# Patient Record
Sex: Female | Born: 1935 | Race: Black or African American | Hispanic: No | Smoking: Never smoker
Health system: Southern US, Community
[De-identification: ages and names within clinical notes are randomized; demographics above are authoritative.]

## PROBLEM LIST (undated history)

## (undated) DIAGNOSIS — K589 Irritable bowel syndrome without diarrhea: Secondary | ICD-10-CM

## (undated) DIAGNOSIS — N289 Disorder of kidney and ureter, unspecified: Secondary | ICD-10-CM

## (undated) DIAGNOSIS — M1990 Unspecified osteoarthritis, unspecified site: Secondary | ICD-10-CM

## (undated) DIAGNOSIS — C801 Malignant (primary) neoplasm, unspecified: Secondary | ICD-10-CM

## (undated) DIAGNOSIS — F419 Anxiety disorder, unspecified: Secondary | ICD-10-CM

## (undated) DIAGNOSIS — I10 Essential (primary) hypertension: Secondary | ICD-10-CM

## (undated) DIAGNOSIS — F411 Generalized anxiety disorder: Secondary | ICD-10-CM

## (undated) HISTORY — DX: Anxiety disorder, unspecified: F41.9

## (undated) HISTORY — DX: Irritable bowel syndrome, unspecified: K58.9

## (undated) HISTORY — PX: CARDIAC CATHETERIZATION: SHX172

## (undated) HISTORY — DX: Unspecified osteoarthritis, unspecified site: M19.90

## (undated) HISTORY — PX: ABDOMINAL HYSTERECTOMY: SHX81

---

## 2001-04-04 ENCOUNTER — Ambulatory Visit (HOSPITAL_COMMUNITY): Admission: RE | Admit: 2001-04-04 | Discharge: 2001-04-04 | Payer: Self-pay | Admitting: Ophthalmology

## 2002-10-15 ENCOUNTER — Ambulatory Visit (HOSPITAL_COMMUNITY): Admission: RE | Admit: 2002-10-15 | Discharge: 2002-10-15 | Payer: Self-pay | Admitting: *Deleted

## 2002-10-15 ENCOUNTER — Encounter: Payer: Self-pay | Admitting: Internal Medicine

## 2002-10-31 ENCOUNTER — Encounter: Payer: Self-pay | Admitting: Internal Medicine

## 2002-10-31 ENCOUNTER — Ambulatory Visit (HOSPITAL_COMMUNITY): Admission: RE | Admit: 2002-10-31 | Discharge: 2002-10-31 | Payer: Self-pay | Admitting: Internal Medicine

## 2002-12-11 ENCOUNTER — Other Ambulatory Visit: Admission: RE | Admit: 2002-12-11 | Discharge: 2002-12-11 | Payer: Self-pay | Admitting: Ophthalmology

## 2003-03-12 ENCOUNTER — Ambulatory Visit (HOSPITAL_COMMUNITY): Admission: RE | Admit: 2003-03-12 | Discharge: 2003-03-12 | Payer: Self-pay | Admitting: Internal Medicine

## 2003-03-12 ENCOUNTER — Encounter: Payer: Self-pay | Admitting: Internal Medicine

## 2003-11-27 ENCOUNTER — Encounter (HOSPITAL_COMMUNITY): Admission: RE | Admit: 2003-11-27 | Discharge: 2003-11-28 | Payer: Self-pay | Admitting: Internal Medicine

## 2003-12-11 ENCOUNTER — Inpatient Hospital Stay (HOSPITAL_COMMUNITY): Admission: EM | Admit: 2003-12-11 | Discharge: 2003-12-13 | Payer: Self-pay | Admitting: Emergency Medicine

## 2004-03-23 ENCOUNTER — Ambulatory Visit (HOSPITAL_COMMUNITY): Admission: RE | Admit: 2004-03-23 | Discharge: 2004-03-23 | Payer: Self-pay | Admitting: Ophthalmology

## 2004-10-13 ENCOUNTER — Ambulatory Visit (HOSPITAL_COMMUNITY): Admission: RE | Admit: 2004-10-13 | Discharge: 2004-10-13 | Payer: Self-pay | Admitting: Internal Medicine

## 2005-06-18 ENCOUNTER — Ambulatory Visit (HOSPITAL_COMMUNITY): Admission: RE | Admit: 2005-06-18 | Discharge: 2005-06-18 | Payer: Self-pay | Admitting: Internal Medicine

## 2006-12-21 ENCOUNTER — Ambulatory Visit (HOSPITAL_COMMUNITY): Admission: RE | Admit: 2006-12-21 | Discharge: 2006-12-21 | Payer: Self-pay | Admitting: Internal Medicine

## 2007-04-01 ENCOUNTER — Emergency Department (HOSPITAL_COMMUNITY): Admission: EM | Admit: 2007-04-01 | Discharge: 2007-04-01 | Payer: Self-pay | Admitting: Emergency Medicine

## 2008-06-25 ENCOUNTER — Ambulatory Visit (HOSPITAL_COMMUNITY): Admission: RE | Admit: 2008-06-25 | Discharge: 2008-06-25 | Payer: Self-pay | Admitting: Internal Medicine

## 2010-11-07 ENCOUNTER — Encounter: Payer: Self-pay | Admitting: Internal Medicine

## 2011-01-11 ENCOUNTER — Emergency Department (HOSPITAL_COMMUNITY)
Admission: EM | Admit: 2011-01-11 | Discharge: 2011-01-11 | Disposition: A | Discharge status: Home / Self Care | Payer: Medicare Other | Attending: Emergency Medicine | Admitting: Emergency Medicine

## 2011-01-11 DIAGNOSIS — I10 Essential (primary) hypertension: Secondary | ICD-10-CM | POA: Insufficient documentation

## 2011-01-11 DIAGNOSIS — R109 Unspecified abdominal pain: Secondary | ICD-10-CM | POA: Insufficient documentation

## 2011-01-11 DIAGNOSIS — K5289 Other specified noninfective gastroenteritis and colitis: Secondary | ICD-10-CM | POA: Insufficient documentation

## 2011-01-11 LAB — DIFFERENTIAL
Eosinophils Absolute: 0 10*3/uL (ref 0.0–0.7)
Eosinophils Relative: 0 % (ref 0–5)
Lymphs Abs: 0.9 10*3/uL (ref 0.7–4.0)
Monocytes Absolute: 0.2 10*3/uL (ref 0.1–1.0)
Monocytes Relative: 3 % (ref 3–12)

## 2011-01-11 LAB — CBC
MCH: 30.3 pg (ref 26.0–34.0)
MCV: 91 fL (ref 78.0–100.0)
Platelets: 296 10*3/uL (ref 150–400)
RDW: 13.6 % (ref 11.5–15.5)

## 2011-01-11 LAB — BASIC METABOLIC PANEL
BUN: 15 mg/dL (ref 6–23)
Creatinine, Ser: 0.81 mg/dL (ref 0.4–1.2)
GFR calc non Af Amer: >60 mL/min (ref >60)

## 2011-03-05 NOTE — H&P (Signed)
NAME:  Faith Lee, Faith Lee                     ACCOUNT NO.:  [id number]   MEDICAL RECORD NO.:  [id number]                   PATIENT TYPE:  INP   LOCATION:  IC10                                 FACILITY:  [facility]   PHYSICIAN:  Edward L. Luan Pulling, M.D.             DATE OF BIRTH:  Apr 21, 1931   DATE OF ADMISSION:  12/11/2003  DATE OF DISCHARGE:                                HISTORY & PHYSICAL   REASON FOR ADMISSION:  Chest discomfort.   HISTORY OF PRESENT ILLNESS:  Faith Lee is a 77 year old patient of Dr.  Ria Comment who has not had any previous history of any sort of heart disease,  but who had a stress test done about two weeks ago which showed what  appeared to be some inferior ischemia.  She says that she was in her usual  state of normal health when in the night last night prior to admission, she  developed increased heart rate and a feeling of fullness which she does not  describe as a pain.  She did say that she had more stress and anxiety than  normal yesterday.  She took aspirin and antiacid.  This did not help.  She  said that her blood pressure was about 170.  Pulse was about 160.  She  stayed up worried about this through most of the night and eventually came  to the emergency room.  When the EMS service came to get her, she had  nitroglycerin x1 and aspirin, and the sensation was relieved.  She has a  previous history of hypertension and anxiety.  She says that occasionally  her potassium level gets low.  She does have a history of irritable bowel  syndrome as well.  There is mentioned a cardiac catheterization 13 years ago  at [facility], but apparently there is no report on that as yet.   SOCIAL HISTORY:  She does not smoke.  She does not drink any alcohol.  She  lives at home with her husband.   FAMILY HISTORY:  A very strong history of coronary disease.  Her mother died  in her 39's of congestive heart failure.  Her father had myocardial  infarction.   REVIEW OF SYSTEMS:   Except as mentioned, is negative.   PHYSICAL EXAMINATION:  VITAL SIGNS:  Blood pressure 121/57, pulse 78,  temperature 97.5, respirations 20.  HEENT:  Pupils are equal, round and reactive to light and accommodation.  Nose and throat are clear.  NECK:  Supple without masses.  CHEST:  Clear without wheezes.  HEART:  Regular.  ABDOMEN:  Soft.  EXTREMITIES:  Showed no edema.  NEUROLOGIC:  CNS examination is grossly intact.   EKG shows nonspecific ST-T wave abnormalities.   LABORATORY DATA:  White count 7600, hemoglobin 13, platelets 323,  electrolytes are normal.  BUN 7, creatinine 0.9.   Cardiac enzymes, CK 157, MB of 2.6, troponin of 0.05.  Clotting is normal.  There is  a junctional tachycardia on her EKG.   ASSESSMENT:  She has chest pain which is not entirely typical, but certainly  she had markedly-elevated heart rate and now has what looks like a  junctional tachycardia, an abnormal graded exercise test, a strong  postoperative family history of coronary disease and hypertension.   PLAN:  The plan is for her to be set up for a probable cardiac  catheterization and possibly EP evaluation as well.     ___________________________________________                                         Jasper Loser. Luan Pulling, M.D.   Marjean Donna  D:  12/11/2003  T:  12/11/2003  Job:  [id number]

## 2011-03-05 NOTE — Cardiovascular Report (Signed)
NAME:  Faith Lee, Faith Lee                     ACCOUNT NO.:  [id number]   MEDICAL RECORD NO.:  [id number]                   PATIENT TYPE:  INP   LOCATION:  [id number]                                 FACILITY:  [facility]   PHYSICIAN:  Eustace Quail, M.D.                  DATE OF BIRTH:  1931-10-10   DATE OF PROCEDURE:  12/12/2003  DATE OF DISCHARGE:  12/13/2003                              CARDIAC CATHETERIZATION   CLINICAL HISTORY:  [name] is 77 years old and was admitted through  [facility] Emergency Room with chest pain and palpitations.  On admission,  she was found to have an SVT with a rate of about 130 with no evidence P  waves.  This converted after a number of hours and she was scheduled for  evaluation with angiography here.  She had a previous Cardiolite scan  performed on November 28, 2003 which showed an ejection fraction of 72%, no  ischemia and inferior lateral ST changes.   PROCEDURE:  The procedure was performed via the right femoral artery using  arterial sheath and 6 French preformed coronary catheters. A front wall  arterial puncture was performed and Omnipaque contrast was used.  A distal  aortogram was performed to rule out renal vascular causes for her  hypertension.  The patient tolerated the procedure well and left the  laboratory in satisfactory condition.   RESULTS:  Left main coronary artery:  The left main coronary was free of  significant disease.   Left anterior descending artery:  The left anterior descending artery gave  rise to two diagonal branches and a septal perforator.  These and the LAD  proper are free of significant disease.   Circumflex artery:  The circumflex artery gave rise to a large ramus branch,  marginal branch and a posterior lateral branch.  These vessels were free of  significant disease.   Right coronary artery:  The right coronary artery was a small vessel that  gave rise to a right ventricular branch, posterior descending branch  and a  very small posterior lateral branch.  These vessels were free of significant  disease.   LEFT VENTRICULOGRAM:  The left ventriculogram performed in the RAO  projection showed good wall motion with no areas of hypokinesis.  The  estimated ejection fraction was 60%.   DISTAL AORTOGRAM:  Distal aortogram was performed which showed patent renal  arteries and no significant aortoiliac obstruction.  There were two renal  arteries on the right.   The aortic pressure was 148/75, mean 104.  Left ventricular pressure was  148/14.   CONCLUSIONS:  Normal coronary angiography and left ventricular wall motion.   RECOMMENDATIONS:  Reassurance.  In view of these findings, I suspect that  most of the patient's symptoms are related to her SVT.  Will plan treatment  with beta blockers and if she has recurrence will consider ablation if the  rhythm appears suitable  for that.                                               Eustace Quail, M.D.    BB/MEDQ  D:  12/12/2003  T:  12/14/2003  Job:  [id number]   cc:   Paula Compton. Willey Blade, M.D.  [address]  [place] [address]  Fax: [place] Luan Pulling, M.D.  [address]  [place]  [place] [address]  Fax: [phone]   Scarlett Presto, M.D.  Fax: [phone]

## 2011-03-05 NOTE — Consult Note (Signed)
NAME:  Faith Lee, Faith Lee                     ACCOUNT NO.:  [id number]   MEDICAL RECORD NO.:  [id number]                   PATIENT TYPE:  INP   LOCATION:  IC10                                 FACILITY:  [facility]   PHYSICIAN:  Scarlett Presto, M.D.                DATE OF BIRTH:  [birth year]   DATE OF CONSULTATION:  DATE OF DISCHARGE:                                   CONSULTATION   HISTORY OF PRESENT ILLNESS:  [name] is a 77 year old woman with a  history of hypertension and a question of hyperlipidemia who presents today  with long-standing chest discomfort which suddenly worsened over the last  couple of hours associated with palpitations.  She presented to the ER.  The  onset of his symptoms was about 10:30 at rest last night.  She noted the  increased heart rate, fullness in her chest.  She thought it probably would  go away over time as most of her other episodes do.  However, she was  worried about this also being related to the second anniversary of the death  of her son.  So, at that point, she presented to the ER, was evaluated,  given some nitroglycerin with resolution of the discomfort.  Found on EKG in  the ER to have what looks to be junctional tachycardia. This also resolved  with the nitroglycerin.  She was given aspirin, and we were asked to see the  patient.  She is currently pain free, feels well.  Her heart rate is in the  80s and sinus currently.   MEDICATIONS:  1. Lorazepam which she took on a p.r.n. basis.  2. Hydrochlorothiazide.  3. Triamterene 37.5/25 once daily.  4. Potassium chloride 10 mEq daily.  5. Benicar at a dose she is not certain of.   PAST MEDICAL HISTORY:  1. Irritable bowel syndrome.  2. Anxiety.  3. Hypertension.  4. Heart catheterization about 13 years ago at [facility].  We have no records, but she tells me that she had no evidence     of coronary disease.   Interestingly, she was evaluated by Dr. Willey Blade last  week with an exercise  Cardiolite which she exercised only about 3 minutes, had ST segment  depression in the inferior lateral leads which was diagnostic, and had  perfusion study which showed no ischemia and a normal ejection fraction.  Review of those films by me shows that there may be some evidence of  anterior reversible defect which was interpreted as breast attenuation.  However, it appears more likely to be ischemia.   SOCIAL HISTORY:  She lives in [place] with her husband.  She  is married.  She has one daughter and one son who is deceased secondary to  colon cancer.  Her daughter is healthy, and I met her at the patient's  bedside.  She does not smoke cigarettes, does not  drink alcohol, does not  use drugs.  Is not on a diet.   FAMILY HISTORY:  Her mother died at age 22 of congestive heart failure.  Father died at age 56 of a myocardial infarction, and died suddenly cardiac  __________.  She has three brothers and three sisters, none of them have  coronary disease.   REVIEW OF SYSTEMS:  She does have the situational anxiety related to the  anniversary of the death of her son.  She does have some mild PND and  orthopnea which occur relatively rarely.  She has nausea, frequently  associated with her irritable bowel syndrome, otherwise, her review of  systems is negative.   PHYSICAL EXAMINATION:  GENERAL APPEARANCE:  She is an absolutely delightful,  articular African American female in no apparent distress.  She was alert  and oriented x4.  VITAL SIGNS:  Heart rate is 76 and regular in sinus, respiratory rate 20,  blood pressure 121/57.  HEENT:  Unremarkable.  NECK:  Supple with no jugular venous distention or carotid bruits.  CHEST:  Clear to auscultation.  CARDIAC:  Nondisplaced point of maximum impulse with no lifts or thrills.  First and second heart sound are normal.  There is no third or fourth heart  sound.  No murmurs are noted.  ABDOMEN:  Soft,  nontender, normoactive bowel sounds.  GU/RECTAL:  Deferred.  EXTREMITIES:  Without significant cyanosis, clubbing or edema.  She has 2+  pulses throughout.  No femoral bruits.  MUSCULOSKELETAL:  Unrevealing.  NEUROLOGICAL:  Nonfocal.  BREAST:  Deferred.   CHEST X-RAY:  Question of cardiomegaly, but otherwise, no acute disease.   ELECTROCARDIOGRAM:  First electrocardiogram shows junctional tachycardia  rate of 133 with normal axis, nonspecific ST-T wave changes with evidence of  ST segment depression in the anterolateral leads.  No evidence of left  ventricular hypertrophy.  Repeat EKG after the nitroglycerin shows sinus  rhythm with left atrial enlargement, evidence of poor R wave progression  across the anterior precordium, nonspecific ST-T wave changes, question of  an old inferior wall MI was poor quality study.   LABORATORY DATA:  White blood cell count 7.6, H&H 13 and 39, platelets  323,000.  Sodium 139, potassium 3.7, chloride 102, bicarbonate 31, BUN 7,  creatinine 0.9, blood sugar 112.  Single set of cardiac enzymes:  CK 157, MB  2.6, troponin 0.05, PTT 34, INR 1.2, PT 14.4.   ASSESSMENT:  1. Chest pain which is atypical with a recent submaximal stress test which     is nondiagnostic but concerning.  2. Junctional tachycardia with chest pain and ST segment depression.  3. Hypertension.  4. Coronary artery disease family history.   PLAN:  My plan is to put this patient on aspirin and Lovenox.  I think it is  probably reasonable to transfer her to [facility] for heart  catheterization.  If this is unrevealing, I think an electrophysiology  evaluation would be reasonable.  She probably needs a statin agent along  with her aspirin and Lovenox.  I think at this point, her blood pressure  appears to be well controlled.  I would not add any beta blocker or ACE  inhibitor at this time.  I will just leave her on her  hydrochlorothiazide.    ___________________________________________  Scarlett Presto, M.D.   JH/MEDQ  D:  12/11/2003  T:  12/12/2003  Job:  [id number]

## 2011-03-05 NOTE — Group Therapy Note (Signed)
NAME:  Faith Lee, Faith Lee                     ACCOUNT NO.:  [id number]   MEDICAL RECORD NO.:  [id number]                   PATIENT TYPE:  OUT   LOCATION:  RAD                                  FACILITY:  [facility]   PHYSICIAN:  Paula Compton. Willey Blade, M.D.                  DATE OF BIRTH:  07-22-1931   DATE OF PROCEDURE:  DATE OF DISCHARGE:                                   PROGRESS NOTE   HISTORY OF PRESENT ILLNESS:  [name] exercised 3 minutes, 32 seconds (32  seconds in stage II of the Bruce protocol) sustaining a maximal heart rate  of 165 (110% of the age predicted maximal heart rate) at a work load of 4.6  mets and discontinued exercise due to fatigue.  There were no symptoms of  chest pain.  There were no arrhythmias.  There was 1 mm downsloping ST  segment depression noted in the inferolateral leads.  The baseline EKG  revealed normal sinus rhythm/sinus tachycardia at 102 beats per minute.   IMPRESSION:  Evidence of inferolateral ischemia.  Cardiolite images pending.      ___________________________________________                                            Paula Compton. Willey Blade, M.D.   ROF/MEDQ  D:  11/27/2003  T:  11/27/2003  Job:  [id number]

## 2011-03-05 NOTE — Procedures (Signed)
NAME:  Faith Lee, Faith Lee                     ACCOUNT NO.:  [id number]   MEDICAL RECORD NO.:  [id number]                   PATIENT TYPE:  INP   LOCATION:  IC10                                 FACILITY:  [facility]   PHYSICIAN:  Edward L. Luan Pulling, M.D.             DATE OF BIRTH:  1931/05/21   DATE OF PROCEDURE:  DATE OF DISCHARGE:                                EKG INTERPRETATION   PROCEDURE #1:  Electrocardiogram December 11, 2003, at 1304.   FINDINGS:  1. The rhythm appears to be sinus rhythm with a rate in the 70's.  2. There is probable left atrial enlargement.  3. Slow R wave progression across the precordium may indicate a previous     anterior myocardial infarction.  4. There are T wave abnormalities which are diffuse but nonspecific.   IMPRESSION:  Abnormal electrocardiogram.   PROCEDURE #2:  Electrocardiogram, December 11, 2003, at 0926.   FINDINGS:  1. The rhythm is what look like a junctional tachycardia with a rate of     about 130.  2. There are ST-T wave abnormalities which are diffuse but nonspecific.      ___________________________________________                                            Jasper Loser. Luan Pulling, M.D.   Marjean Donna  D:  12/11/2003  T:  12/12/2003  Job:  [id number]

## 2011-03-05 NOTE — Discharge Summary (Signed)
NAMEKIMM, HYNEK                       ACCOUNT NO.:  [id number]   MEDICAL RECORD NO.:  [id number]                  PATIENT TYPE:   LOCATION:                                       FACILITY:   PHYSICIAN:  Paula Compton. Willey Blade, M.D.                  DATE OF BIRTH:   DATE OF ADMISSION:  DATE OF DISCHARGE:                                 DISCHARGE SUMMARY   DISCHARGE DIAGNOSES:  1. Chest pain.  2. Junctional tachycardia.  3. Hypertension.  4. Anxiety.   HOSPITAL COURSE:  This patient is a 77 year old African American female who  presented with palpitations and atypical chest pain.  She was found to be in  a junctional tachycardia initially.  She converted to a normal sinus rhythm.  Her cardiac enzymes were normal with a CK of 157 and a troponin I of 0.05.  Her electrolytes were normal.  Her chest x-ray revealed cardiomegaly.  She  was hospitalized in the CCU.  She was seen in cardiology consultation by Dr.  Wilhemina Cash.  She had undergone a negative Cardiolite stress test last week, but  had limited exercise tolerance.  The decision was made to transfer her to  [facility] for cardiac catheterization and further evaluation.   MEDICATIONS:  Her home medications had been:  1. Benicar 20 mg daily.  2. Hydrochlorothiazide 25 mg daily.  3. KCl 10 mEq daily.  4. Lorazepam 1 mg 1/2 to 1 t.i.d. p.r.n.   Since hospitalization, Lovenox was added at 1 mg/kg subcu q.12h.     ___________________________________________                                         Paula Compton. Willey Blade, M.D.   ROF/MEDQ  D:  12/12/2003  T:  12/12/2003  Job:  [id number]

## 2011-03-05 NOTE — Discharge Summary (Signed)
NAME:  Faith Lee, Faith Lee                     ACCOUNT NO.:  [id number]   MEDICAL RECORD NO.:  [id number]                   PATIENT TYPE:  INP   LOCATION:  6533                                 FACILITY:  [facility]   PHYSICIAN:  Junious Silk, M.D. [facility]         DATE OF BIRTH:  [birth year]   DATE OF ADMISSION:  12/12/2003  DATE OF DISCHARGE:  12/13/2003                           DISCHARGE SUMMARY - REFERRING   DISCHARGE DIAGNOSES:  1. Chest pain felt to be noncardiac.  2. Shoulder pain thought to be musculoskeletal in nature.  3. Hypertension under medical therapy.   HOSPITAL COURSE:  Ms. Burgen is a 77 year old female with known history of  coronary artery disease who presented to [facility] ER with complaints of  chest pain and palpitations.  She self medicated herself with aspirin and  antacid without relief.  Blood pressure at home was 170 with pulse of 158.  This occurred during the entire night, and around 5 a.m., she called EMS.  She had a stress Cardiolite on November 28, 2003, which revealed an EF of  72% with normal wall motion, no ischemia, mild breast attenuation.  Her EKG  does show inferolateral ischemia with exercise.  For that reason, she was  transferred to [facility] for catheterization.   Her coronaries were normal angiographically.  Her EF was 60% with no wall  motion abnormalities. At this point because of her palpitations, we did  place her on a beta blocker, and we did stop her Benicar.  Hopefully between  the Toprol and Maxzide, her blood pressure will remain stable as it has  during this hospitalization.  She is to resume her potassium.   PHYSICAL EXAMINATION:  VITAL SIGNS:  Upon discharge, her blood pressure is  106/66, pulse 70, O2 saturation 98% on room air.  LUNGS:  Lungs are clear to auscultation bilaterally  HEART:  Regular rate and rhythm with a normal S1 and S2.  S4 is present.  No  peripheral edema.  EXTREMITIES:  Right groin has no bruit, no  hematoma.   LABORATORY DATA:  Essentially, labs have been reviewed by Dr. Vicenta Aly and  are normal.   DISCHARGE MEDICATIONS:  1. Triamterene/HCTZ 37.5/25 one tablet daily.  2. Toprol XL 50 mg a day.  3. Potassium 10 mEq a day.   DISCHARGE INSTRUCTIONS:  1. The patient is to stop Benicar.  2. Tylenol: May utilize 1 to 2 tablets every 6 hours as needed for pain.  3. No strenuous activity or heavy lifting for two days, then gradually     increase activity.  4. Remain on low-fat diet.  5. Clear catheterization site with soap and water, no scrubbing.  6. Call for questions or concerns.  7. She is to follow up with Dr. Wilhemina Cash on December 20, 2003, at 3:30 p.m.  8. She is to follow up with her primary care physician for further workup of     the shoulder pain which we  feel is musculoskeletal in nature.  9. She may utilize Motrin for this.      Joesphine Bare, P.A. [facility]                      Junious Silk, M.D. [facility]    LB/MEDQ  D:  12/13/2003  T:  12/13/2003  Job:  [id number]   cc:   Scarlett Presto, M.D.  Fax: [place] Willey Blade, M.D.  [address]  [place]  [place] [address]  Fax: [phone]

## 2012-01-05 ENCOUNTER — Encounter (HOSPITAL_COMMUNITY): Payer: Self-pay | Admitting: *Deleted

## 2012-01-05 ENCOUNTER — Emergency Department (HOSPITAL_COMMUNITY): Payer: Medicare Other

## 2012-01-05 ENCOUNTER — Other Ambulatory Visit: Payer: Self-pay

## 2012-01-05 ENCOUNTER — Emergency Department (HOSPITAL_COMMUNITY)
Admission: EM | Admit: 2012-01-05 | Discharge: 2012-01-05 | Disposition: A | Discharge status: Home / Self Care | Payer: Medicare Other | Attending: Emergency Medicine | Admitting: Emergency Medicine

## 2012-01-05 DIAGNOSIS — Z9889 Other specified postprocedural states: Secondary | ICD-10-CM | POA: Insufficient documentation

## 2012-01-05 DIAGNOSIS — R209 Unspecified disturbances of skin sensation: Secondary | ICD-10-CM | POA: Insufficient documentation

## 2012-01-05 DIAGNOSIS — Z79899 Other long term (current) drug therapy: Secondary | ICD-10-CM | POA: Insufficient documentation

## 2012-01-05 DIAGNOSIS — R531 Weakness: Secondary | ICD-10-CM

## 2012-01-05 DIAGNOSIS — R110 Nausea: Secondary | ICD-10-CM | POA: Insufficient documentation

## 2012-01-05 DIAGNOSIS — R5381 Other malaise: Secondary | ICD-10-CM | POA: Insufficient documentation

## 2012-01-05 DIAGNOSIS — R109 Unspecified abdominal pain: Secondary | ICD-10-CM | POA: Insufficient documentation

## 2012-01-05 DIAGNOSIS — I10 Essential (primary) hypertension: Secondary | ICD-10-CM | POA: Insufficient documentation

## 2012-01-05 HISTORY — DX: Essential (primary) hypertension: I10

## 2012-01-05 LAB — BASIC METABOLIC PANEL
CO2: 31 mEq/L (ref 19–32)
Calcium: 9.5 mg/dL (ref 8.4–10.5)
GFR calc Af Amer: 75 mL/min — ABNORMAL LOW (ref >90)
GFR calc non Af Amer: 65 mL/min — ABNORMAL LOW (ref >90)
Sodium: 140 mEq/L (ref 135–145)

## 2012-01-05 LAB — CBC
MCV: 91.6 fL (ref 78.0–100.0)
Platelets: 298 10*3/uL (ref 150–400)
RDW: 13.6 % (ref 11.5–15.5)
WBC: 5.5 10*3/uL (ref 4.0–10.5)

## 2012-01-05 LAB — DIFFERENTIAL
Basophils Absolute: 0 10*3/uL (ref 0.0–0.1)
Basophils Relative: 1 % (ref 0–1)
Eosinophils Relative: 2 % (ref 0–5)
Lymphocytes Relative: 44 % (ref 12–46)
Neutro Abs: 2.5 10*3/uL (ref 1.7–7.7)

## 2012-01-05 LAB — POCT I-STAT TROPONIN I: Troponin i, poc: 0 ng/mL (ref 0.00–0.08)

## 2012-01-05 MED ORDER — ONDANSETRON 8 MG PO TBDP
8.0000 mg | ORAL_TABLET | Freq: Once | ORAL | Status: AC
  Administered 2012-01-05: 8 mg via ORAL
  Filled 2012-01-05: qty 1

## 2012-01-05 NOTE — ED Provider Notes (Signed)
History     CSN: [id number]  Arrival date & time 01/05/12  1155   First MD Initiated Contact with Patient 01/05/12 1210      Chief Complaint  Patient presents with  . Abdominal Pain    (Consider location/radiation/quality/duration/timing/severity/associated sxs/prior treatment) HPI Comments: Patient arrives to the ED by EMS with complaint of upper abdominal pain and numbness to her right face. She states that she was at her dental appointment just before arrival and received an injection of lidocaine prior to a dental cleaning. She states she suddenly became nauseous, and had pain to the right side of her chest that radiated to her upper abdomen. She also reports that her right face and cheek became numb. She denies sweating or vomiting. She also denies any shortness of breath.  Patient is a 78 y.o. female presenting with abdominal pain. The history is provided by the patient. No language interpreter was used.  Abdominal Pain The primary symptoms of the illness include abdominal pain and nausea. The primary symptoms of the illness do not include fever, fatigue, shortness of breath, vomiting, diarrhea, hematemesis, hematochezia or dysuria. Episode onset: Less than one hour prior to ED arrival. The onset of the illness was sudden. The problem has been gradually improving.  Associated with: Associated with an Injection of lidocaine. The patient states that she believes she is currently not pregnant. The patient has not had a change in bowel habit. Risk factors for an acute abdominal problem include being elderly. Symptoms associated with the illness do not include chills, diaphoresis, heartburn, constipation, urgency, hematuria, frequency or back pain.    Past Medical History  Diagnosis Date  . Hypertension     Past Surgical History  Procedure Date  . Abdominal hysterectomy     History reviewed. No pertinent family history.  History  Substance Use Topics  . Smoking status: Never  Smoker   . Smokeless tobacco: Not on file  . Alcohol Use: No    OB History    Grav Para Term Preterm Abortions TAB SAB Ect Mult Living                  Review of Systems  Constitutional: Negative for fever, chills, diaphoresis and fatigue.  HENT: Negative for facial swelling, trouble swallowing and neck pain.   Respiratory: Negative for chest tightness and shortness of breath.   Cardiovascular: Positive for chest pain. Negative for palpitations and leg swelling.  Gastrointestinal: Positive for nausea and abdominal pain. Negative for heartburn, vomiting, diarrhea, constipation, hematochezia, abdominal distention and hematemesis.  Genitourinary: Negative for dysuria, urgency, frequency, hematuria and difficulty urinating.  Musculoskeletal: Negative for back pain.  Skin: Negative.   Neurological: Positive for speech difficulty, weakness, light-headedness and numbness.  All other systems reviewed and are negative.    Allergies  Review of patient's allergies indicates no known allergies.  Home Medications  No current outpatient prescriptions on file.  BP 141/81  Pulse 90  Temp(Src) 97.8 F (36.6 C) (Oral)  Resp 20  SpO2 100%  Physical Exam  Nursing note and vitals reviewed. Constitutional: She is oriented to person, place, and time. She appears well-developed and well-nourished. No distress.  HENT:  Head: Normocephalic and atraumatic.  Mouth/Throat: Oropharynx is clear and moist.       No focal neuro deficits to her face. No facial drooping.  Eyes: EOM are normal. Pupils are equal, round, and reactive to light.  Neck: Normal range of motion. Neck supple.  Cardiovascular: Normal rate, regular rhythm,  normal heart sounds and intact distal pulses.   No murmur heard. Pulmonary/Chest: Effort normal and breath sounds normal. No respiratory distress.  Abdominal: Soft. Bowel sounds are normal. She exhibits no distension. There is no hepatosplenomegaly. There is no tenderness.  There is no rebound, no guarding and no CVA tenderness.  Musculoskeletal: Normal range of motion. She exhibits no edema and no tenderness.  Lymphadenopathy:    She has no cervical adenopathy.  Neurological: She is alert and oriented to person, place, and time. She is not disoriented. No cranial nerve deficit or sensory deficit. She exhibits normal muscle tone. Coordination normal. GCS eye subscore is 4. GCS verbal subscore is 5. GCS motor subscore is 6.  Reflex Scores:      Tricep reflexes are 2+ on the right side and 2+ on the left side.      Bicep reflexes are 2+ on the right side and 2+ on the left side.      Brachioradialis reflexes are 2+ on the right side and 2+ on the left side.      Patellar reflexes are 2+ on the right side and 2+ on the left side.      Achilles reflexes are 2+ on the right side and 2+ on the left side.      Speech is slow without slurring.  Answers questions appropriately  Skin: Skin is warm and dry.    ED Course  Procedures (including critical care time)  Results for orders placed during the hospital encounter of 01/05/12  CBC      Component Value Range   WBC 5.5  4.0 - 10.5 (K/uL)   RBC 4.42  3.87 - 5.11 (MIL/uL)   Hemoglobin 13.2  12.0 - 15.0 (g/dL)   HCT 40.5  36.0 - 46.0 (%)   MCV 91.6  78.0 - 100.0 (fL)   MCH 29.9  26.0 - 34.0 (pg)   MCHC 32.6  30.0 - 36.0 (g/dL)   RDW 13.6  11.5 - 15.5 (%)   Platelets 298  150 - 400 (K/uL)  DIFFERENTIAL      Component Value Range   Neutrophils Relative 45  43 - 77 (%)   Neutro Abs 2.5  1.7 - 7.7 (K/uL)   Lymphocytes Relative 44  12 - 46 (%)   Lymphs Abs 2.4  0.7 - 4.0 (K/uL)   Monocytes Relative 8  3 - 12 (%)   Monocytes Absolute 0.5  0.1 - 1.0 (K/uL)   Eosinophils Relative 2  0 - 5 (%)   Eosinophils Absolute 0.1  0.0 - 0.7 (K/uL)   Basophils Relative 1  0 - 1 (%)   Basophils Absolute 0.0  0.0 - 0.1 (K/uL)  BASIC METABOLIC PANEL      Component Value Range   Sodium 140  135 - 145 (mEq/L)   Potassium 3.9   3.5 - 5.1 (mEq/L)   Chloride 101  96 - 112 (mEq/L)   CO2 31  19 - 32 (mEq/L)   Glucose, Bld 99  70 - 99 (mg/dL)   BUN 15  6 - 23 (mg/dL)   Creatinine, Ser 0.84  0.50 - 1.10 (mg/dL)   Calcium 9.5  8.4 - 10.5 (mg/dL)   GFR calc non Af Amer 65 (*) >90 (mL/min)   GFR calc Af Amer 75 (*) >90 (mL/min)  POCT I-STAT TROPONIN I      Component Value Range   Troponin i, poc 0.00  0.00 - 0.08 (ng/mL)   Comment 3  Ct Head Wo Contrast  01/05/2012  *RADIOLOGY REPORT*  Clinical Data: Facial numbness  CT HEAD WITHOUT CONTRAST  Technique:  Contiguous axial images were obtained from the base of the skull through the vertex without contrast.  Comparison: None.  Findings: The brain shows mild age related atrophy.  There is no evidence of focal or acute infarction, mass lesion, hemorrhage, hydrocephalus or extra-axial collection.  There is atherosclerotic calcification of the major vessels at the base of the brain.  The calvarium is unremarkable.  Sinuses are clear.  IMPRESSION: No acute or focal finding.  Mild age related atrophy.  Original Report Authenticated By: Jules Schick, M.D.   Dg Chest Portable 1 View  01/05/2012  *RADIOLOGY REPORT*  Clinical Data: Abdominal pain  PORTABLE CHEST - 1 VIEW  Comparison: Chest radiograph 12/11/2003  Findings: Normal mediastinum and cardiac silhouette.  Normal pulmonary  vasculature.  No evidence of effusion, infiltrate, or pneumothorax.  No acute bony abnormality.  IMPRESSION: No acute cardiopulmonary process.  Original Report Authenticated By: Suzy Bouchard, M.D.     MDM     Date: 01/05/2012  Rate: 83  Rhythm: normal sinus rhythm  QRS Axis: normal  Intervals: normal  ST/T Wave abnormalities: normal  Conduction Disutrbances:none  Narrative Interpretation: NSR with mild left atrial enlargement  Old EKG Reviewed: none available   EKG reviewed by Dr. Olin Hauser    Patient was observed in the emergency department. She is feeling better. Abdomen  remains soft and nontender. No focal neuro deficits on her exam she moves all extremities without difficulty. Facial numbness is thought to be related to the dental block she received prior to ED arrival.  Other symptoms are likely related to a medication reaction.  I evaluated imaging and laboratory studies performed today and appear to be within normal limits.   Patient was also seen and evaluated by the EDP. Care plan was discussed. Patient agrees to close followup with her primary care physician in 1-2 days or to return here if her symptoms worsen.  Patient / Family / Caregiver understand and agree with initial ED impression and plan with expectations set for ED visit. Pt stable in ED with no significant deterioration in condition. Pt feels improved after observation and/or treatment in ED.    Siraj Dermody L. [place], [place] 01/08/12 1736

## 2012-01-05 NOTE — ED Notes (Signed)
MD at bedside. 

## 2012-01-05 NOTE — ED Notes (Signed)
Pt returned from xray, pt c/o " still Not feeling good", pt updated on plan of care.

## 2012-01-05 NOTE — Discharge Instructions (Signed)
Clear Liquid Diet The clear liquid dietconsists of foods that are liquid or will become liquid at room temperature.You should be able to see through the liquid and beverages. Examples of foods allowed on a clear liquid diet include fruit juice, broth or bouillon, gelatin, or frozen ice pops. The purpose of this diet is to provide necessary fluid, electrolytes such as sodium and potassium, and energy to keep the body functioning during times when you are not able to consume a regular diet.A clear liquid diet should not be continued for long periods of time as it is not nutritionally adequate.  REASONS FOR USING A CLEAR LIQUID DIET  In sudden onset (acute) conditions for a patient before or after surgery.   As the first step in oral feeding.   For fluid and electrolyte replacement in diarrheal diseases.   As a diet before certain medical tests are performed.  ADEQUACY The clear liquid diet is adequate only in ascorbic acid, according to the Recommended Dietary Allowances of the National Research Council. CHOOSING FOODS Breads and Starches  Allowed:  None are allowed.   Avoid: All are avoided.  Vegetables  Allowed:  Strained tomato or vegetable juice.   Avoid: Any others.  Fruit  Allowed:  Strained fruit juices and fruit drinks. Include 1 serving of citrus or vitamin C-enriched fruit juice daily.   Avoid: Any others.  Meat and Meat Substitutes  Allowed:  None are allowed.   Avoid: All are avoided.  Milk  Allowed:  None are allowed.   Avoid: All are avoided.  Soups and Combination Foods  Allowed:  Clear bouillon, broth, or strained broth-based soups.   Avoid: Any others.  Desserts and Sweets  Allowed:  Sugar, honey. High protein gelatin. Flavored gelatin, ices, or frozen ice pops that do not contain milk.   Avoid: Any others.  Fats and Oils  Allowed:  None are allowed.   Avoid: All are avoided.  Beverages  Allowed: Cereal beverages, coffee (regular or  decaffeinated), tea, or soda at the discretion of your caregiver.   Avoid: Any others.  Condiments  Allowed:  Iodized salt.   Avoid: Any others, including pepper.  Supplements  Allowed:  Liquid nutrition beverages.   Avoid: Any others that contain lactose or fiber.  SAMPLE MEAL PLAN Breakfast  4 oz (120 mL) strained orange juice.    to 1 cup (125 to 250 mL) gelatin (plain or fortified).   1 cup (250 mL) beverage (coffee or tea).   Sugar, if desired.  Midmorning Snack   cup (125 mL) gelatin (plain or fortified).  Lunch  1 cup (250 mL) broth or consomm.   4 oz (120 mL) strained grapefruit juice.    cup (125 mL) gelatin (plain or fortified).   1 cup (250 mL) beverage (coffee or tea).   Sugar, if desired.  Midafternoon Snack   cup (125 mL) fruit ice.    cup (125 mL) strained fruit juice.  Dinner  1 cup (250 mL) broth or consomm.    cup (125 mL) cranberry juice.    cup (125 mL) flavored gelatin (plain or fortified).   1 cup (250 mL) beverage (coffee or tea).   Sugar, if desired.  Evening Snack  4 oz (120 mL) strained apple juice (vitamin C-fortified).    cup (125 mL) flavored gelatin (plain or fortified).  Document Released: 10/04/2005 Document Revised: 09/23/2011 Document Reviewed: 01/01/2011 [employer] Patient Information 2012 [employer]. 

## 2012-01-05 NOTE — ED Notes (Signed)
Pt called brother for ride home. Brother to come pick pt up.

## 2012-01-05 NOTE — ED Notes (Signed)
Pt arrived via ems from dental appt. Per ems dentist injected lidocaine into pt and pt began to have discomfort in epigastric area. Pt describes as nauseated.

## 2012-01-06 NOTE — Progress Notes (Signed)
Patient who presents with numbness and tingling to her right side while being evaluated at the dentist for deep cleaning of her teeth. Became dizzy, nauseated. Felt right hand and arm were numb in addition to her right face which had been numbed by the dentist for cleaning. Was unable to speak for several seconds when asked questions. Does not know if she passed out.   PE: Right facial droop due to dental numbing. Speech only slightly affected by numbing. Right side strength equal to left. No focal findings.  CT negative. Patient stable for discharge home.

## 2012-01-08 NOTE — Progress Notes (Deleted)
Patient with near syncopal episode while in the dentist office associated with nausea, numbness and difficulty talking. CT negative. PE: Cor: RRR, Chest: clear all fields, Neuro: no focal findings except right facial numbness from the anesthesia given at the dentist.

## 2012-01-08 NOTE — ED Provider Notes (Signed)
Medical screening examination/treatment/procedure(s) were performed by non-physician practitioner and as supervising physician I was immediately available for consultation/collaboration.  Gypsy Balsam. Olin Hauser, MD 01/08/12 [id number]

## 2012-01-18 ENCOUNTER — Other Ambulatory Visit (HOSPITAL_COMMUNITY): Payer: Self-pay | Admitting: Internal Medicine

## 2012-01-18 DIAGNOSIS — Z139 Encounter for screening, unspecified: Secondary | ICD-10-CM

## 2012-01-24 ENCOUNTER — Ambulatory Visit (HOSPITAL_COMMUNITY): Payer: Medicare Other

## 2012-05-05 ENCOUNTER — Ambulatory Visit (HOSPITAL_COMMUNITY)
Admission: RE | Admit: 2012-05-05 | Discharge: 2012-05-05 | Disposition: A | Discharge status: Home / Self Care | Payer: Medicare Other | Source: Ambulatory Visit | Attending: Internal Medicine | Admitting: Internal Medicine

## 2012-05-05 DIAGNOSIS — Z139 Encounter for screening, unspecified: Secondary | ICD-10-CM

## 2012-05-05 DIAGNOSIS — Z1231 Encounter for screening mammogram for malignant neoplasm of breast: Secondary | ICD-10-CM | POA: Insufficient documentation

## 2014-04-18 ENCOUNTER — Other Ambulatory Visit (HOSPITAL_COMMUNITY): Payer: Self-pay | Admitting: Internal Medicine

## 2014-04-18 DIAGNOSIS — Z139 Encounter for screening, unspecified: Secondary | ICD-10-CM

## 2014-05-17 ENCOUNTER — Ambulatory Visit (HOSPITAL_COMMUNITY): Payer: Medicare Other

## 2014-05-23 ENCOUNTER — Emergency Department (HOSPITAL_COMMUNITY)
Admission: EM | Admit: 2014-05-23 | Discharge: 2014-05-23 | Disposition: A | Discharge status: Home / Self Care | Payer: Medicare Other | Attending: Emergency Medicine | Admitting: Emergency Medicine

## 2014-05-23 ENCOUNTER — Encounter (HOSPITAL_COMMUNITY): Payer: Self-pay | Admitting: Emergency Medicine

## 2014-05-23 ENCOUNTER — Emergency Department (HOSPITAL_COMMUNITY): Payer: Medicare Other

## 2014-05-23 DIAGNOSIS — R112 Nausea with vomiting, unspecified: Secondary | ICD-10-CM | POA: Insufficient documentation

## 2014-05-23 DIAGNOSIS — I10 Essential (primary) hypertension: Secondary | ICD-10-CM | POA: Diagnosis not present

## 2014-05-23 DIAGNOSIS — R42 Dizziness and giddiness: Secondary | ICD-10-CM | POA: Insufficient documentation

## 2014-05-23 DIAGNOSIS — Z79899 Other long term (current) drug therapy: Secondary | ICD-10-CM | POA: Insufficient documentation

## 2014-05-23 LAB — COMPREHENSIVE METABOLIC PANEL
ALT: 15 U/L (ref 0–35)
ANION GAP: 12 (ref 5–15)
AST: 21 U/L (ref 0–37)
Albumin: 3.6 g/dL (ref 3.5–5.2)
Alkaline Phosphatase: 56 U/L (ref 39–117)
BUN: 13 mg/dL (ref 6–23)
CALCIUM: 9 mg/dL (ref 8.4–10.5)
CO2: 28 meq/L (ref 19–32)
CREATININE: 0.72 mg/dL (ref 0.50–1.10)
Chloride: 97 mEq/L (ref 96–112)
GFR calc Af Amer: >90 mL/min (ref >90)
GFR, EST NON AFRICAN AMERICAN: 78 mL/min — AB (ref >90)
GLUCOSE: 153 mg/dL — AB (ref 70–99)
Potassium: 3.7 mEq/L (ref 3.7–5.3)
SODIUM: 137 meq/L (ref 137–147)
TOTAL PROTEIN: 7.1 g/dL (ref 6.0–8.3)
Total Bilirubin: 0.4 mg/dL (ref 0.3–1.2)

## 2014-05-23 LAB — URINALYSIS, ROUTINE W REFLEX MICROSCOPIC
Bilirubin Urine: NEGATIVE
Glucose, UA: NEGATIVE mg/dL
Hgb urine dipstick: NEGATIVE
Ketones, ur: NEGATIVE mg/dL
LEUKOCYTES UA: NEGATIVE
Nitrite: NEGATIVE
PROTEIN: NEGATIVE mg/dL
Specific Gravity, Urine: <1.005 — ABNORMAL LOW (ref 1.005–1.030)
UROBILINOGEN UA: 0.2 mg/dL (ref 0.0–1.0)
pH: 5.5 (ref 5.0–8.0)

## 2014-05-23 LAB — CBC WITH DIFFERENTIAL/PLATELET
Basophils Absolute: 0 10*3/uL (ref 0.0–0.1)
Basophils Relative: 0 % (ref 0–1)
EOS ABS: 0 10*3/uL (ref 0.0–0.7)
EOS PCT: 0 % (ref 0–5)
HEMATOCRIT: 38.7 % (ref 36.0–46.0)
Hemoglobin: 12.9 g/dL (ref 12.0–15.0)
LYMPHS ABS: 1.5 10*3/uL (ref 0.7–4.0)
Lymphocytes Relative: 19 % (ref 12–46)
MCH: 29.9 pg (ref 26.0–34.0)
MCHC: 33.3 g/dL (ref 30.0–36.0)
MCV: 89.6 fL (ref 78.0–100.0)
MONO ABS: 0.3 10*3/uL (ref 0.1–1.0)
MONOS PCT: 4 % (ref 3–12)
Neutro Abs: 6.3 10*3/uL (ref 1.7–7.7)
Neutrophils Relative %: 77 % (ref 43–77)
PLATELETS: 338 10*3/uL (ref 150–400)
RBC: 4.32 MIL/uL (ref 3.87–5.11)
RDW: 13.5 % (ref 11.5–15.5)
WBC: 8.1 10*3/uL (ref 4.0–10.5)

## 2014-05-23 LAB — TROPONIN I

## 2014-05-23 MED ORDER — ONDANSETRON HCL 4 MG/2ML IJ SOLN
4.0000 mg | Freq: Once | INTRAMUSCULAR | Status: AC
  Administered 2014-05-23: 4 mg via INTRAVENOUS
  Filled 2014-05-23: qty 2

## 2014-05-23 MED ORDER — ONDANSETRON HCL 4 MG PO TABS: 4.0000 mg | ORAL_TABLET | Freq: Four times a day (QID) | ORAL | Status: DC

## 2014-05-23 MED ORDER — MECLIZINE HCL 12.5 MG PO TABS: 12.5000 mg | ORAL_TABLET | Freq: Three times a day (TID) | ORAL | Status: DC | PRN

## 2014-05-23 MED ORDER — GADOBENATE DIMEGLUMINE 529 MG/ML IV SOLN
20.0000 mL | Freq: Once | INTRAVENOUS | Status: AC | PRN
  Administered 2014-05-23: 20 mL via INTRAVENOUS

## 2014-05-23 MED ORDER — MECLIZINE HCL 12.5 MG PO TABS
25.0000 mg | ORAL_TABLET | Freq: Once | ORAL | Status: AC
  Administered 2014-05-23: 25 mg via ORAL
  Filled 2014-05-23: qty 2

## 2014-05-23 MED ORDER — ONDANSETRON HCL 4 MG/2ML IJ SOLN: 4.0000 mg | Freq: Once | INTRAMUSCULAR | Status: DC

## 2014-05-23 MED ORDER — SODIUM CHLORIDE 0.9 % IV BOLUS (SEPSIS)
1000.0000 mL | Freq: Once | INTRAVENOUS | Status: AC
  Administered 2014-05-23: 1000 mL via INTRAVENOUS

## 2014-05-23 MED ORDER — DIAZEPAM 2 MG PO TABS
2.0000 mg | ORAL_TABLET | Freq: Once | ORAL | Status: AC
  Administered 2014-05-23: 2 mg via ORAL
  Filled 2014-05-23: qty 1

## 2014-05-23 NOTE — ED Notes (Signed)
Patient transported to MRI 

## 2014-05-23 NOTE — ED Provider Notes (Signed)
roomCSN: [id number]     Arrival date & time 05/23/14  1610 History   This chart was scribed for Ezequiel Essex, MD by Jeanell Sparrow, ED Scribe. This patient was seen in room APA02/APA02 and the patient's care was started at 4:27 PM.  Chief Complaint  Patient presents with  . Emesis    The history is provided by the patient. No language interpreter was used.   HPI Comments: Faith Lee is a 80 y.o. female with a hx of HTN and IBS who presents to the Emergency Department complaining of moderate intermittent room spinning dizziness that started last night. She reports that the dizziness was worse after waking up this morning. She states that the dizziness is exacerbated by standing up. She reports that she had trouble ambulating today. She states that she has episodes in the past, but not this severe. She states that she had two to three episodes of emesis today. She reports associated nausea. She reports that she has had cataract surgery in both of her eyes. She states that she is not on any blood thinners. She denies any hx of DM, heart stents, MI, or stroke. She also denies any visual disturbance, fever, chest pain, headache, or abdominal pain.  Past Medical History  Diagnosis Date  . Hypertension    Past Surgical History  Procedure Laterality Date  . Abdominal hysterectomy     No family history on file. History  Substance Use Topics  . Smoking status: Never Smoker   . Smokeless tobacco: Not on file  . Alcohol Use: No   OB History   Grav Para Term Preterm Abortions TAB SAB Ect Mult Living                 Review of Systems A complete 10 system review of systems was obtained and all systems are negative except as noted in the HPI and PMH.   Allergies  Lidocaine  Home Medications   Prior to Admission medications   Medication Sig Start Date End Date Taking? Authorizing Provider  hydrochlorothiazide (HYDRODIURIL) 25 MG tablet Take 25 mg by mouth daily.    Historical  Provider, MD  LORazepam (ATIVAN) 1 MG tablet Take 1 mg by mouth 3 (three) times daily as needed.    Historical Provider, MD  losartan (COZAAR) 100 MG tablet Take 100 mg by mouth daily.    Historical Provider, MD  metoprolol succinate (TOPROL-XL) 50 MG 24 hr tablet Take 50 mg by mouth daily. Take with or immediately following a meal.    Historical Provider, MD  potassium chloride (K-DUR) 10 MEQ tablet Take 10 mEq by mouth daily.     Historical Provider, MD  ranitidine (ZANTAC) 150 MG tablet Take 150 mg by mouth daily as needed. For heart burn    Historical Provider, MD   BP 123/55  Pulse 76  Temp(Src) 98 F (36.7 C) (Oral)  Resp 16  Ht 5\' 3"  (1.6 m)  Wt 210 lb (95.255 kg)  BMI 37.21 kg/m2  SpO2 98% Physical Exam  Nursing note and vitals reviewed. Constitutional: She is oriented to person, place, and time. She appears well-developed and well-nourished. No distress.  HENT:  Head: Normocephalic and atraumatic.  Mouth/Throat: Oropharynx is clear and moist. No oropharyngeal exudate.  Eyes: Conjunctivae and EOM are normal. Pupils are equal, round, and reactive to light.  Neck: Normal range of motion. Neck supple.  No meningismus.  Cardiovascular: Normal rate, regular rhythm, normal heart sounds and intact distal pulses.  No murmur heard. Pulmonary/Chest: Effort normal and breath sounds normal. No respiratory distress.  Abdominal: Soft. There is no tenderness. There is no rebound and no guarding.  Musculoskeletal: Normal range of motion. She exhibits no edema and no tenderness.  Neurological: She is alert and oriented to person, place, and time. No cranial nerve deficit. She exhibits normal muscle tone. Coordination normal.  No ataxia on finger to nose bilaterally. No pronator drift. 5/5 strength throughout. CN 2-12 intact. Equal grip strength. Sensation intact.  Unable to test gait. No nystagmus.   Skin: Skin is warm.  Psychiatric: She has a normal mood and affect. Her behavior is normal.     ED Course  Procedures (including critical care time) DIAGNOSTIC STUDIES: Oxygen Saturation is 98% on RA, normal by my interpretation.    COORDINATION OF CARE: 4:31 PM- Pt advised of plan for treatment which includes medication, radiology, and labs and pt agrees.  Labs Review Labs Reviewed - No data to display  Imaging Review No results found.   EKG Interpretation   Date/Time:  Thursday May 23 2014 16:49:47 EDT Ventricular Rate:  71 PR Interval:  164 QRS Duration: 86 QT Interval:  416 QTC Calculation: 452 R Axis:   18 Text Interpretation:  Sinus rhythm Prominent P waves, nondiagnostic  Baseline wander in lead(s) V5 No significant change was found Confirmed by  Wyvonnia Dusky  MD, [place] [phone]) on 05/23/2014 4:58:03 PM    me:  Thursday May 23 2014 16:49:47 EDT Ventricular Rate:  71 PR Interval:  164 QRS Duration: 86 QT Interval:  416 QTC Calculation: 452 R Axis:   18 Text Interpretation:  Sinus rhythm Prominent P waves, nondiagnostic  Baseline wander in lead(s) V5 No significant change was found Confirmed by  Wyvonnia Dusky  MD, Ramere Downs ([id number]) on 05/23/2014 4:58:03 PM      MDM   Final diagnoses:  None  Vertigo and nausea and vomiting since last night.  Some difficulty walking.  Patient with some ataxia with nausea and vomiting. No appreciable nystagmus, no focal weakness, numbness tingling.  UA negative. Labs unremarkable. CT head negative.  Given age and risk factors for hypertension, will evaluate for posterior circulation infarct.  MRI negative for infarct. MRA negative for significant vascular stenosis. Symptoms improved in the ED with meclizine and valium.  Patient able to ambulate and tolerating PO.  Troponin negative x 2. EKG unchanged.  Supportive care for peripheral vertigo. Followup with PCP. Return precautions discussed.  Ezequiel Essex, MD 05/24/14 [phone]

## 2014-05-23 NOTE — ED Notes (Signed)
Pt c/o dizzy/n/v since this am. Pt states she took a stool softener and had a bm at 0600.

## 2014-05-23 NOTE — ED Notes (Signed)
Attempted orthostatic vital signs.  Patient stated "that room started spinning when I laid back".    Per nurse, wait until meds given for the dizziness, then we would attempt orthostatics later.

## 2014-05-23 NOTE — Discharge Instructions (Signed)
Dizziness Take the dizzy medications as prescribed. Follow up with your doctor. Return to the ED if you develop new or worsening symptoms. Dizziness is a common problem. It is a feeling of unsteadiness or light-headedness. You may feel like you are about to faint. Dizziness can lead to injury if you stumble or fall. A person of any age group can suffer from dizziness, but dizziness is more common in older adults. CAUSES  Dizziness can be caused by many different things, including:  Middle ear problems.  Standing for too long.  Infections.  An allergic reaction.  Aging.  An emotional response to something, such as the sight of blood.  Side effects of medicines.  Tiredness.  Problems with circulation or blood pressure.  Excessive use of alcohol or medicines, or illegal drug use.  Breathing too fast (hyperventilation).  An irregular heart rhythm (arrhythmia).  A low red blood cell count (anemia).  Pregnancy.  Vomiting, diarrhea, fever, or other illnesses that cause body fluid loss (dehydration).  Diseases or conditions such as Parkinson's disease, high blood pressure (hypertension), diabetes, and thyroid problems.  Exposure to extreme heat. DIAGNOSIS  Your health care provider will ask about your symptoms, perform a physical exam, and perform an electrocardiogram (ECG) to record the electrical activity of your heart. Your health care provider may also perform other heart or blood tests to determine the cause of your dizziness. These may include:  Transthoracic echocardiogram (TTE). During echocardiography, sound waves are used to evaluate how blood flows through your heart.  Transesophageal echocardiogram (TEE).  Cardiac monitoring. This allows your health care provider to monitor your heart rate and rhythm in real time.  Holter monitor. This is a portable device that records your heartbeat and can help diagnose heart arrhythmias. It allows your health care provider to  track your heart activity for several days if needed.  Stress tests by exercise or by giving medicine that makes the heart beat faster. TREATMENT  Treatment of dizziness depends on the cause of your symptoms and can vary greatly. HOME CARE INSTRUCTIONS   Drink enough fluids to keep your urine clear or pale yellow. This is especially important in very hot weather. In older adults, it is also important in cold weather.  Take your medicine exactly as directed if your dizziness is caused by medicines. When taking blood pressure medicines, it is especially important to get up slowly.  Rise slowly from chairs and steady yourself until you feel okay.  In the morning, first sit up on the side of the bed. When you feel okay, stand slowly while holding onto something until you know your balance is fine.  Move your legs often if you need to stand in one place for a long time. Tighten and relax your muscles in your legs while standing.  Have someone stay with you for 1-2 days if dizziness continues to be a problem. Do this until you feel you are well enough to stay alone. Have the person call your health care provider if he or she notices changes in you that are concerning.  Do not drive or use heavy machinery if you feel dizzy.  Do not drink alcohol. SEEK IMMEDIATE MEDICAL CARE IF:   Your dizziness or light-headedness gets worse.  You feel nauseous or vomit.  You have problems talking, walking, or using your arms, hands, or legs.  You feel weak.  You are not thinking clearly or you have trouble forming sentences. It may take a friend or family member  to notice this.  You have chest pain, abdominal pain, shortness of breath, or sweating.  Your vision changes.  You notice any bleeding.  You have side effects from medicine that seems to be getting worse rather than better. MAKE SURE YOU:   Understand these instructions.  Will watch your condition.  Will get help right away if you are  not doing well or get worse. Document Released: 03/30/2001 Document Revised: 10/09/2013 Document Reviewed: 04/23/2011 [facility] Patient Information 2015 [place], [place]. This information is not intended to replace advice given to you by your health care provider. Make sure you discuss any questions you have with your health care provider.

## 2014-05-23 NOTE — ED Notes (Signed)
Pt ambulated around room and is feeling better. Pt is eating potato soup at this time.

## 2014-05-23 NOTE — ED Notes (Signed)
Family at bedside. Explained to patient that EDP wanted her to ambulate. Patient states that when she turned over on her side she became very dizzy. Patient resting on stretcher in room with rails up.

## 2014-05-24 ENCOUNTER — Emergency Department (HOSPITAL_COMMUNITY)
Admission: EM | Admit: 2014-05-24 | Discharge: 2014-05-25 | Disposition: A | Discharge status: Home / Self Care | Payer: Medicare Other | Attending: Emergency Medicine | Admitting: Emergency Medicine

## 2014-05-24 ENCOUNTER — Encounter (HOSPITAL_COMMUNITY): Payer: Self-pay | Admitting: Emergency Medicine

## 2014-05-24 DIAGNOSIS — I10 Essential (primary) hypertension: Secondary | ICD-10-CM | POA: Diagnosis not present

## 2014-05-24 DIAGNOSIS — R110 Nausea: Secondary | ICD-10-CM | POA: Insufficient documentation

## 2014-05-24 DIAGNOSIS — Z884 Allergy status to anesthetic agent status: Secondary | ICD-10-CM | POA: Diagnosis not present

## 2014-05-24 DIAGNOSIS — Z79899 Other long term (current) drug therapy: Secondary | ICD-10-CM | POA: Insufficient documentation

## 2014-05-24 DIAGNOSIS — I69998 Other sequelae following unspecified cerebrovascular disease: Secondary | ICD-10-CM | POA: Diagnosis not present

## 2014-05-24 DIAGNOSIS — R42 Dizziness and giddiness: Secondary | ICD-10-CM | POA: Diagnosis present

## 2014-05-24 MED ORDER — MECLIZINE HCL 12.5 MG PO TABS
25.0000 mg | ORAL_TABLET | Freq: Once | ORAL | Status: AC
  Administered 2014-05-25: 25 mg via ORAL
  Filled 2014-05-24: qty 2

## 2014-05-24 MED ORDER — SODIUM CHLORIDE 0.9 % IV BOLUS (SEPSIS)
1000.0000 mL | Freq: Once | INTRAVENOUS | Status: AC
  Administered 2014-05-25: 1000 mL via INTRAVENOUS

## 2014-05-24 MED ORDER — SODIUM CHLORIDE 0.9 % IV SOLN: INTRAVENOUS | Status: DC

## 2014-05-24 MED ORDER — ONDANSETRON HCL 4 MG/2ML IJ SOLN
4.0000 mg | Freq: Once | INTRAMUSCULAR | Status: AC
  Administered 2014-05-25: 4 mg via INTRAVENOUS
  Filled 2014-05-24: qty 2

## 2014-05-24 MED ORDER — DIAZEPAM 5 MG/ML IJ SOLN
2.5000 mg | Freq: Once | INTRAMUSCULAR | Status: AC
  Administered 2014-05-25: 2.5 mg via INTRAVENOUS
  Filled 2014-05-24: qty 2

## 2014-05-24 NOTE — ED Notes (Signed)
Patient was seen yesterday and diagnosed with vertigo and given Antivert and Zofran prescriptions.  Patient c/o continued dizziness.

## 2014-05-24 NOTE — ED Provider Notes (Signed)
CSN: [id number]     Arrival date & time 05/24/14  2302 History  This chart was scribed for Janice Norrie, MD by Steva Colder, ED Scribe. The patient was seen in room [facility] at 11:37 PM.      Chief Complaint  Patient presents with  . Dizziness      The history is provided by the patient and a relative. No language interpreter was used.   Faith Lee is a 80 y.o. female with a medical hx of HTN and IBS who presents to the ED complaining of moderate intermittent dizziness onset yesterday morning.  She states that she woke up yesterday morning and the room was spinning and it made her sick. She states that she was seen at [facility] yesterday and dx with vertigo. She states that she was given Antivert and Zofran Rx. She states that she is still dizzy. She states that she was walking and feeling better once she left the hospital. She states that she went home after and went to bed. She states that this morning she was also dizzy. She states that she was not able to lift anything heavy. She states that after eating lunch and taking a pill for her dizziness. She informed her daughter that it felt like the pill was making her dizzier. She states that when she got up from a nap about 8 pm, she was dizzy and was unable to stand up. She states that she began to fall back on the bed. She states that she did taker her medication tonight around 9 PM. She states that she is currently nauseated. She states that she is only having the dizziness if she moves her head a certain way. She states that when she feels dizzy it is like someone is pulling her backwards. She states that she urinated normally today. She states that she is having nl bowel movements. She states that she is having associated symptoms of nausea, weakness. She denies abdominal pain, diarrhea, HA, and any other associated symptoms. She denies alcohol or cigarette use. She states that she has not been using her walker until the dizziness started  yesterday. She states that she lives alone.   PCPAsencion Noble, MD   Past Medical History  Diagnosis Date  . Hypertension    Past Surgical History  Procedure Laterality Date  . Abdominal hysterectomy     No family history on file. History  Substance Use Topics  . Smoking status: Never Smoker   . Smokeless tobacco: Not on file  . Alcohol Use: No   Pt lives at home alone  OB History   Grav Para Term Preterm Abortions TAB SAB Ect Mult Living                 Review of Systems  Gastrointestinal: Positive for nausea. Negative for abdominal pain and diarrhea.  Neurological: Positive for dizziness. Negative for headaches.  All other systems reviewed and are negative.     Allergies  Lidocaine  Home Medications   Prior to Admission medications   Medication Sig Start Date End Date Taking? Authorizing Provider  LORazepam (ATIVAN) 1 MG tablet Take 0.5 mg by mouth 2 (two) times daily.    Yes Historical Provider, MD  losartan-hydrochlorothiazide (HYZAAR) 100-25 MG per tablet Take 1 tablet by mouth daily. 04/10/14  Yes Historical Provider, MD  meclizine (ANTIVERT) 12.5 MG tablet Take 1 tablet (12.5 mg total) by mouth 3 (three) times daily as needed for dizziness. 05/23/14  Yes Annie Main  Rancour, MD  metoprolol succinate (TOPROL-XL) 50 MG 24 hr tablet Take 50 mg by mouth daily. Take with or immediately following a meal.   Yes Historical Provider, MD  ondansetron (ZOFRAN) 4 MG tablet Take 1 tablet (4 mg total) by mouth every 6 (six) hours. 05/23/14  Yes Ezequiel Essex, MD  potassium chloride (K-DUR) 10 MEQ tablet Take 10 mEq by mouth daily.    Yes Historical Provider, MD  PROCTOSOL HC 2.5 % rectal cream Place 1 application rectally daily as needed. hemrroids 05/13/14  Yes Historical Provider, MD  diazepam (VALIUM) 2 MG tablet Take 1 tablet (2 mg total) by mouth every 6 (six) hours as needed (dizziness). 05/25/14   Janice Norrie, MD  diphenhydrAMINE (BENADRYL) 12.5 MG/5ML elixir Take 5 mLs (12.5 mg  total) by mouth 4 (four) times daily as needed (dizziness). 05/25/14   Janice Norrie, MD  meclizine (ANTIVERT) 25 MG tablet Take 1 tablet (25 mg total) by mouth 4 (four) times daily as needed for dizziness or nausea. 05/25/14   Janice Norrie, MD   BP 159/75  Pulse 64  Temp(Src) 98.2 F (36.8 C) (Oral)  Resp 18  Ht 5\' 4"  (1.626 m)  Wt 210 lb (95.255 kg)  BMI 36.03 kg/m2  SpO2 99%  Vital signs normal    Physical Exam  Nursing note and vitals reviewed. Constitutional: She is oriented to person, place, and time. She appears well-developed and well-nourished.  Non-toxic appearance. She does not appear ill. No distress.  HENT:  Head: Normocephalic and atraumatic.  Right Ear: External ear normal.  Left Ear: External ear normal.  Nose: Nose normal. No mucosal edema or rhinorrhea.  Mouth/Throat: Mucous membranes are normal. No dental abscesses or uvula swelling.  Tongue is dry  Eyes: Conjunctivae and EOM are normal. Pupils are equal, round, and reactive to light.  Neck: Normal range of motion and full passive range of motion without pain. Neck supple.  Cardiovascular: Normal rate, regular rhythm and normal heart sounds.  Exam reveals no gallop and no friction rub.   No murmur heard. Pulmonary/Chest: Effort normal and breath sounds normal. No respiratory distress. She has no wheezes. She has no rhonchi. She has no rales. She exhibits no tenderness and no crepitus.  Abdominal: Soft. Normal appearance and bowel sounds are normal. She exhibits no distension. There is no tenderness. There is no rebound and no guarding.  Musculoskeletal: Normal range of motion. She exhibits no edema and no tenderness.  Moves all extremities well.   Neurological: She is alert and oriented to person, place, and time. She has normal strength. No cranial nerve deficit.  Skin: Skin is warm, dry and intact. No rash noted. No erythema. No pallor.  Psychiatric: She has a normal mood and affect. Her speech is normal and behavior  is normal. Her mood appears not anxious.    ED Course  Procedures (including critical care time)  Medications  0.9 %  sodium chloride infusion (not administered)  sodium chloride 0.9 % bolus 1,000 mL (1,000 mLs Intravenous New Bag/Given 05/25/14 0013)  meclizine (ANTIVERT) tablet 25 mg (25 mg Oral Given 05/25/14 0018)  ondansetron (ZOFRAN) injection 4 mg (4 mg Intravenous Given 05/25/14 0013)  diazepam (VALIUM) injection 2.5 mg (2.5 mg Intravenous Given 05/25/14 0016)  diazepam (VALIUM) injection 2.5 mg (2.5 mg Intravenous Given 05/25/14 0130)  diphenhydrAMINE (BENADRYL) injection 12.5 mg (12.5 mg Intravenous Given 05/25/14 0312)     DIAGNOSTIC STUDIES: Oxygen Saturation is 99% on room air, normal by  my interpretation.    COORDINATION OF CARE: 11:47 PM-Discussed treatment plan which includes Zofran. Antivert, Valium, IV fluids, and labs with pt at bedside and pt agreed to plan. Patient had very thorough evaluation done yesterday including CT of the head, MR of the brain, lab work and EKG.  01:00 nurse reports when patient got up to use bedside commode she acted like she was going to fall backwards, more valium was ordered.  02:30 Pt states she would like to try to go home. Her daughter is here and can stay with her for the next 4 days. She still has mild dizziness, benadryl added.   04:00 pt is able to get up, no dizziness, just feels sleepy. Wants to try to go home. Pt meds will be adjusted.    Results for orders placed during the hospital encounter of 05/24/14  I-STAT CHEM 8, ED      Result Value Ref Range   Sodium 137  137 - 147 mEq/L   Potassium 3.8  3.7 - 5.3 mEq/L   Chloride 101  96 - 112 mEq/L   BUN 13  6 - 23 mg/dL   Creatinine, Ser 0.90  0.50 - 1.10 mg/dL   Glucose, Bld 103 (*) 70 - 99 mg/dL   Calcium, Ion 1.11 (*) 1.13 - 1.30 mmol/L   TCO2 28  0 - 100 mmol/L   Hemoglobin 12.6  12.0 - 15.0 g/dL   HCT 37.0  36.0 - 46.0 %    Imaging Review Ct Head Wo Contrast  05/23/2014    CLINICAL DATA:  Dizziness, vomiting  EXAM: CT HEAD WITHOUT CONTRAST  TECHNIQUE: IMPRESSION: No evidence of acute intracranial abnormality.   Electronically Signed   By: Julian Hy M.D.   On: 05/23/2014 17:41   Mr Angiogram Head Wo Contrast  05/23/2014   CLINICAL DATA:  Dizziness with difficulty ambulating.  EXAM: MR HEAD WITHOUT CONTRAST  MR CIRCLE OF WILLIS WITHOUT CONTRAST  MRA OF THE NECK WITHOUT AND WITH CONTRAST  TECHNIQUE: IMPRESSION: No acute intracranial findings. Mild to moderate small vessel disease.  No flow-limiting stenosis of the major branches of the extracranial or intracranial circulation.   Electronically Signed   By: Rolla Flatten M.D.   On: 05/23/2014 21:05   Mr Angiogram Neck W Wo Contrast  05/23/2014   CLINICAL DATA:  Dizziness with difficulty ambulating.  EXAM: MR HEAD WITHOUT CONTRAST  MR CIRCLE OF WILLIS WITHOUT CONTRAST  MRA OF THE NECK WITHOUT AND WITH CONTRAST  TECHNIQUE:  IMPRESSION: No acute intracranial findings. Mild to moderate small vessel disease.  No flow-limiting stenosis of the major branches of the extracranial or intracranial circulation.   Electronically Signed   By: Rolla Flatten M.D.   On: 05/23/2014 21:05   Mr Brain Wo Contrast  05/23/2014   CLINICAL DATA:  Dizziness with difficulty ambulating.  EXAM: MR HEAD WITHOUT CONTRAST  MR CIRCLE OF WILLIS WITHOUT CONTRAST  MRA OF THE NECK WITHOUT AND WITH CONTRAST  TECHNIQUE:  IMPRESSION: No acute intracranial findings. Mild to moderate small vessel disease.  No flow-limiting stenosis of the major branches of the extracranial or intracranial circulation.   Electronically Signed   By: Rolla Flatten M.D.   On: 05/23/2014 21:05     EKG Interpretation None      MDM   Final diagnoses:  Vertigo    New Prescriptions   DIAZEPAM (VALIUM) 2 MG TABLET    Take 1 tablet (2 mg total) by mouth every 6 (six) hours  as needed (dizziness).   DIPHENHYDRAMINE (BENADRYL) 12.5 MG/5ML ELIXIR    Take 5 mLs (12.5 mg total) by mouth  4 (four) times daily as needed (dizziness).   MECLIZINE (ANTIVERT) 25 MG TABLET    Take 1 tablet (25 mg total) by mouth 4 (four) times daily as needed for dizziness or nausea.    Plan discharge  Rolland Porter, MD, FACEP   I personally performed the services described in this documentation, which was scribed in my presence. The recorded information has been reviewed and is accurate.    Janice Norrie, MD 05/25/14 [phone]

## 2014-05-25 DIAGNOSIS — I69998 Other sequelae following unspecified cerebrovascular disease: Secondary | ICD-10-CM | POA: Diagnosis not present

## 2014-05-25 LAB — I-STAT CHEM 8, ED
BUN: 13 mg/dL (ref 6–23)
CALCIUM ION: 1.11 mmol/L — AB (ref 1.13–1.30)
Chloride: 101 mEq/L (ref 96–112)
Creatinine, Ser: 0.9 mg/dL (ref 0.50–1.10)
Glucose, Bld: 103 mg/dL — ABNORMAL HIGH (ref 70–99)
HEMATOCRIT: 37 % (ref 36.0–46.0)
Hemoglobin: 12.6 g/dL (ref 12.0–15.0)
Potassium: 3.8 mEq/L (ref 3.7–5.3)
Sodium: 137 mEq/L (ref 137–147)
TCO2: 28 mmol/L (ref 0–100)

## 2014-05-25 MED ORDER — DIAZEPAM 2 MG PO TABS: 2.0000 mg | ORAL_TABLET | Freq: Four times a day (QID) | ORAL | Status: DC | PRN

## 2014-05-25 MED ORDER — MECLIZINE HCL 25 MG PO TABS: 25.0000 mg | ORAL_TABLET | Freq: Four times a day (QID) | ORAL | Status: DC | PRN

## 2014-05-25 MED ORDER — DIPHENHYDRAMINE HCL 50 MG/ML IJ SOLN
12.5000 mg | Freq: Once | INTRAMUSCULAR | Status: AC
  Administered 2014-05-25: 12.5 mg via INTRAVENOUS
  Filled 2014-05-25: qty 1

## 2014-05-25 MED ORDER — DIPHENHYDRAMINE HCL 12.5 MG/5ML PO ELIX: 12.5000 mg | ORAL_SOLUTION | Freq: Four times a day (QID) | ORAL | Status: DC | PRN

## 2014-05-25 MED ORDER — DIAZEPAM 5 MG/ML IJ SOLN
2.5000 mg | Freq: Once | INTRAMUSCULAR | Status: AC
  Administered 2014-05-25: 2.5 mg via INTRAVENOUS
  Filled 2014-05-25: qty 2

## 2014-05-25 NOTE — Discharge Instructions (Signed)
Take a higher dose of the meclizine of 25 mg every 6 hrs with the valium and benadryl for the next 24 hrs then as needed for dizziness. Use the zofran for nausea. Return if you get worse again. BE CAREFUL GOING UP OR DOWN STAIRS AND HOLD ONTO THE HANDRAIL SO YOU DON'T FALL. Let your daughter help you over the weekend so you don't fall and hurt yourself!

## 2014-05-25 NOTE — ED Notes (Signed)
Patient ambulated in room, states that she is not dizzy, just sleepy.

## 2014-06-20 ENCOUNTER — Ambulatory Visit (HOSPITAL_COMMUNITY)
Admission: RE | Admit: 2014-06-20 | Discharge: 2014-06-20 | Disposition: A | Discharge status: Home / Self Care | Payer: Medicare Other | Source: Ambulatory Visit | Attending: Internal Medicine | Admitting: Internal Medicine

## 2014-06-20 DIAGNOSIS — Z1231 Encounter for screening mammogram for malignant neoplasm of breast: Secondary | ICD-10-CM | POA: Diagnosis not present

## 2014-06-20 DIAGNOSIS — Z139 Encounter for screening, unspecified: Secondary | ICD-10-CM

## 2015-08-01 ENCOUNTER — Emergency Department (HOSPITAL_COMMUNITY): Payer: Medicare Other

## 2015-08-01 ENCOUNTER — Emergency Department (HOSPITAL_COMMUNITY)
Admission: EM | Admit: 2015-08-01 | Discharge: 2015-08-01 | Disposition: A | Discharge status: Home / Self Care | Payer: Medicare Other | Attending: Emergency Medicine | Admitting: Emergency Medicine

## 2015-08-01 ENCOUNTER — Encounter (HOSPITAL_COMMUNITY): Payer: Self-pay | Admitting: Emergency Medicine

## 2015-08-01 DIAGNOSIS — M25512 Pain in left shoulder: Secondary | ICD-10-CM | POA: Diagnosis not present

## 2015-08-01 DIAGNOSIS — I10 Essential (primary) hypertension: Secondary | ICD-10-CM | POA: Insufficient documentation

## 2015-08-01 DIAGNOSIS — R531 Weakness: Secondary | ICD-10-CM | POA: Diagnosis not present

## 2015-08-01 DIAGNOSIS — Z79899 Other long term (current) drug therapy: Secondary | ICD-10-CM | POA: Insufficient documentation

## 2015-08-01 DIAGNOSIS — E669 Obesity, unspecified: Secondary | ICD-10-CM | POA: Insufficient documentation

## 2015-08-01 DIAGNOSIS — R110 Nausea: Secondary | ICD-10-CM | POA: Insufficient documentation

## 2015-08-01 DIAGNOSIS — M79602 Pain in left arm: Secondary | ICD-10-CM | POA: Diagnosis present

## 2015-08-01 DIAGNOSIS — R079 Chest pain, unspecified: Secondary | ICD-10-CM | POA: Insufficient documentation

## 2015-08-01 LAB — CBC WITH DIFFERENTIAL/PLATELET
Basophils Absolute: 0 10*3/uL (ref 0.0–0.1)
Basophils Relative: 1 %
EOS ABS: 0.1 10*3/uL (ref 0.0–0.7)
Eosinophils Relative: 2 %
HCT: 35 % — ABNORMAL LOW (ref 36.0–46.0)
HEMOGLOBIN: 11.5 g/dL — AB (ref 12.0–15.0)
LYMPHS ABS: 2.4 10*3/uL (ref 0.7–4.0)
LYMPHS PCT: 44 %
MCH: 29.6 pg (ref 26.0–34.0)
MCHC: 32.9 g/dL (ref 30.0–36.0)
MCV: 90 fL (ref 78.0–100.0)
MONOS PCT: 9 %
Monocytes Absolute: 0.5 10*3/uL (ref 0.1–1.0)
Neutro Abs: 2.4 10*3/uL (ref 1.7–7.7)
Neutrophils Relative %: 44 %
Platelets: 271 10*3/uL (ref 150–400)
RBC: 3.89 MIL/uL (ref 3.87–5.11)
RDW: 13.8 % (ref 11.5–15.5)
WBC: 5.4 10*3/uL (ref 4.0–10.5)

## 2015-08-01 LAB — URINALYSIS, ROUTINE W REFLEX MICROSCOPIC
Bilirubin Urine: NEGATIVE
Glucose, UA: NEGATIVE mg/dL
Hgb urine dipstick: NEGATIVE
KETONES UR: NEGATIVE mg/dL
Leukocytes, UA: NEGATIVE
NITRITE: NEGATIVE
PH: 6.5 (ref 5.0–8.0)
Protein, ur: NEGATIVE mg/dL
Specific Gravity, Urine: <1.005 — ABNORMAL LOW (ref 1.005–1.030)
Urobilinogen, UA: 0.2 mg/dL (ref 0.0–1.0)

## 2015-08-01 LAB — COMPREHENSIVE METABOLIC PANEL
ALK PHOS: 42 U/L (ref 38–126)
ALT: 13 U/L — ABNORMAL LOW (ref 14–54)
ANION GAP: 7 (ref 5–15)
AST: 20 U/L (ref 15–41)
Albumin: 3.7 g/dL (ref 3.5–5.0)
BUN: 10 mg/dL (ref 6–20)
CO2: 31 mmol/L (ref 22–32)
Calcium: 8.9 mg/dL (ref 8.9–10.3)
Chloride: 101 mmol/L (ref 101–111)
Creatinine, Ser: 0.81 mg/dL (ref 0.44–1.00)
GFR calc non Af Amer: >60 mL/min (ref >60)
Glucose, Bld: 120 mg/dL — ABNORMAL HIGH (ref 65–99)
POTASSIUM: 4.1 mmol/L (ref 3.5–5.1)
SODIUM: 139 mmol/L (ref 135–145)
TOTAL PROTEIN: 6.7 g/dL (ref 6.5–8.1)
Total Bilirubin: 0.7 mg/dL (ref 0.3–1.2)

## 2015-08-01 LAB — TROPONIN I: Troponin I: <0.03 ng/mL (ref <0.031)

## 2015-08-01 LAB — LIPASE, BLOOD: LIPASE: 30 U/L (ref 22–51)

## 2015-08-01 NOTE — ED Provider Notes (Signed)
CSN: [id number]     Arrival date & time 08/01/15  [id number] History  By signing my name below, I, Terressa Koyanagi, attest that this documentation has been prepared under the direction and in the presence of Nat Christen, MD. Electronically Signed: Terressa Koyanagi, ED Scribe. 08/01/2015. 9:57 AM.  Chief Complaint  Patient presents with  . Arm Injury    left  . Anxiety  . Weakness   HPI PCP: Asencion Noble, MD HPI Comments: Faith Lee is a 81 y.o. female, with PMHx noted below including HTN, who presents to the Emergency Department complaining of atraumatic, intermittent, worsening, 6/10, burning/stabbing radiating left arm pain radiating to the left breast, left side of neck, left side of jaw, left ear and left hip onset 2 weeks ago. Associated Sx include intermittent weakness, nausea. Pt reports she was seen for the same by her PCP, however, with no Dx. Pt denies SOB, diaphoresis, v/d, new problems walking (pt uses a cane to ambulate at baseline) or any other Sx at this time.   Past Medical History  Diagnosis Date  . Hypertension    Past Surgical History  Procedure Laterality Date  . Abdominal hysterectomy     History reviewed. No pertinent family history. Social History  Substance Use Topics  . Smoking status: Never Smoker   . Smokeless tobacco: None  . Alcohol Use: No   OB History    No data available     Review of Systems  Constitutional: Negative for diaphoresis.  Respiratory: Negative for shortness of breath.   Gastrointestinal: Positive for nausea. Negative for vomiting and diarrhea.  Musculoskeletal: Negative for gait problem (uses cane at baseline).       Left arm pain radiating to the left breast, left side of neck, left side of jaw, left ear and left hip  Neurological: Positive for weakness.    A complete 10 system review of systems was obtained and all systems are negative except as noted in the HPI and PMH.  Allergies  Lidocaine  Home Medications   Prior to  Admission medications   Medication Sig Start Date End Date Taking? Authorizing Provider  acetaminophen (TYLENOL) 500 MG tablet Take 1,000 mg by mouth every 6 (six) hours as needed for moderate pain.   Yes Historical Provider, MD  acidophilus (RISAQUAD) CAPS capsule Take 1 capsule by mouth daily.   Yes Historical Provider, MD  Artificial Tear Ointment (DRY EYES OP) Apply 1 drop to eye daily as needed (dry eyes).   Yes Historical Provider, MD  LORazepam (ATIVAN) 1 MG tablet Take 0.5 mg by mouth 2 (two) times daily.    Yes Historical Provider, MD  losartan-hydrochlorothiazide (HYZAAR) 100-25 MG per tablet Take 1 tablet by mouth daily. 04/10/14  Yes Historical Provider, MD  metoprolol succinate (TOPROL-XL) 50 MG 24 hr tablet Take 50 mg by mouth daily. Take with or immediately following a meal.   Yes Historical Provider, MD  Omega-3 Fatty Acids (FISH OIL PO) Take 1 capsule by mouth 2 (two) times daily.   Yes Historical Provider, MD  potassium chloride (K-DUR) 10 MEQ tablet Take 10 mEq by mouth daily.    Yes Historical Provider, MD  PROCTOSOL HC 2.5 % rectal cream Place 1 application rectally daily as needed. hemrroids 05/13/14  Yes Historical Provider, MD   Triage Vitals: BP 151/72 mmHg  Pulse 85  Resp 21  Ht 5\' 4"  (1.626 m)  Wt 210 lb (95.255 kg)  BMI 36.03 kg/m2  SpO2 99% Physical Exam  Constitutional:  She is oriented to person, place, and time. She appears well-developed and well-nourished.  Obese   HENT:  Head: Normocephalic and atraumatic.  Eyes: Conjunctivae and EOM are normal. Pupils are equal, round, and reactive to light.  Neck: Normal range of motion. Neck supple.  Cardiovascular: Normal rate and regular rhythm.   Pulmonary/Chest: Effort normal and breath sounds normal.  Abdominal: Soft. Bowel sounds are normal.  Musculoskeletal: Normal range of motion.  Neurological: She is alert and oriented to person, place, and time.  Skin: Skin is warm and dry.  Psychiatric: She has a normal  mood and affect. Her behavior is normal.  Nursing note and vitals reviewed.  ED Course  Procedures (including critical care time) DIAGNOSTIC STUDIES: Oxygen Saturation is 99% on ra, nl by my interpretation.    COORDINATION OF CARE: 9:48 AM: Discussed treatment plan which includes EKG results and labs with pt at bedside; patient verbalizes understanding and agrees with treatment plan.  Labs Review Labs Reviewed  CBC WITH DIFFERENTIAL/PLATELET - Abnormal; Notable for the following:    Hemoglobin 11.5 (*)    HCT 35.0 (*)    All other components within normal limits  COMPREHENSIVE METABOLIC PANEL - Abnormal; Notable for the following:    Glucose, Bld 120 (*)    ALT 13 (*)    All other components within normal limits  URINALYSIS, ROUTINE W REFLEX MICROSCOPIC (NOT AT [facility]) - Abnormal; Notable for the following:    Specific Gravity, Urine <1.005 (*)    All other components within normal limits  LIPASE, BLOOD  TROPONIN I    I have personally reviewed and evaluated these lab results as part of my medical decision-making.   EKG Interpretation   Date/Time:  Friday August 01 2015 09:35:43 EDT Ventricular Rate:  81 PR Interval:  176 QRS Duration: 83 QT Interval:  378 QTC Calculation: 439 R Axis:   25 Text Interpretation:  Sinus rhythm Consider left atrial enlargement Low  voltage, precordial leads Borderline T wave abnormalities Baseline wander  in lead(s) V4 Confirmed by Lacinda Axon  MD, Justiss Gerbino ([id number]) on 08/01/2015 10:01:14  AM Also confirmed by Lacinda Axon  MD, Uldine Fuster ([id number])  on 08/01/2015 12:27:16 PM      MDM   Final diagnoses:  Chest pain, unspecified chest pain type  Left shoulder pain    Patient is in no acute distress. Screening tests revealed no life-threatening situation. She has primary care follow-up.  I, Juanluis Guastella, personally performed the services described in this documentation. All medical record entries made by the scribe were at my direction and in my presence.  I have  reviewed the chart and discharge instructions and agree that the record reflects my personal performance and is accurate and complete. Junious Ragone.  08/01/2015. 1:52 PM.     Nat Christen, MD 08/01/15 1353

## 2015-08-01 NOTE — ED Notes (Signed)
Having pain to left arm for last 2 weeks on and off.  Pt says pain radiates to left breast, left ear and left hip.  C/o weakness.  Denies any SOB.  Rates left arm 6/10.  Did not take any pain medication this am.

## 2015-08-01 NOTE — Discharge Instructions (Signed)
Tests showed no life-threatening condition.  Follow-up your primary care doctor. °

## 2018-04-17 DIAGNOSIS — M5030 Other cervical disc degeneration, unspecified cervical region: Secondary | ICD-10-CM | POA: Diagnosis not present

## 2018-04-17 DIAGNOSIS — I10 Essential (primary) hypertension: Secondary | ICD-10-CM | POA: Diagnosis not present

## 2018-05-16 DIAGNOSIS — I739 Peripheral vascular disease, unspecified: Secondary | ICD-10-CM | POA: Diagnosis not present

## 2018-05-16 DIAGNOSIS — M79671 Pain in right foot: Secondary | ICD-10-CM | POA: Diagnosis not present

## 2018-05-16 DIAGNOSIS — L600 Ingrowing nail: Secondary | ICD-10-CM | POA: Diagnosis not present

## 2018-05-16 DIAGNOSIS — M79672 Pain in left foot: Secondary | ICD-10-CM | POA: Diagnosis not present

## 2018-08-21 DIAGNOSIS — I10 Essential (primary) hypertension: Secondary | ICD-10-CM | POA: Diagnosis not present

## 2018-08-21 DIAGNOSIS — Z79899 Other long term (current) drug therapy: Secondary | ICD-10-CM | POA: Diagnosis not present

## 2018-08-28 DIAGNOSIS — M542 Cervicalgia: Secondary | ICD-10-CM | POA: Diagnosis not present

## 2018-08-28 DIAGNOSIS — Z23 Encounter for immunization: Secondary | ICD-10-CM | POA: Diagnosis not present

## 2018-08-28 DIAGNOSIS — I10 Essential (primary) hypertension: Secondary | ICD-10-CM | POA: Diagnosis not present

## 2018-11-14 DIAGNOSIS — M542 Cervicalgia: Secondary | ICD-10-CM | POA: Diagnosis not present

## 2018-11-14 DIAGNOSIS — R079 Chest pain, unspecified: Secondary | ICD-10-CM | POA: Diagnosis not present

## 2018-11-14 DIAGNOSIS — I10 Essential (primary) hypertension: Secondary | ICD-10-CM | POA: Diagnosis not present

## 2018-11-14 DIAGNOSIS — R0789 Other chest pain: Secondary | ICD-10-CM | POA: Diagnosis not present

## 2018-12-25 DIAGNOSIS — I10 Essential (primary) hypertension: Secondary | ICD-10-CM | POA: Diagnosis not present

## 2019-04-18 DIAGNOSIS — R52 Pain, unspecified: Secondary | ICD-10-CM | POA: Diagnosis not present

## 2019-05-11 DIAGNOSIS — I10 Essential (primary) hypertension: Secondary | ICD-10-CM | POA: Diagnosis not present

## 2019-08-09 DIAGNOSIS — Z23 Encounter for immunization: Secondary | ICD-10-CM | POA: Diagnosis not present

## 2019-09-04 DIAGNOSIS — I10 Essential (primary) hypertension: Secondary | ICD-10-CM | POA: Diagnosis not present

## 2019-09-04 DIAGNOSIS — Z79899 Other long term (current) drug therapy: Secondary | ICD-10-CM | POA: Diagnosis not present

## 2019-09-11 DIAGNOSIS — I10 Essential (primary) hypertension: Secondary | ICD-10-CM | POA: Diagnosis not present

## 2020-01-09 DIAGNOSIS — K589 Irritable bowel syndrome without diarrhea: Secondary | ICD-10-CM | POA: Diagnosis not present

## 2020-01-09 DIAGNOSIS — I10 Essential (primary) hypertension: Secondary | ICD-10-CM | POA: Diagnosis not present

## 2020-02-23 DIAGNOSIS — M79603 Pain in arm, unspecified: Secondary | ICD-10-CM | POA: Diagnosis not present

## 2020-02-23 DIAGNOSIS — R609 Edema, unspecified: Secondary | ICD-10-CM | POA: Diagnosis not present

## 2020-02-23 DIAGNOSIS — Z743 Need for continuous supervision: Secondary | ICD-10-CM | POA: Diagnosis not present

## 2020-02-23 DIAGNOSIS — I10 Essential (primary) hypertension: Secondary | ICD-10-CM | POA: Diagnosis not present

## 2020-03-13 DIAGNOSIS — R609 Edema, unspecified: Secondary | ICD-10-CM | POA: Diagnosis not present

## 2020-03-13 DIAGNOSIS — I10 Essential (primary) hypertension: Secondary | ICD-10-CM | POA: Diagnosis not present

## 2020-04-18 ENCOUNTER — Ambulatory Visit (HOSPITAL_COMMUNITY)
Admission: RE | Admit: 2020-04-18 | Discharge: 2020-04-18 | Disposition: A | Discharge status: Home / Self Care | Payer: Medicare Other | Source: Ambulatory Visit | Attending: Internal Medicine | Admitting: Internal Medicine

## 2020-04-18 ENCOUNTER — Other Ambulatory Visit (HOSPITAL_COMMUNITY): Payer: Self-pay | Admitting: Internal Medicine

## 2020-04-18 ENCOUNTER — Other Ambulatory Visit: Payer: Self-pay

## 2020-04-18 DIAGNOSIS — M25562 Pain in left knee: Secondary | ICD-10-CM

## 2020-04-18 DIAGNOSIS — M25561 Pain in right knee: Secondary | ICD-10-CM | POA: Diagnosis not present

## 2020-04-18 DIAGNOSIS — R609 Edema, unspecified: Secondary | ICD-10-CM | POA: Diagnosis not present

## 2020-04-18 DIAGNOSIS — M25511 Pain in right shoulder: Secondary | ICD-10-CM

## 2020-04-18 DIAGNOSIS — I10 Essential (primary) hypertension: Secondary | ICD-10-CM | POA: Diagnosis not present

## 2020-04-18 DIAGNOSIS — M1712 Unilateral primary osteoarthritis, left knee: Secondary | ICD-10-CM | POA: Diagnosis not present

## 2020-04-18 DIAGNOSIS — M1711 Unilateral primary osteoarthritis, right knee: Secondary | ICD-10-CM | POA: Diagnosis not present

## 2020-05-05 DIAGNOSIS — H16203 Unspecified keratoconjunctivitis, bilateral: Secondary | ICD-10-CM | POA: Diagnosis not present

## 2020-06-16 DIAGNOSIS — R609 Edema, unspecified: Secondary | ICD-10-CM | POA: Diagnosis not present

## 2020-06-16 DIAGNOSIS — K5900 Constipation, unspecified: Secondary | ICD-10-CM | POA: Diagnosis not present

## 2020-06-16 DIAGNOSIS — R8291 Other chromoabnormalities of urine: Secondary | ICD-10-CM | POA: Diagnosis not present

## 2020-06-16 DIAGNOSIS — R300 Dysuria: Secondary | ICD-10-CM | POA: Diagnosis not present

## 2020-06-18 DIAGNOSIS — M79672 Pain in left foot: Secondary | ICD-10-CM | POA: Diagnosis not present

## 2020-06-18 DIAGNOSIS — L600 Ingrowing nail: Secondary | ICD-10-CM | POA: Diagnosis not present

## 2020-06-18 DIAGNOSIS — M79671 Pain in right foot: Secondary | ICD-10-CM | POA: Diagnosis not present

## 2020-06-18 DIAGNOSIS — I739 Peripheral vascular disease, unspecified: Secondary | ICD-10-CM | POA: Diagnosis not present

## 2020-08-18 DIAGNOSIS — M79674 Pain in right toe(s): Secondary | ICD-10-CM | POA: Diagnosis not present

## 2020-08-18 DIAGNOSIS — M2041 Other hammer toe(s) (acquired), right foot: Secondary | ICD-10-CM | POA: Diagnosis not present

## 2020-08-20 DIAGNOSIS — Z79899 Other long term (current) drug therapy: Secondary | ICD-10-CM | POA: Diagnosis not present

## 2020-08-20 DIAGNOSIS — M1990 Unspecified osteoarthritis, unspecified site: Secondary | ICD-10-CM | POA: Diagnosis not present

## 2020-08-20 DIAGNOSIS — I10 Essential (primary) hypertension: Secondary | ICD-10-CM | POA: Diagnosis not present

## 2020-08-20 DIAGNOSIS — Z23 Encounter for immunization: Secondary | ICD-10-CM | POA: Diagnosis not present

## 2020-08-20 DIAGNOSIS — R609 Edema, unspecified: Secondary | ICD-10-CM | POA: Diagnosis not present

## 2020-09-30 DIAGNOSIS — M79671 Pain in right foot: Secondary | ICD-10-CM | POA: Diagnosis not present

## 2020-09-30 DIAGNOSIS — M79672 Pain in left foot: Secondary | ICD-10-CM | POA: Diagnosis not present

## 2020-09-30 DIAGNOSIS — I739 Peripheral vascular disease, unspecified: Secondary | ICD-10-CM | POA: Diagnosis not present

## 2020-09-30 DIAGNOSIS — L600 Ingrowing nail: Secondary | ICD-10-CM | POA: Diagnosis not present

## 2020-10-31 DIAGNOSIS — H6123 Impacted cerumen, bilateral: Secondary | ICD-10-CM | POA: Diagnosis not present

## 2020-10-31 DIAGNOSIS — H903 Sensorineural hearing loss, bilateral: Secondary | ICD-10-CM | POA: Diagnosis not present

## 2020-11-18 DIAGNOSIS — N1832 Chronic kidney disease, stage 3b: Secondary | ICD-10-CM | POA: Diagnosis not present

## 2020-11-18 DIAGNOSIS — I10 Essential (primary) hypertension: Secondary | ICD-10-CM | POA: Diagnosis not present

## 2020-12-16 DIAGNOSIS — I739 Peripheral vascular disease, unspecified: Secondary | ICD-10-CM | POA: Diagnosis not present

## 2020-12-16 DIAGNOSIS — M79671 Pain in right foot: Secondary | ICD-10-CM | POA: Diagnosis not present

## 2020-12-16 DIAGNOSIS — L600 Ingrowing nail: Secondary | ICD-10-CM | POA: Diagnosis not present

## 2020-12-16 DIAGNOSIS — M79672 Pain in left foot: Secondary | ICD-10-CM | POA: Diagnosis not present

## 2021-01-25 DIAGNOSIS — Z743 Need for continuous supervision: Secondary | ICD-10-CM | POA: Diagnosis not present

## 2021-01-25 DIAGNOSIS — R6889 Other general symptoms and signs: Secondary | ICD-10-CM | POA: Diagnosis not present

## 2021-01-25 DIAGNOSIS — R450 Nervousness: Secondary | ICD-10-CM | POA: Diagnosis not present

## 2021-01-27 DIAGNOSIS — I10 Essential (primary) hypertension: Secondary | ICD-10-CM | POA: Diagnosis not present

## 2021-01-27 DIAGNOSIS — R944 Abnormal results of kidney function studies: Secondary | ICD-10-CM | POA: Diagnosis not present

## 2021-01-27 DIAGNOSIS — K589 Irritable bowel syndrome without diarrhea: Secondary | ICD-10-CM | POA: Diagnosis not present

## 2021-01-27 DIAGNOSIS — R609 Edema, unspecified: Secondary | ICD-10-CM | POA: Diagnosis not present

## 2021-01-27 DIAGNOSIS — Z79899 Other long term (current) drug therapy: Secondary | ICD-10-CM | POA: Diagnosis not present

## 2021-02-06 ENCOUNTER — Encounter (HOSPITAL_COMMUNITY): Payer: Self-pay | Admitting: *Deleted

## 2021-02-06 ENCOUNTER — Other Ambulatory Visit: Payer: Self-pay

## 2021-02-06 ENCOUNTER — Emergency Department (HOSPITAL_COMMUNITY): Payer: Medicare Other

## 2021-02-06 ENCOUNTER — Emergency Department (HOSPITAL_COMMUNITY)
Admission: EM | Admit: 2021-02-06 | Discharge: 2021-02-06 | Disposition: A | Discharge status: Home / Self Care | Payer: Medicare Other | Attending: Emergency Medicine | Admitting: Emergency Medicine

## 2021-02-06 DIAGNOSIS — D649 Anemia, unspecified: Secondary | ICD-10-CM | POA: Diagnosis not present

## 2021-02-06 DIAGNOSIS — J069 Acute upper respiratory infection, unspecified: Secondary | ICD-10-CM | POA: Insufficient documentation

## 2021-02-06 DIAGNOSIS — Z743 Need for continuous supervision: Secondary | ICD-10-CM | POA: Diagnosis not present

## 2021-02-06 DIAGNOSIS — R059 Cough, unspecified: Secondary | ICD-10-CM | POA: Diagnosis not present

## 2021-02-06 DIAGNOSIS — I10 Essential (primary) hypertension: Secondary | ICD-10-CM | POA: Insufficient documentation

## 2021-02-06 DIAGNOSIS — D72829 Elevated white blood cell count, unspecified: Secondary | ICD-10-CM | POA: Insufficient documentation

## 2021-02-06 DIAGNOSIS — R0602 Shortness of breath: Secondary | ICD-10-CM | POA: Diagnosis not present

## 2021-02-06 DIAGNOSIS — Z79899 Other long term (current) drug therapy: Secondary | ICD-10-CM | POA: Diagnosis not present

## 2021-02-06 DIAGNOSIS — R509 Fever, unspecified: Secondary | ICD-10-CM | POA: Diagnosis not present

## 2021-02-06 DIAGNOSIS — Z20822 Contact with and (suspected) exposure to covid-19: Secondary | ICD-10-CM | POA: Insufficient documentation

## 2021-02-06 LAB — RESP PANEL BY RT-PCR (FLU A&B, COVID) ARPGX2
Influenza A by PCR: NEGATIVE
Influenza B by PCR: NEGATIVE
SARS Coronavirus 2 by RT PCR: NEGATIVE

## 2021-02-06 LAB — BASIC METABOLIC PANEL
Anion gap: 11 (ref 5–15)
BUN: 16 mg/dL (ref 8–23)
CO2: 24 mmol/L (ref 22–32)
Calcium: 8.2 mg/dL — ABNORMAL LOW (ref 8.9–10.3)
Chloride: 94 mmol/L — ABNORMAL LOW (ref 98–111)
Creatinine, Ser: 0.98 mg/dL (ref 0.44–1.00)
GFR, Estimated: 56 mL/min — ABNORMAL LOW (ref >60)
Glucose, Bld: 117 mg/dL — ABNORMAL HIGH (ref 70–99)
Potassium: 4.2 mmol/L (ref 3.5–5.1)
Sodium: 129 mmol/L — ABNORMAL LOW (ref 135–145)

## 2021-02-06 LAB — CBC WITH DIFFERENTIAL/PLATELET
Basophils Absolute: 0 10*3/uL (ref 0.0–0.1)
Basophils Relative: 0 %
Eosinophils Absolute: 0 10*3/uL (ref 0.0–0.5)
Eosinophils Relative: 0 %
HCT: 26.4 % — ABNORMAL LOW (ref 36.0–46.0)
Hemoglobin: 8.7 g/dL — ABNORMAL LOW (ref 12.0–15.0)
Lymphocytes Relative: 84 %
Lymphs Abs: 13 10*3/uL — ABNORMAL HIGH (ref 0.7–4.0)
MCH: 28.5 pg (ref 26.0–34.0)
MCHC: 33 g/dL (ref 30.0–36.0)
MCV: 86.6 fL (ref 80.0–100.0)
Monocytes Absolute: 0.5 10*3/uL (ref 0.1–1.0)
Monocytes Relative: 3 %
Neutro Abs: 2 10*3/uL (ref 1.7–7.7)
Neutrophils Relative %: 13 %
Platelets: 133 10*3/uL — ABNORMAL LOW (ref 150–400)
RBC: 3.05 MIL/uL — ABNORMAL LOW (ref 3.87–5.11)
RDW: 14.2 % (ref 11.5–15.5)
WBC: 15.5 10*3/uL — ABNORMAL HIGH (ref 4.0–10.5)
nRBC: 0 % (ref 0.0–0.2)

## 2021-02-06 LAB — POC OCCULT BLOOD, ED: Fecal Occult Bld: NEGATIVE

## 2021-02-06 LAB — BRAIN NATRIURETIC PEPTIDE: B Natriuretic Peptide: 59 pg/mL (ref 0.0–100.0)

## 2021-02-06 LAB — PROCALCITONIN: Procalcitonin: <0.1 ng/mL

## 2021-02-06 LAB — LACTIC ACID, PLASMA: Lactic Acid, Venous: 1.1 mmol/L (ref 0.5–1.9)

## 2021-02-06 MED ORDER — ACETAMINOPHEN 325 MG PO TABS
650.0000 mg | ORAL_TABLET | Freq: Once | ORAL | Status: AC
  Administered 2021-02-06: 650 mg via ORAL
  Filled 2021-02-06: qty 2

## 2021-02-06 MED ORDER — PREDNISONE 20 MG PO TABS: 20.0000 mg | ORAL_TABLET | Freq: Two times a day (BID) | ORAL | 0 refills | Status: AC

## 2021-02-06 MED ORDER — ALBUTEROL SULFATE HFA 108 (90 BASE) MCG/ACT IN AERS: 1.0000 | INHALATION_SPRAY | Freq: Four times a day (QID) | RESPIRATORY_TRACT | 0 refills | Status: DC | PRN

## 2021-02-06 MED ORDER — ALBUTEROL SULFATE (2.5 MG/3ML) 0.083% IN NEBU
2.5000 mg | INHALATION_SOLUTION | Freq: Once | RESPIRATORY_TRACT | Status: AC
  Administered 2021-02-06: 2.5 mg via RESPIRATORY_TRACT
  Filled 2021-02-06: qty 3

## 2021-02-06 MED ORDER — IPRATROPIUM-ALBUTEROL 0.5-2.5 (3) MG/3ML IN SOLN
3.0000 mL | Freq: Once | RESPIRATORY_TRACT | Status: AC
  Administered 2021-02-06: 3 mL via RESPIRATORY_TRACT
  Filled 2021-02-06: qty 3

## 2021-02-06 MED ORDER — SODIUM CHLORIDE 0.9 % IV SOLN
1.0000 g | Freq: Once | INTRAVENOUS | Status: AC
  Administered 2021-02-06: 1 g via INTRAVENOUS
  Filled 2021-02-06: qty 10

## 2021-02-06 MED ORDER — AZITHROMYCIN 250 MG PO TABS: 250.0000 mg | ORAL_TABLET | Freq: Every day | ORAL | 0 refills | Status: DC

## 2021-02-06 MED ORDER — METHYLPREDNISOLONE SODIUM SUCC 40 MG IJ SOLR
40.0000 mg | Freq: Once | INTRAMUSCULAR | Status: AC
  Administered 2021-02-06: 40 mg via INTRAVENOUS
  Filled 2021-02-06: qty 1

## 2021-02-06 NOTE — ED Triage Notes (Signed)
Pt c/o wheezing, cough, fever, insomnia over the last week. Pt called her PCP and he told her to come to the ED for possible pneumonia.

## 2021-02-06 NOTE — ED Provider Notes (Signed)
[facility] EMERGENCY DEPARTMENT Provider Note   CSN: [id number] Arrival date & time: 02/06/21  1702     History Chief Complaint  Patient presents with  . Cough    Faith Lee is a 87 y.o. female.  Patient presents chief complaint of cough and wheeze.  She said symptoms been going on for about 3 days.  Low-grade temperatures noted today denies fevers prior to this.  Denies vomiting or diarrhea.  Denies headache or chest pain or abdominal pain.  She states that she spoke with her primary care doctor who sent her to the ER for evaluation for possible pneumonia.        Past Medical History:  Diagnosis Date  . Hypertension     There are no problems to display for this patient.   Past Surgical History:  Procedure Laterality Date  . ABDOMINAL HYSTERECTOMY       OB History   No obstetric history on file.     No family history on file.  Social History   Tobacco Use  . Smoking status: Never Smoker  . Smokeless tobacco: Never Used  Vaping Use  . Vaping Use: Never used  Substance Use Topics  . Alcohol use: No  . Drug use: No    Home Medications Prior to Admission medications   Medication Sig Start Date End Date Taking? Authorizing Provider  albuterol (VENTOLIN HFA) 108 (90 Base) MCG/ACT inhaler Inhale 1-2 puffs into the lungs every 6 (six) hours as needed for wheezing or shortness of breath. 02/06/21  Yes Connar Keating, Greggory Brandy, MD  azithromycin (ZITHROMAX) 250 MG tablet Take 1 tablet (250 mg total) by mouth daily. Take first 2 tablets together, then 1 every day until finished. 02/06/21  Yes Luna Fuse, MD  predniSONE (DELTASONE) 20 MG tablet Take 1 tablet (20 mg total) by mouth in the morning and at bedtime for 5 days. 02/06/21 02/11/21 Yes Luna Fuse, MD  acetaminophen (TYLENOL) 500 MG tablet Take 1,000 mg by mouth every 6 (six) hours as needed for moderate pain.    [provider]  acidophilus (RISAQUAD) CAPS capsule Take 1 capsule by mouth daily.     [provider]  Artificial Tear Ointment (DRY EYES OP) Apply 1 drop to eye daily as needed (dry eyes).    [provider]  LORazepam (ATIVAN) 1 MG tablet Take 0.5 mg by mouth 2 (two) times daily.     [provider]  losartan-hydrochlorothiazide (HYZAAR) 100-25 MG per tablet Take 1 tablet by mouth daily. 04/10/14   [provider]  metoprolol succinate (TOPROL-XL) 50 MG 24 hr tablet Take 50 mg by mouth daily. Take with or immediately following a meal.    [provider]  Omega-3 Fatty Acids (FISH OIL PO) Take 1 capsule by mouth 2 (two) times daily.    [provider]  potassium chloride (K-DUR) 10 MEQ tablet Take 10 mEq by mouth daily.     [provider]  PROCTOSOL HC 2.5 % rectal cream Place 1 application rectally daily as needed. hemrroids 05/13/14   [provider]    Allergies    Lidocaine  Review of Systems   Review of Systems  HENT: Negative for ear pain.   Eyes: Negative for pain.  Respiratory: Positive for cough and shortness of breath.   Cardiovascular: Negative for chest pain.  Gastrointestinal: Negative for abdominal pain.  Genitourinary: Negative for flank pain.  Musculoskeletal: Negative for back pain.  Skin: Negative for rash.  Neurological: Negative for headaches.    Physical Exam Updated Vital Signs BP 131/60   Pulse 86   Temp 99.3 F (37.4 C) (Oral)   Resp (!) 23   Ht 5\' 4"  (1.626 m)   Wt 75.3 kg   SpO2 97%   BMI 28.49 kg/m   Physical Exam Constitutional:      General: She is not in acute distress.    Appearance: Normal appearance.  HENT:     Head: Normocephalic.     Nose: Nose normal.  Eyes:     Extraocular Movements: Extraocular movements intact.  Cardiovascular:     Rate and Rhythm: Normal rate.  Pulmonary:     Effort: Respiratory distress present.     Breath sounds: Wheezing present.  Musculoskeletal:        General: Normal range of motion.     Cervical back: Normal  range of motion.  Neurological:     General: No focal deficit present.     Mental Status: She is alert. Mental status is at baseline.     ED Results / Procedures / Treatments   Labs (all labs ordered are listed, but only abnormal results are displayed) Labs Reviewed  CBC WITH DIFFERENTIAL/PLATELET - Abnormal; Notable for the following components:      Result Value   WBC 15.5 (*)    RBC 3.05 (*)    Hemoglobin 8.7 (*)    HCT 26.4 (*)    Platelets 133 (*)    Lymphs Abs 13.0 (*)    All other components within normal limits  BASIC METABOLIC PANEL - Abnormal; Notable for the following components:   Sodium 129 (*)    Chloride 94 (*)    Glucose, Bld 117 (*)    Calcium 8.2 (*)    GFR, Estimated 56 (*)    All other components within normal limits  CULTURE, BLOOD (ROUTINE X 2)  RESP PANEL BY RT-PCR (FLU A&B, COVID) ARPGX2  CULTURE, BLOOD (ROUTINE X 2)  LACTIC ACID, PLASMA  PROCALCITONIN  BRAIN NATRIURETIC PEPTIDE  POC OCCULT BLOOD, ED    EKG None  Radiology DG Chest Port 1 View  Result Date: 02/06/2021 CLINICAL DATA:  Cough fever and insomnia x1 week EXAM: PORTABLE CHEST 1 VIEW COMPARISON:  August 01, 2015 FINDINGS: The heart size and mediastinal contours are within normal limits. Atherosclerotic calcifications of the aortic arch. No focal consolidation. No significant pleural effusion or visible pneumothorax. Thoracic spondylosis. IMPRESSION: No acute cardiopulmonary findings. Electronically Signed   By: Dahlia Bailiff MD   On: 02/06/2021 18:26    Procedures Procedures   Medications Ordered in ED Medications  cefTRIAXone (ROCEPHIN) 1 g in sodium chloride 0.9 % 100 mL IVPB (1 g Intravenous New Bag/Given 02/06/21 2025)  ipratropium-albuterol (DUONEB) 0.5-2.5 (3) MG/3ML nebulizer solution 3 mL (3 mLs Nebulization Given 02/06/21 1810)  acetaminophen (TYLENOL) tablet 650 mg (650 mg Oral Given 02/06/21 1802)  methylPREDNISolone sodium succinate (SOLU-MEDROL) 40 mg/mL injection 40  mg (40 mg Intravenous Given 02/06/21 1935)  albuterol (PROVENTIL) (2.5 MG/3ML) 0.083% nebulizer solution 2.5 mg (2.5 mg Nebulization Given 02/06/21 2003)    ED Course  I have reviewed the triage vital signs and the nursing notes.  Pertinent labs & imaging results that were available during my care of the patient were reviewed by me and considered in my medical decision making (see chart for details).    MDM Rules/Calculators/A&P  Patient has moderate tachypnea with diffuse wheezes on exam.  Labs are sent white count of 15 hemoglobin 8.7 which appears new for her.  Rectal exam shows normal colored yellow-brown stool with guaiac negative exam.  Chest x-ray is unremarkable for radiology no evidence of pneumonia noted procalcitonin also undetectable.  White count was elevated 15, however lactic acid normal as well.  Blood pressure remained stable and O2 saturation 97% on room air.  Patient given breathing treatments with improvement of wheezes and respiratory symptoms.  Given her advanced age and elevated white count will give antibiotics.  Given IV Rocephin here 1 g, will be sent home with azithromycin albuterol inhaler and steroids.  Advised her to follow-up with her doctor regarding her anemia and leukocytosis.  Advised immediate return for worsening symptoms, difficulty breathing or any additional concerns.  Family at bedside and patient agreeable to plan, discharged home in stable condition.  Final Clinical Impression(s) / ED Diagnoses Final diagnoses:  Upper respiratory tract infection, unspecified type  Anemia, unspecified type    Rx / DC Orders ED Discharge Orders         Ordered    azithromycin (ZITHROMAX) 250 MG tablet  Daily        02/06/21 2028    albuterol (VENTOLIN HFA) 108 (90 Base) MCG/ACT inhaler  Every 6 hours PRN        02/06/21 2028    predniSONE (DELTASONE) 20 MG tablet  2 times daily        02/06/21 2028           Luna Fuse,  MD 02/06/21 2028

## 2021-02-06 NOTE — Discharge Instructions (Addendum)
Call your primary care doctor or specialist as discussed in the next 2-3 days.   Return immediately back to the ER if:  Your symptoms worsen within the next 12-24 hours. You develop new symptoms such as new fevers, persistent vomiting, new pain, shortness of breath, or new weakness or numbness, or if you have any other concerns.  

## 2021-02-10 LAB — CULTURE, BLOOD (ROUTINE X 2)

## 2021-02-11 LAB — CULTURE, BLOOD (ROUTINE X 2)
Culture: NO GROWTH
Culture: NO GROWTH
Special Requests: ADEQUATE

## 2021-02-13 DIAGNOSIS — Z743 Need for continuous supervision: Secondary | ICD-10-CM | POA: Diagnosis not present

## 2021-02-14 DIAGNOSIS — I10 Essential (primary) hypertension: Secondary | ICD-10-CM | POA: Diagnosis not present

## 2021-02-20 DIAGNOSIS — K589 Irritable bowel syndrome without diarrhea: Secondary | ICD-10-CM | POA: Diagnosis not present

## 2021-02-20 DIAGNOSIS — M170 Bilateral primary osteoarthritis of knee: Secondary | ICD-10-CM | POA: Diagnosis not present

## 2021-02-20 DIAGNOSIS — Z955 Presence of coronary angioplasty implant and graft: Secondary | ICD-10-CM | POA: Diagnosis not present

## 2021-02-20 DIAGNOSIS — M160 Bilateral primary osteoarthritis of hip: Secondary | ICD-10-CM | POA: Diagnosis not present

## 2021-02-20 DIAGNOSIS — I10 Essential (primary) hypertension: Secondary | ICD-10-CM | POA: Diagnosis not present

## 2021-02-20 DIAGNOSIS — D649 Anemia, unspecified: Secondary | ICD-10-CM | POA: Diagnosis not present

## 2021-02-25 DIAGNOSIS — Z955 Presence of coronary angioplasty implant and graft: Secondary | ICD-10-CM | POA: Diagnosis not present

## 2021-02-25 DIAGNOSIS — M170 Bilateral primary osteoarthritis of knee: Secondary | ICD-10-CM | POA: Diagnosis not present

## 2021-02-25 DIAGNOSIS — K589 Irritable bowel syndrome without diarrhea: Secondary | ICD-10-CM | POA: Diagnosis not present

## 2021-02-25 DIAGNOSIS — D649 Anemia, unspecified: Secondary | ICD-10-CM | POA: Diagnosis not present

## 2021-02-25 DIAGNOSIS — M160 Bilateral primary osteoarthritis of hip: Secondary | ICD-10-CM | POA: Diagnosis not present

## 2021-02-25 DIAGNOSIS — I10 Essential (primary) hypertension: Secondary | ICD-10-CM | POA: Diagnosis not present

## 2021-02-26 DIAGNOSIS — K589 Irritable bowel syndrome without diarrhea: Secondary | ICD-10-CM | POA: Diagnosis not present

## 2021-02-26 DIAGNOSIS — I10 Essential (primary) hypertension: Secondary | ICD-10-CM | POA: Diagnosis not present

## 2021-02-26 DIAGNOSIS — M170 Bilateral primary osteoarthritis of knee: Secondary | ICD-10-CM | POA: Diagnosis not present

## 2021-02-26 DIAGNOSIS — Z955 Presence of coronary angioplasty implant and graft: Secondary | ICD-10-CM | POA: Diagnosis not present

## 2021-02-26 DIAGNOSIS — M160 Bilateral primary osteoarthritis of hip: Secondary | ICD-10-CM | POA: Diagnosis not present

## 2021-02-26 DIAGNOSIS — D649 Anemia, unspecified: Secondary | ICD-10-CM | POA: Diagnosis not present

## 2021-02-26 DIAGNOSIS — G3184 Mild cognitive impairment, so stated: Secondary | ICD-10-CM | POA: Diagnosis not present

## 2021-02-27 DIAGNOSIS — Z955 Presence of coronary angioplasty implant and graft: Secondary | ICD-10-CM | POA: Diagnosis not present

## 2021-02-27 DIAGNOSIS — K589 Irritable bowel syndrome without diarrhea: Secondary | ICD-10-CM | POA: Diagnosis not present

## 2021-02-27 DIAGNOSIS — D649 Anemia, unspecified: Secondary | ICD-10-CM | POA: Diagnosis not present

## 2021-02-27 DIAGNOSIS — M160 Bilateral primary osteoarthritis of hip: Secondary | ICD-10-CM | POA: Diagnosis not present

## 2021-02-27 DIAGNOSIS — M170 Bilateral primary osteoarthritis of knee: Secondary | ICD-10-CM | POA: Diagnosis not present

## 2021-02-27 DIAGNOSIS — I10 Essential (primary) hypertension: Secondary | ICD-10-CM | POA: Diagnosis not present

## 2021-03-02 DIAGNOSIS — Z955 Presence of coronary angioplasty implant and graft: Secondary | ICD-10-CM | POA: Diagnosis not present

## 2021-03-02 DIAGNOSIS — G3184 Mild cognitive impairment, so stated: Secondary | ICD-10-CM | POA: Diagnosis not present

## 2021-03-02 DIAGNOSIS — I10 Essential (primary) hypertension: Secondary | ICD-10-CM | POA: Diagnosis not present

## 2021-03-02 DIAGNOSIS — K589 Irritable bowel syndrome without diarrhea: Secondary | ICD-10-CM | POA: Diagnosis not present

## 2021-03-02 DIAGNOSIS — M160 Bilateral primary osteoarthritis of hip: Secondary | ICD-10-CM | POA: Diagnosis not present

## 2021-03-02 DIAGNOSIS — M170 Bilateral primary osteoarthritis of knee: Secondary | ICD-10-CM | POA: Diagnosis not present

## 2021-03-02 DIAGNOSIS — D649 Anemia, unspecified: Secondary | ICD-10-CM | POA: Diagnosis not present

## 2021-03-03 DIAGNOSIS — K589 Irritable bowel syndrome without diarrhea: Secondary | ICD-10-CM | POA: Diagnosis not present

## 2021-03-03 DIAGNOSIS — I10 Essential (primary) hypertension: Secondary | ICD-10-CM | POA: Diagnosis not present

## 2021-03-03 DIAGNOSIS — D649 Anemia, unspecified: Secondary | ICD-10-CM | POA: Diagnosis not present

## 2021-03-03 DIAGNOSIS — Z955 Presence of coronary angioplasty implant and graft: Secondary | ICD-10-CM | POA: Diagnosis not present

## 2021-03-03 DIAGNOSIS — M170 Bilateral primary osteoarthritis of knee: Secondary | ICD-10-CM | POA: Diagnosis not present

## 2021-03-03 DIAGNOSIS — M160 Bilateral primary osteoarthritis of hip: Secondary | ICD-10-CM | POA: Diagnosis not present

## 2021-03-04 DIAGNOSIS — I10 Essential (primary) hypertension: Secondary | ICD-10-CM | POA: Diagnosis not present

## 2021-03-04 DIAGNOSIS — M170 Bilateral primary osteoarthritis of knee: Secondary | ICD-10-CM | POA: Diagnosis not present

## 2021-03-04 DIAGNOSIS — M160 Bilateral primary osteoarthritis of hip: Secondary | ICD-10-CM | POA: Diagnosis not present

## 2021-03-04 DIAGNOSIS — D649 Anemia, unspecified: Secondary | ICD-10-CM | POA: Diagnosis not present

## 2021-03-04 DIAGNOSIS — K589 Irritable bowel syndrome without diarrhea: Secondary | ICD-10-CM | POA: Diagnosis not present

## 2021-03-04 DIAGNOSIS — Z955 Presence of coronary angioplasty implant and graft: Secondary | ICD-10-CM | POA: Diagnosis not present

## 2021-03-05 DIAGNOSIS — Z955 Presence of coronary angioplasty implant and graft: Secondary | ICD-10-CM | POA: Diagnosis not present

## 2021-03-05 DIAGNOSIS — K589 Irritable bowel syndrome without diarrhea: Secondary | ICD-10-CM | POA: Diagnosis not present

## 2021-03-05 DIAGNOSIS — M170 Bilateral primary osteoarthritis of knee: Secondary | ICD-10-CM | POA: Diagnosis not present

## 2021-03-05 DIAGNOSIS — I10 Essential (primary) hypertension: Secondary | ICD-10-CM | POA: Diagnosis not present

## 2021-03-05 DIAGNOSIS — D649 Anemia, unspecified: Secondary | ICD-10-CM | POA: Diagnosis not present

## 2021-03-05 DIAGNOSIS — M160 Bilateral primary osteoarthritis of hip: Secondary | ICD-10-CM | POA: Diagnosis not present

## 2021-03-06 DIAGNOSIS — Z955 Presence of coronary angioplasty implant and graft: Secondary | ICD-10-CM | POA: Diagnosis not present

## 2021-03-06 DIAGNOSIS — I10 Essential (primary) hypertension: Secondary | ICD-10-CM | POA: Diagnosis not present

## 2021-03-06 DIAGNOSIS — M160 Bilateral primary osteoarthritis of hip: Secondary | ICD-10-CM | POA: Diagnosis not present

## 2021-03-06 DIAGNOSIS — D649 Anemia, unspecified: Secondary | ICD-10-CM | POA: Diagnosis not present

## 2021-03-06 DIAGNOSIS — K589 Irritable bowel syndrome without diarrhea: Secondary | ICD-10-CM | POA: Diagnosis not present

## 2021-03-06 DIAGNOSIS — M170 Bilateral primary osteoarthritis of knee: Secondary | ICD-10-CM | POA: Diagnosis not present

## 2021-03-09 DIAGNOSIS — D649 Anemia, unspecified: Secondary | ICD-10-CM | POA: Diagnosis not present

## 2021-03-09 DIAGNOSIS — M170 Bilateral primary osteoarthritis of knee: Secondary | ICD-10-CM | POA: Diagnosis not present

## 2021-03-09 DIAGNOSIS — K589 Irritable bowel syndrome without diarrhea: Secondary | ICD-10-CM | POA: Diagnosis not present

## 2021-03-09 DIAGNOSIS — M160 Bilateral primary osteoarthritis of hip: Secondary | ICD-10-CM | POA: Diagnosis not present

## 2021-03-09 DIAGNOSIS — Z955 Presence of coronary angioplasty implant and graft: Secondary | ICD-10-CM | POA: Diagnosis not present

## 2021-03-09 DIAGNOSIS — I10 Essential (primary) hypertension: Secondary | ICD-10-CM | POA: Diagnosis not present

## 2021-03-10 ENCOUNTER — Other Ambulatory Visit: Payer: Self-pay

## 2021-03-10 ENCOUNTER — Other Ambulatory Visit (HOSPITAL_COMMUNITY): Payer: Self-pay | Admitting: Internal Medicine

## 2021-03-10 ENCOUNTER — Ambulatory Visit (HOSPITAL_COMMUNITY)
Admission: RE | Admit: 2021-03-10 | Discharge: 2021-03-10 | Disposition: A | Discharge status: Home / Self Care | Payer: Medicare Other | Source: Ambulatory Visit | Attending: Internal Medicine | Admitting: Internal Medicine

## 2021-03-10 DIAGNOSIS — R079 Chest pain, unspecified: Secondary | ICD-10-CM | POA: Insufficient documentation

## 2021-03-10 DIAGNOSIS — R0781 Pleurodynia: Secondary | ICD-10-CM | POA: Insufficient documentation

## 2021-03-10 DIAGNOSIS — M419 Scoliosis, unspecified: Secondary | ICD-10-CM | POA: Diagnosis not present

## 2021-03-12 DIAGNOSIS — K589 Irritable bowel syndrome without diarrhea: Secondary | ICD-10-CM | POA: Diagnosis not present

## 2021-03-12 DIAGNOSIS — I10 Essential (primary) hypertension: Secondary | ICD-10-CM | POA: Diagnosis not present

## 2021-03-12 DIAGNOSIS — M160 Bilateral primary osteoarthritis of hip: Secondary | ICD-10-CM | POA: Diagnosis not present

## 2021-03-12 DIAGNOSIS — Z955 Presence of coronary angioplasty implant and graft: Secondary | ICD-10-CM | POA: Diagnosis not present

## 2021-03-12 DIAGNOSIS — D649 Anemia, unspecified: Secondary | ICD-10-CM | POA: Diagnosis not present

## 2021-03-12 DIAGNOSIS — M170 Bilateral primary osteoarthritis of knee: Secondary | ICD-10-CM | POA: Diagnosis not present

## 2021-03-13 DIAGNOSIS — M160 Bilateral primary osteoarthritis of hip: Secondary | ICD-10-CM | POA: Diagnosis not present

## 2021-03-13 DIAGNOSIS — D649 Anemia, unspecified: Secondary | ICD-10-CM | POA: Diagnosis not present

## 2021-03-13 DIAGNOSIS — Z955 Presence of coronary angioplasty implant and graft: Secondary | ICD-10-CM | POA: Diagnosis not present

## 2021-03-13 DIAGNOSIS — K589 Irritable bowel syndrome without diarrhea: Secondary | ICD-10-CM | POA: Diagnosis not present

## 2021-03-13 DIAGNOSIS — I10 Essential (primary) hypertension: Secondary | ICD-10-CM | POA: Diagnosis not present

## 2021-03-13 DIAGNOSIS — M170 Bilateral primary osteoarthritis of knee: Secondary | ICD-10-CM | POA: Diagnosis not present

## 2021-03-17 DIAGNOSIS — M160 Bilateral primary osteoarthritis of hip: Secondary | ICD-10-CM | POA: Diagnosis not present

## 2021-03-17 DIAGNOSIS — M170 Bilateral primary osteoarthritis of knee: Secondary | ICD-10-CM | POA: Diagnosis not present

## 2021-03-17 DIAGNOSIS — I10 Essential (primary) hypertension: Secondary | ICD-10-CM | POA: Diagnosis not present

## 2021-03-17 DIAGNOSIS — Z955 Presence of coronary angioplasty implant and graft: Secondary | ICD-10-CM | POA: Diagnosis not present

## 2021-03-17 DIAGNOSIS — K589 Irritable bowel syndrome without diarrhea: Secondary | ICD-10-CM | POA: Diagnosis not present

## 2021-03-17 DIAGNOSIS — D649 Anemia, unspecified: Secondary | ICD-10-CM | POA: Diagnosis not present

## 2021-03-19 DIAGNOSIS — M170 Bilateral primary osteoarthritis of knee: Secondary | ICD-10-CM | POA: Diagnosis not present

## 2021-03-19 DIAGNOSIS — Z955 Presence of coronary angioplasty implant and graft: Secondary | ICD-10-CM | POA: Diagnosis not present

## 2021-03-19 DIAGNOSIS — M160 Bilateral primary osteoarthritis of hip: Secondary | ICD-10-CM | POA: Diagnosis not present

## 2021-03-19 DIAGNOSIS — K589 Irritable bowel syndrome without diarrhea: Secondary | ICD-10-CM | POA: Diagnosis not present

## 2021-03-19 DIAGNOSIS — I10 Essential (primary) hypertension: Secondary | ICD-10-CM | POA: Diagnosis not present

## 2021-03-19 DIAGNOSIS — D649 Anemia, unspecified: Secondary | ICD-10-CM | POA: Diagnosis not present

## 2021-03-20 DIAGNOSIS — M160 Bilateral primary osteoarthritis of hip: Secondary | ICD-10-CM | POA: Diagnosis not present

## 2021-03-20 DIAGNOSIS — I10 Essential (primary) hypertension: Secondary | ICD-10-CM | POA: Diagnosis not present

## 2021-03-20 DIAGNOSIS — M170 Bilateral primary osteoarthritis of knee: Secondary | ICD-10-CM | POA: Diagnosis not present

## 2021-03-20 DIAGNOSIS — D649 Anemia, unspecified: Secondary | ICD-10-CM | POA: Diagnosis not present

## 2021-03-20 DIAGNOSIS — Z955 Presence of coronary angioplasty implant and graft: Secondary | ICD-10-CM | POA: Diagnosis not present

## 2021-03-20 DIAGNOSIS — K589 Irritable bowel syndrome without diarrhea: Secondary | ICD-10-CM | POA: Diagnosis not present

## 2021-03-24 DIAGNOSIS — Z955 Presence of coronary angioplasty implant and graft: Secondary | ICD-10-CM | POA: Diagnosis not present

## 2021-03-24 DIAGNOSIS — M170 Bilateral primary osteoarthritis of knee: Secondary | ICD-10-CM | POA: Diagnosis not present

## 2021-03-24 DIAGNOSIS — D649 Anemia, unspecified: Secondary | ICD-10-CM | POA: Diagnosis not present

## 2021-03-24 DIAGNOSIS — M160 Bilateral primary osteoarthritis of hip: Secondary | ICD-10-CM | POA: Diagnosis not present

## 2021-03-24 DIAGNOSIS — I10 Essential (primary) hypertension: Secondary | ICD-10-CM | POA: Diagnosis not present

## 2021-03-24 DIAGNOSIS — K589 Irritable bowel syndrome without diarrhea: Secondary | ICD-10-CM | POA: Diagnosis not present

## 2021-03-26 DIAGNOSIS — M160 Bilateral primary osteoarthritis of hip: Secondary | ICD-10-CM | POA: Diagnosis not present

## 2021-03-26 DIAGNOSIS — I10 Essential (primary) hypertension: Secondary | ICD-10-CM | POA: Diagnosis not present

## 2021-03-26 DIAGNOSIS — D649 Anemia, unspecified: Secondary | ICD-10-CM | POA: Diagnosis not present

## 2021-03-26 DIAGNOSIS — K589 Irritable bowel syndrome without diarrhea: Secondary | ICD-10-CM | POA: Diagnosis not present

## 2021-03-26 DIAGNOSIS — M170 Bilateral primary osteoarthritis of knee: Secondary | ICD-10-CM | POA: Diagnosis not present

## 2021-03-26 DIAGNOSIS — Z955 Presence of coronary angioplasty implant and graft: Secondary | ICD-10-CM | POA: Diagnosis not present

## 2021-03-27 DIAGNOSIS — M160 Bilateral primary osteoarthritis of hip: Secondary | ICD-10-CM | POA: Diagnosis not present

## 2021-03-27 DIAGNOSIS — M170 Bilateral primary osteoarthritis of knee: Secondary | ICD-10-CM | POA: Diagnosis not present

## 2021-03-27 DIAGNOSIS — D649 Anemia, unspecified: Secondary | ICD-10-CM | POA: Diagnosis not present

## 2021-03-27 DIAGNOSIS — K589 Irritable bowel syndrome without diarrhea: Secondary | ICD-10-CM | POA: Diagnosis not present

## 2021-03-27 DIAGNOSIS — Z955 Presence of coronary angioplasty implant and graft: Secondary | ICD-10-CM | POA: Diagnosis not present

## 2021-03-27 DIAGNOSIS — I10 Essential (primary) hypertension: Secondary | ICD-10-CM | POA: Diagnosis not present

## 2021-04-14 DIAGNOSIS — M79671 Pain in right foot: Secondary | ICD-10-CM | POA: Diagnosis not present

## 2021-04-14 DIAGNOSIS — L600 Ingrowing nail: Secondary | ICD-10-CM | POA: Diagnosis not present

## 2021-04-14 DIAGNOSIS — I739 Peripheral vascular disease, unspecified: Secondary | ICD-10-CM | POA: Diagnosis not present

## 2021-04-14 DIAGNOSIS — M79672 Pain in left foot: Secondary | ICD-10-CM | POA: Diagnosis not present

## 2021-04-16 DIAGNOSIS — I10 Essential (primary) hypertension: Secondary | ICD-10-CM | POA: Diagnosis not present

## 2021-04-28 DIAGNOSIS — R609 Edema, unspecified: Secondary | ICD-10-CM | POA: Diagnosis not present

## 2021-04-28 DIAGNOSIS — I10 Essential (primary) hypertension: Secondary | ICD-10-CM | POA: Diagnosis not present

## 2021-04-28 DIAGNOSIS — Z79899 Other long term (current) drug therapy: Secondary | ICD-10-CM | POA: Diagnosis not present

## 2021-04-28 DIAGNOSIS — D696 Thrombocytopenia, unspecified: Secondary | ICD-10-CM | POA: Diagnosis not present

## 2021-04-28 DIAGNOSIS — E871 Hypo-osmolality and hyponatremia: Secondary | ICD-10-CM | POA: Diagnosis not present

## 2021-04-28 DIAGNOSIS — D509 Iron deficiency anemia, unspecified: Secondary | ICD-10-CM | POA: Diagnosis not present

## 2021-04-28 DIAGNOSIS — D649 Anemia, unspecified: Secondary | ICD-10-CM | POA: Diagnosis not present

## 2021-05-10 NOTE — Progress Notes (Signed)
[facility] Telephone:([phone]   Fax:([phone]) [place] NOTE  Patient Care Team: Asencion Noble, MD as PCP - General (Internal Medicine)  Hematological/Oncological History 1) : Labs from PCP, Dr. Asencion Noble -09/03/2020: WBC 8.9, Hgb 11.3, MCV 83, Plt 244 -08/20/2020: WBC 12.6 (H), Hgb 10.4 (L), MCV 82, Plt 228 -7/12/022: WBC 12.9 (H), Hgb 8.8 (L), MCV 87, Plt 171, Lymphs Abs 27.4 (H), Mono Abs 2.3 (H)  2) 05/11/2021: Establish care at [facility]:  "Lymphocytosis and Normocytic Anemia"  HISTORY OF PRESENTING ILLNESS:  Faith Lee 87 y.o. female with medical history significant for anxiety, hypertension, IBS and osteoarthritis. Patient is accompanied by her daughter for this visit.   On exam today, Faith Lee reports that her energy levels are generally stable. She notices more fatigue when she has flare ups of IBS. She reports that she can complete her basic ADLS on her own including self care. Patient and her daughter reports an approximately 40 lb weight loss over the past year that was unintentional. She denies any nausea or vomiting. Patient reports occasional episodes of suprapubic pain that resolves on its own. She notes having hard stools that require straining. She has a bowel movement every other day. She is taking OTC laxatives and stool softeners. Patient does have hemorrhoids that cause rectal pain. Patient denies easy bruising or signs of bleeding. Patient reports numbness in the fingertips. She reports insomnia with trouble falling and staying asleep. She has occasional episodes of chest pressure that she attributes to anxiety. She denies any fevers, chills or night sweats. She has no other complaints. Rest of the 10 point ROS is below.   MEDICAL HISTORY:  Past Medical History:  Diagnosis Date   Anxiety    Hypertension    Irritable bowel disease    Osteoarthritis     SURGICAL  HISTORY: Past Surgical History:  Procedure Laterality Date   ABDOMINAL HYSTERECTOMY  1974   CARDIAC CATHETERIZATION     1991 and 2005    SOCIAL HISTORY: Social History   Socioeconomic History   Marital status: Widowed    Spouse name: Not on file   Number of children: Not on file   Years of education: Not on file   Highest education level: Not on file  Occupational History   Not on file  Tobacco Use   Smoking status: Never   Smokeless tobacco: Never  Vaping Use   Vaping Use: Never used  Substance and Sexual Activity   Alcohol use: No   Drug use: No   Sexual activity: Not on file  Other Topics Concern   Not on file  Social History Narrative   Not on file   Social Determinants of Health   Financial Resource Strain: Low Risk    Difficulty of Paying Living Expenses: Not very hard  Food Insecurity: No Food Insecurity   Worried About Running Out of Food in the Last Year: Never true   Ran Out of Food in the Last Year: Never true  Transportation Needs: No Transportation Needs   Lack of Transportation (Medical): No   Lack of Transportation (Non-Medical): No  Physical Activity: Insufficiently Active   Days of Exercise per Week: 2 days   Minutes of Exercise per Session: 10 min  Stress: Stress Concern Present   Feeling of Stress : To some extent  Social Connections: Moderately Integrated   Frequency of Communication with Friends and Family: More than three  times a week   Frequency of Social Gatherings with Friends and Family: Twice a week   Attends Religious Services: More than 4 times per year   Active Member of [employer] or Organizations: Yes   Attends Archivist Meetings: 1 to 4 times per year   Marital Status: Widowed  Human resources officer Violence: Not At Risk   Fear of Current or Ex-Partner: No   Emotionally Abused: No   Physically Abused: No   Sexually Abused: No    FAMILY HISTORY: Family History  Problem Relation Age of Onset   Renal Disease Sister     Multiple myeloma Sister     ALLERGIES:  is allergic to lidocaine.  MEDICATIONS:  Current Outpatient Medications  Medication Sig Dispense Refill   furosemide (LASIX) 20 MG tablet Take 20 mg by mouth daily.     LORazepam (ATIVAN) 0.5 MG tablet Take 0.5 mg by mouth 2 (two) times daily.     metoprolol tartrate (LOPRESSOR) 25 MG tablet Take 25 mg by mouth 2 (two) times daily.     MODERNA COVID-19 VACCINE 100 MCG/0.5ML injection      potassium chloride (MICRO-K) 10 MEQ CR capsule Take 10 mEq by mouth daily.     acetaminophen (TYLENOL) 500 MG tablet Take 1,000 mg by mouth every 6 (six) hours as needed for moderate pain. (Patient not taking: Reported on 05/11/2021)     No current facility-administered medications for this visit.    REVIEW OF SYSTEMS:   Constitutional: ( - ) fevers, ( - )  chills , ( - ) night sweats Eyes: ( - ) blurriness of vision, ( - ) double vision, ( - ) watery eyes Ears, nose, mouth, throat, and face: ( - ) mucositis, ( - ) sore throat Respiratory: ( - ) cough, ( - ) dyspnea, ( - ) wheezes Cardiovascular: ( - ) palpitation, ( - ) chest discomfort, ( - ) lower extremity swelling Gastrointestinal:  ( - ) nausea, ( - ) heartburn, ( + ) change in bowel habits Skin: ( - ) abnormal skin rashes Lymphatics: ( - ) new lymphadenopathy, ( - ) easy bruising Neurological: ( + ) numbness, ( - ) tingling, ( - ) new weaknesses Behavioral/Psych: ( - ) mood change, ( - ) new changes  All other systems were reviewed with the patient and are negative.  PHYSICAL EXAMINATION: ECOG PERFORMANCE STATUS: 1 - Symptomatic but completely ambulatory  Vitals:   05/11/21 1017  BP: (!) 134/54  Pulse: 97  Resp: 17  Temp: (!) 97 F (36.1 C)  SpO2: 100%   Filed Weights   05/11/21 1017  Weight: 162 lb (73.5 kg)    GENERAL: well appearing female in NAD  SKIN: skin color, texture, turgor are normal, no rashes or significant lesions EYES: conjunctiva are pink and non-injected, sclera  clear OROPHARYNX: no exudate, no erythema; lips, buccal mucosa, and tongue normal  LYMPH:  no palpable lymphadenopathy in the cervical, axillary or supraclavicular lymph nodes.  LUNGS: clear to auscultation and percussion with normal breathing effort HEART: regular rate & rhythm and no murmurs and no lower extremity edema ABDOMEN: soft, non-tender, non-distended, normal bowel sounds Musculoskeletal: no cyanosis of digits and no clubbing  PSYCH: alert & oriented x 3, fluent speech NEURO: no focal motor/sensory deficits  LABORATORY DATA:  I have reviewed the data as listed CBC Latest Ref Rng & Units 05/11/2021 02/06/2021 08/01/2015  WBC 4.0 - 10.5 K/uL 35.6(H) 15.5(H) 5.4  Hemoglobin 12.0 - 15.0  g/dL 9.2(L) 8.7(L) 11.5(L)  Hematocrit 36.0 - 46.0 % 28.7(L) 26.4(L) 35.0(L)  Platelets 150 - 400 K/uL 179 133(L) 271    CMP Latest Ref Rng & Units 05/11/2021 02/06/2021 08/01/2015  Glucose 70 - 99 mg/dL 90 117(H) 120(H)  BUN 8 - 23 mg/dL _0 Creatinine 0.44 - 1.00 mg/dL 1.11(H) 0.98 0.81  Sodium 135 - 145 mmol/L 132(L) 129(L) 139  Potassium 3.5 - 5.1 mmol/L 4.1 4.2 4.1  Chloride 98 - 111 mmol/L 97(L) 94(L) 101  CO2 22 - 32 mmol/L _1 Calcium 8.9 - 10.3 mg/dL 8.6(L) 8.2(L) 8.9  Total Protein 6.5 - 8.1 g/dL 6.4(L) - 6.7  Total Bilirubin 0.3 - 1.2 mg/dL 0.5 - 0.7  Alkaline Phos 38 - 126 U/L 47 - 42  AST 15 - 41 U/L 23 - 20  ALT 0 - 44 U/L 12 - 13(L)    ASSESSMENT & PLAN Faith Lee is a 87 y.o. female who presents to the clinic for evaluation for lymphocytosis and anemia. Labs from 04/28/2021 shows elevated WBC at 12.9 with absolute lymphocyte count of 27.4. Additionally, there is evidence of worsening anemia with hemoglobin of 8.8. There is evidence of B symptoms as patient is reporting progressive unintentional weight loss over the past year. She denies any fevers or night sweats. I recommend to proceed with workup to check CBC, CMP, LDH and flow cytometry. Additionally, we  will check iron, B12 and folate levels to further evaluate anemia.   #Lymphocytosis: --Labs on 04/28/2021 shows elevated WBC at 12.9 with absolute lymphocyte count of 27.4.  --B symptoms include 40 lb unintentional weight loss over the past year.Denies fevers or night sweats.  --Labs today to check CBC, CMP, LDH and flow cytometry. --Return to clinic in 2 weeks to see Dr. [place] Pink to review lab results.   #Normocytic Anemia: --Labs on 04/17/2021 show hemoglobin 8.8 and MCV 87.  --Labs today to check iron and TIBC, ferritin, vitamin B12 and folate levels.    Orders Placed This Encounter  Procedures   CBC with Differential    Standing Status:   Future    Number of Occurrences:   1    Standing Expiration Date:   05/10/2022   Comprehensive metabolic panel    Standing Status:   Future    Number of Occurrences:   1    Standing Expiration Date:   05/10/2022   Lactate dehydrogenase    Standing Status:   Future    Number of Occurrences:   1    Standing Expiration Date:   05/10/2022   Flow Cytometry, Peripheral Blood (Oncology)   Ferritin    Standing Status:   Future    Number of Occurrences:   1    Standing Expiration Date:   05/11/2022   Iron and TIBC    Standing Status:   Future    Number of Occurrences:   1    Standing Expiration Date:   05/11/2022   Vitamin B12    Standing Status:   Future    Number of Occurrences:   1    Standing Expiration Date:   05/11/2022   Folate    Standing Status:   Future    Number of Occurrences:   1    Standing Expiration Date:   05/11/2022    All questions were answered. The patient knows to call the clinic with any problems, questions or concerns.  I have spent a total of 60 minutes minutes  of face-to-face and non-face-to-face time, preparing to see the patient, obtaining and/or reviewing separately obtained history, performing a medically appropriate examination, counseling and educating the patient, ordering tests, documenting clinical information  in the electronic health record, and care coordination.   Dede Query, PA-C Department of Hematology/Oncology [place] [facility] Phone: [phone]

## 2021-05-11 ENCOUNTER — Inpatient Hospital Stay (HOSPITAL_COMMUNITY): Payer: Medicare Other

## 2021-05-11 ENCOUNTER — Encounter (HOSPITAL_COMMUNITY): Payer: Self-pay | Admitting: Physician Assistant

## 2021-05-11 ENCOUNTER — Inpatient Hospital Stay (HOSPITAL_COMMUNITY): Payer: Medicare Other | Attending: Physician Assistant | Admitting: Physician Assistant

## 2021-05-11 ENCOUNTER — Other Ambulatory Visit: Payer: Self-pay

## 2021-05-11 VITALS — BP 134/54 | HR 97 | Temp 97.0°F | Resp 17 | Ht 64.0 in | Wt 162.0 lb

## 2021-05-11 DIAGNOSIS — F419 Anxiety disorder, unspecified: Secondary | ICD-10-CM | POA: Diagnosis not present

## 2021-05-11 DIAGNOSIS — Z808 Family history of malignant neoplasm of other organs or systems: Secondary | ICD-10-CM | POA: Diagnosis not present

## 2021-05-11 DIAGNOSIS — R200 Anesthesia of skin: Secondary | ICD-10-CM | POA: Insufficient documentation

## 2021-05-11 DIAGNOSIS — R0789 Other chest pain: Secondary | ICD-10-CM | POA: Diagnosis not present

## 2021-05-11 DIAGNOSIS — K589 Irritable bowel syndrome without diarrhea: Secondary | ICD-10-CM | POA: Diagnosis not present

## 2021-05-11 DIAGNOSIS — R194 Change in bowel habit: Secondary | ICD-10-CM | POA: Insufficient documentation

## 2021-05-11 DIAGNOSIS — D72820 Lymphocytosis (symptomatic): Secondary | ICD-10-CM | POA: Insufficient documentation

## 2021-05-11 DIAGNOSIS — I10 Essential (primary) hypertension: Secondary | ICD-10-CM | POA: Insufficient documentation

## 2021-05-11 DIAGNOSIS — Z841 Family history of disorders of kidney and ureter: Secondary | ICD-10-CM | POA: Diagnosis not present

## 2021-05-11 DIAGNOSIS — Z79899 Other long term (current) drug therapy: Secondary | ICD-10-CM | POA: Insufficient documentation

## 2021-05-11 DIAGNOSIS — D649 Anemia, unspecified: Secondary | ICD-10-CM | POA: Diagnosis not present

## 2021-05-11 DIAGNOSIS — R102 Pelvic and perineal pain: Secondary | ICD-10-CM | POA: Diagnosis not present

## 2021-05-11 DIAGNOSIS — G4700 Insomnia, unspecified: Secondary | ICD-10-CM | POA: Insufficient documentation

## 2021-05-11 DIAGNOSIS — M1990 Unspecified osteoarthritis, unspecified site: Secondary | ICD-10-CM | POA: Insufficient documentation

## 2021-05-11 DIAGNOSIS — K6289 Other specified diseases of anus and rectum: Secondary | ICD-10-CM | POA: Diagnosis not present

## 2021-05-11 DIAGNOSIS — R634 Abnormal weight loss: Secondary | ICD-10-CM | POA: Diagnosis not present

## 2021-05-11 LAB — CBC WITH DIFFERENTIAL/PLATELET
Abs Immature Granulocytes: 0.07 10*3/uL (ref 0.00–0.07)
Basophils Absolute: 0.2 10*3/uL — ABNORMAL HIGH (ref 0.0–0.1)
Basophils Relative: 1 %
Eosinophils Absolute: 0.1 10*3/uL (ref 0.0–0.5)
Eosinophils Relative: 0 %
HCT: 28.7 % — ABNORMAL LOW (ref 36.0–46.0)
Hemoglobin: 9.2 g/dL — ABNORMAL LOW (ref 12.0–15.0)
Immature Granulocytes: 0 %
Lymphocytes Relative: 82 %
Lymphs Abs: 29.2 10*3/uL — ABNORMAL HIGH (ref 0.7–4.0)
MCH: 28.3 pg (ref 26.0–34.0)
MCHC: 32.1 g/dL (ref 30.0–36.0)
MCV: 88.3 fL (ref 80.0–100.0)
Monocytes Absolute: 2.4 10*3/uL — ABNORMAL HIGH (ref 0.1–1.0)
Monocytes Relative: 7 %
Neutro Abs: 3.7 10*3/uL (ref 1.7–7.7)
Neutrophils Relative %: 10 %
Platelets: 179 10*3/uL (ref 150–400)
RBC: 3.25 MIL/uL — ABNORMAL LOW (ref 3.87–5.11)
RDW: 14.2 % (ref 11.5–15.5)
WBC: 35.6 10*3/uL — ABNORMAL HIGH (ref 4.0–10.5)
nRBC: 0 % (ref 0.0–0.2)

## 2021-05-11 LAB — COMPREHENSIVE METABOLIC PANEL
ALT: 12 U/L (ref 0–44)
AST: 23 U/L (ref 15–41)
Albumin: 3.9 g/dL (ref 3.5–5.0)
Alkaline Phosphatase: 47 U/L (ref 38–126)
Anion gap: 9 (ref 5–15)
BUN: 23 mg/dL (ref 8–23)
CO2: 26 mmol/L (ref 22–32)
Calcium: 8.6 mg/dL — ABNORMAL LOW (ref 8.9–10.3)
Chloride: 97 mmol/L — ABNORMAL LOW (ref 98–111)
Creatinine, Ser: 1.11 mg/dL — ABNORMAL HIGH (ref 0.44–1.00)
GFR, Estimated: 48 mL/min — ABNORMAL LOW (ref >60)
Glucose, Bld: 90 mg/dL (ref 70–99)
Potassium: 4.1 mmol/L (ref 3.5–5.1)
Sodium: 132 mmol/L — ABNORMAL LOW (ref 135–145)
Total Bilirubin: 0.5 mg/dL (ref 0.3–1.2)
Total Protein: 6.4 g/dL — ABNORMAL LOW (ref 6.5–8.1)

## 2021-05-11 LAB — FERRITIN: Ferritin: 17 ng/mL (ref 11–307)

## 2021-05-11 LAB — LACTATE DEHYDROGENASE: LDH: 176 U/L (ref 98–192)

## 2021-05-11 LAB — IRON AND TIBC
Iron: 34 ug/dL (ref 28–170)
Saturation Ratios: 8 % — ABNORMAL LOW (ref 10.4–31.8)
TIBC: 440 ug/dL (ref 250–450)
UIBC: 406 ug/dL

## 2021-05-11 LAB — FOLATE: Folate: 12.5 ng/mL (ref >5.9)

## 2021-05-11 LAB — VITAMIN B12: Vitamin B-12: 478 pg/mL (ref 180–914)

## 2021-05-12 DIAGNOSIS — D649 Anemia, unspecified: Secondary | ICD-10-CM | POA: Insufficient documentation

## 2021-05-12 DIAGNOSIS — D72820 Lymphocytosis (symptomatic): Secondary | ICD-10-CM | POA: Insufficient documentation

## 2021-05-12 LAB — SURGICAL PATHOLOGY

## 2021-05-25 NOTE — Progress Notes (Signed)
[place] [phone]   [phone]  PROGRESS NOTE  Patient Care Team: Asencion Noble, MD as PCP - General (Internal Medicine)  Hematological/Oncological History # Lymphocytosis/CLL # Normocytic Anemia 1) : Labs from PCP, Dr. Asencion Noble -09/03/2020: WBC 8.9, Hgb 11.3, MCV 83, Plt 244 -08/20/2020: WBC 12.6 (H), Hgb 10.4 (L), MCV 82, Plt 228 -7/12/022: WBC 12.9 (H), Hgb 8.8 (L), MCV 87, Plt 171, Lymphs Abs 27.4 (H), Mono Abs 2.3 (H)   2) 05/11/2021: Establish care at [facility]. 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 Ferritin was 17 and Sat ratio 8    Interval History:  Faith Lee 87 y.o. female with medical history significant for lymphocytosis and normocytic anemia who presents for a follow up visit. The patient's last visit was on 05/11/2021. In the interim since the last visit her labs have returned with findings concerning for iron deficiency anemia and CLL versus mantle cell lymphoma.  On exam today Faith Lee notes that her legs feel "like little sticks".  She notes that she feels weak and does not have the energy she would like.  She denies any issues with shortness of breath or chest pain.  She is not been having any overt signs of bleeding, bruising, or dark stools.  She notes she has tried iron pills before in the past but they do cause constipation.  She has baseline constipation from her irritable bowel syndrome.  She notes that she has trouble sleeping at night and if she takes a Tylenol will get sweats.  She otherwise denies any fevers, chills, sweats, nausea, vomiting or diarrhea.  A full 10 point ROS is listed below.  MEDICAL HISTORY:  Past Medical History:  Diagnosis Date   Anxiety    Hypertension    Irritable bowel disease    Osteoarthritis     SURGICAL HISTORY: Past Surgical History:  Procedure Laterality Date   ABDOMINAL HYSTERECTOMY   1974   CARDIAC CATHETERIZATION     1991 and 2005    SOCIAL HISTORY: Social History   Socioeconomic History   Marital status: Widowed    Spouse name: Not on file   Number of children: Not on file   Years of education: Not on file   Highest education level: Not on file  Occupational History   Not on file  Tobacco Use   Smoking status: Never   Smokeless tobacco: Never  Vaping Use   Vaping Use: Never used  Substance and Sexual Activity   Alcohol use: No   Drug use: No   Sexual activity: Not on file  Other Topics Concern   Not on file  Social History Narrative   Not on file   Social Determinants of Health   Financial Resource Strain: Low Risk    Difficulty of Paying Living Expenses: Not very hard  Food Insecurity: No Food Insecurity   Worried About Running Out of Food in the Last Year: Never true   Ran Out of Food in the Last Year: Never true  Transportation Needs: No Transportation Needs   Lack of Transportation (Medical): No   Lack of Transportation (Non-Medical): No  Physical Activity: Insufficiently Active   Days of Exercise per Week: 2 days   Minutes of Exercise per Session: 10 min  Stress: Stress Concern Present   Feeling of Stress : To some extent  Social Connections: Moderately Integrated   Frequency of Communication with Friends  and Family: More than three times a week   Frequency of Social Gatherings with Friends and Family: Twice a week   Attends Religious Services: More than 4 times per year   Active Member of [employer] or Organizations: Yes   Attends Archivist Meetings: 1 to 4 times per year   Marital Status: Widowed  Human resources officer Violence: Not At Risk   Fear of Current or Ex-Partner: No   Emotionally Abused: No   Physically Abused: No   Sexually Abused: No    FAMILY HISTORY: Family History  Problem Relation Age of Onset   Renal Disease Sister    Multiple myeloma Sister     ALLERGIES:  is allergic to lidocaine.  MEDICATIONS:   Current Outpatient Medications  Medication Sig Dispense Refill   acetaminophen (TYLENOL) 500 MG tablet Take 1,000 mg by mouth every 6 (six) hours as needed for moderate pain.     furosemide (LASIX) 20 MG tablet Take 20 mg by mouth daily.     LORazepam (ATIVAN) 0.5 MG tablet Take 0.5 mg by mouth 2 (two) times daily.     metoprolol tartrate (LOPRESSOR) 25 MG tablet Take 25 mg by mouth 2 (two) times daily.     MODERNA COVID-19 VACCINE 100 MCG/0.5ML injection      potassium chloride (MICRO-K) 10 MEQ CR capsule Take 10 mEq by mouth daily.     No current facility-administered medications for this visit.    REVIEW OF SYSTEMS:   Constitutional: ( - ) fevers, ( - )  chills , ( - ) night sweats Eyes: ( - ) blurriness of vision, ( - ) double vision, ( - ) watery eyes Ears, nose, mouth, throat, and face: ( - ) mucositis, ( - ) sore throat Respiratory: ( - ) cough, ( - ) dyspnea, ( - ) wheezes Cardiovascular: ( - ) palpitation, ( - ) chest discomfort, ( - ) lower extremity swelling Gastrointestinal:  ( - ) nausea, ( - ) heartburn, ( - ) change in bowel habits Skin: ( - ) abnormal skin rashes Lymphatics: ( - ) new lymphadenopathy, ( - ) easy bruising Neurological: ( - ) numbness, ( - ) tingling, ( - ) new weaknesses Behavioral/Psych: ( - ) mood change, ( - ) new changes  All other systems were reviewed with the patient and are negative.  PHYSICAL EXAMINATION: ECOG PERFORMANCE STATUS: 1 - Symptomatic but completely ambulatory  Vitals:   05/26/21 1129  BP: (!) 123/53  Pulse: 90  Resp: 18  Temp: (!) 97 F (36.1 C)  SpO2: 100%   Filed Weights   05/26/21 1129  Weight: 163 lb 3.2 oz (74 kg)    GENERAL: alert, no distress and comfortable SKIN: skin color, texture, turgor are normal, no rashes or significant lesions EYES: conjunctiva are pink and non-injected, sclera clear OROPHARYNX: no exudate, no erythema; lips, buccal mucosa, and tongue normal  NECK: supple, non-tender LYMPH:  no  palpable lymphadenopathy in the cervical, axillary or inguinal LUNGS: clear to auscultation and percussion with normal breathing effort HEART: regular rate & rhythm and no murmurs and no lower extremity edema ABDOMEN: soft, non-tender, non-distended, normal bowel sounds Musculoskeletal: no cyanosis of digits and no clubbing  PSYCH: alert & oriented x 3, fluent speech NEURO: no focal motor/sensory deficits  LABORATORY DATA:  I have reviewed the data as listed CBC Latest Ref Rng & Units 05/11/2021 02/06/2021 08/01/2015  WBC 4.0 - 10.5 K/uL 35.6(H) 15.5(H) 5.4  Hemoglobin 12.0 - 15.0  g/dL 9.2(L) 8.7(L) 11.5(L)  Hematocrit 36.0 - 46.0 % 28.7(L) 26.4(L) 35.0(L)  Platelets 150 - 400 K/uL 179 133(L) 271    CMP Latest Ref Rng & Units 05/11/2021 02/06/2021 08/01/2015  Glucose 70 - 99 mg/dL 90 117(H) 120(H)  BUN 8 - 23 mg/dL $Remove'23 16 10  'ZFHtISu$ Creatinine 0.44 - 1.00 mg/dL 1.11(H) 0.98 0.81  Sodium 135 - 145 mmol/L 132(L) 129(L) 139  Potassium 3.5 - 5.1 mmol/L 4.1 4.2 4.1  Chloride 98 - 111 mmol/L 97(L) 94(L) 101  CO2 22 - 32 mmol/L $RemoveB'26 24 31  'tLtupkGq$ Calcium 8.9 - 10.3 mg/dL 8.6(L) 8.2(L) 8.9  Total Protein 6.5 - 8.1 g/dL 6.4(L) - 6.7  Total Bilirubin 0.3 - 1.2 mg/dL 0.5 - 0.7  Alkaline Phos 38 - 126 U/L 47 - 42  AST 15 - 41 U/L 23 - 20  ALT 0 - 44 U/L 12 - 13(L)    RADIOGRAPHIC STUDIES: No results found.  ASSESSMENT & PLAN Faith Lee 87 y.o. female with medical history significant for lymphocytosis and normocytic anemia who presents for a follow up visit.  # Lymphocytosis # CLL vs Mantle Cell Lymphoma -- Based on the flow cytometry this is not clear if it is a CLL versus mantle cell lymphoma.  At this time favor CLL.  We will order CLL prognostic panel as well as T11 14 in order to rule out mantle cell --Unclear if the patient's anemia is being caused by iron deficiency versus CLL.  We will correct the iron deficiency first and order sure the treatment is not required for the CLL --We will send  off her prognostic panel today.  #Iron deficiency anemia --Patient's findings this time are consistent with iron deficiency anemia of unclear etiology.  She has a low ferritin and iron sat. --She has a history of constipation with p.o. iron and has irritable bowel syndrome --Recommend proceeding with IV Feraheme 500 mg q. 7 days x 2 doses in order to bolster her iron levels. --Return to clinic approximately 2 to 4 weeks time after last dose of IV iron in order to assure it is working. --Can consider GI evaluation for her iron deficiency anemia if it does not correct with IV iron therapy.  Would be hesitant to recommend GI procedures given her advanced age.  Orders Placed This Encounter  Procedures   FISH, CLL Prognostic Panel    Standing Status:   Future    Standing Expiration Date:   05/26/2022   IgVH Somatic Hypermutation    Standing Status:   Future    Standing Expiration Date:   05/26/2022   Miscellaneous [facility] test (send-out)    Standing Status:   Future    Standing Expiration Date:   05/26/2022    Order Specific Question:   Test name / description:    Answer:   [place], t(11;14) (CPT [id number], [id number], [phone] )   CBC with Differential ([place] Only)    Standing Status:   Future    Standing Expiration Date:   05/26/2022   CMP ([place] only)    Standing Status:   Future    Standing Expiration Date:   05/26/2022   Lactate dehydrogenase (LDH)    Standing Status:   Future    Standing Expiration Date:   05/26/2022    All questions were answered. The patient knows to call the clinic with any problems, questions or concerns.  A total of more than 30 minutes were spent on this encounter with face-to-face time  and non-face-to-face time, including preparing to see the patient, ordering tests and/or medications, counseling the patient and coordination of care as outlined above.   Ledell Peoples, MD Department of Hematology/Oncology [place] [facility] Phone: [phone] Pager: [phone] Email: [email or web address]  05/26/2021 12:25 PM

## 2021-05-26 ENCOUNTER — Inpatient Hospital Stay (HOSPITAL_COMMUNITY): Payer: Medicare Other | Attending: Physician Assistant | Admitting: Hematology and Oncology

## 2021-05-26 ENCOUNTER — Inpatient Hospital Stay (HOSPITAL_COMMUNITY): Payer: Medicare Other

## 2021-05-26 ENCOUNTER — Other Ambulatory Visit: Payer: Self-pay

## 2021-05-26 VITALS — BP 123/53 | HR 90 | Temp 97.0°F | Resp 18 | Wt 163.2 lb

## 2021-05-26 DIAGNOSIS — K589 Irritable bowel syndrome without diarrhea: Secondary | ICD-10-CM | POA: Diagnosis not present

## 2021-05-26 DIAGNOSIS — Z79899 Other long term (current) drug therapy: Secondary | ICD-10-CM | POA: Insufficient documentation

## 2021-05-26 DIAGNOSIS — M1990 Unspecified osteoarthritis, unspecified site: Secondary | ICD-10-CM | POA: Insufficient documentation

## 2021-05-26 DIAGNOSIS — D500 Iron deficiency anemia secondary to blood loss (chronic): Secondary | ICD-10-CM | POA: Insufficient documentation

## 2021-05-26 DIAGNOSIS — D649 Anemia, unspecified: Secondary | ICD-10-CM

## 2021-05-26 DIAGNOSIS — Z806 Family history of leukemia: Secondary | ICD-10-CM | POA: Diagnosis not present

## 2021-05-26 DIAGNOSIS — D509 Iron deficiency anemia, unspecified: Secondary | ICD-10-CM | POA: Insufficient documentation

## 2021-05-26 DIAGNOSIS — D72820 Lymphocytosis (symptomatic): Secondary | ICD-10-CM | POA: Diagnosis not present

## 2021-05-26 DIAGNOSIS — F419 Anxiety disorder, unspecified: Secondary | ICD-10-CM | POA: Diagnosis not present

## 2021-05-26 LAB — COMPREHENSIVE METABOLIC PANEL
ALT: 13 U/L (ref 0–44)
AST: 26 U/L (ref 15–41)
Albumin: 4.2 g/dL (ref 3.5–5.0)
Alkaline Phosphatase: 52 U/L (ref 38–126)
Anion gap: 9 (ref 5–15)
BUN: 18 mg/dL (ref 8–23)
CO2: 28 mmol/L (ref 22–32)
Calcium: 9.1 mg/dL (ref 8.9–10.3)
Chloride: 95 mmol/L — ABNORMAL LOW (ref 98–111)
Creatinine, Ser: 0.93 mg/dL (ref 0.44–1.00)
GFR, Estimated: 59 mL/min — ABNORMAL LOW (ref >60)
Glucose, Bld: 80 mg/dL (ref 70–99)
Potassium: 4.1 mmol/L (ref 3.5–5.1)
Sodium: 132 mmol/L — ABNORMAL LOW (ref 135–145)
Total Bilirubin: 0.7 mg/dL (ref 0.3–1.2)
Total Protein: 6.7 g/dL (ref 6.5–8.1)

## 2021-05-26 LAB — CBC
HCT: 28.2 % — ABNORMAL LOW (ref 36.0–46.0)
Hemoglobin: 9.2 g/dL — ABNORMAL LOW (ref 12.0–15.0)
MCH: 28.8 pg (ref 26.0–34.0)
MCHC: 32.6 g/dL (ref 30.0–36.0)
MCV: 88.4 fL (ref 80.0–100.0)
Platelets: 174 10*3/uL (ref 150–400)
RBC: 3.19 MIL/uL — ABNORMAL LOW (ref 3.87–5.11)
RDW: 14.1 % (ref 11.5–15.5)
WBC: 35.4 10*3/uL — ABNORMAL HIGH (ref 4.0–10.5)
nRBC: 0 % (ref 0.0–0.2)

## 2021-05-26 LAB — DIFFERENTIAL
Abs Immature Granulocytes: 0.08 10*3/uL — ABNORMAL HIGH (ref 0.00–0.07)
Basophils Absolute: 0 10*3/uL (ref 0.0–0.1)
Basophils Relative: 0 %
Eosinophils Absolute: 0.1 10*3/uL (ref 0.0–0.5)
Eosinophils Relative: 0 %
Immature Granulocytes: 0 %
Lymphocytes Relative: 82 %
Lymphs Abs: 29 10*3/uL — ABNORMAL HIGH (ref 0.7–4.0)
Monocytes Absolute: 2.4 10*3/uL — ABNORMAL HIGH (ref 0.1–1.0)
Monocytes Relative: 7 %
Neutro Abs: 3.9 10*3/uL (ref 1.7–7.7)
Neutrophils Relative %: 11 %

## 2021-05-26 LAB — LACTATE DEHYDROGENASE: LDH: 191 U/L (ref 98–192)

## 2021-05-27 ENCOUNTER — Other Ambulatory Visit (HOSPITAL_COMMUNITY): Payer: Self-pay | Admitting: Physician Assistant

## 2021-05-27 ENCOUNTER — Encounter (HOSPITAL_COMMUNITY): Payer: Self-pay | Admitting: Hematology and Oncology

## 2021-05-28 ENCOUNTER — Other Ambulatory Visit (HOSPITAL_COMMUNITY): Payer: Self-pay

## 2021-05-28 DIAGNOSIS — D500 Iron deficiency anemia secondary to blood loss (chronic): Secondary | ICD-10-CM

## 2021-05-28 DIAGNOSIS — D649 Anemia, unspecified: Secondary | ICD-10-CM

## 2021-05-28 DIAGNOSIS — D72820 Lymphocytosis (symptomatic): Secondary | ICD-10-CM

## 2021-05-29 ENCOUNTER — Ambulatory Visit (HOSPITAL_COMMUNITY): Payer: BC Managed Care – PPO

## 2021-05-29 ENCOUNTER — Other Ambulatory Visit: Payer: Self-pay

## 2021-05-29 ENCOUNTER — Inpatient Hospital Stay (HOSPITAL_COMMUNITY): Payer: Medicare Other

## 2021-05-29 VITALS — BP 132/55 | HR 76 | Temp 97.9°F | Resp 18

## 2021-05-29 DIAGNOSIS — K589 Irritable bowel syndrome without diarrhea: Secondary | ICD-10-CM | POA: Diagnosis not present

## 2021-05-29 DIAGNOSIS — D500 Iron deficiency anemia secondary to blood loss (chronic): Secondary | ICD-10-CM

## 2021-05-29 DIAGNOSIS — Z806 Family history of leukemia: Secondary | ICD-10-CM | POA: Diagnosis not present

## 2021-05-29 DIAGNOSIS — D72820 Lymphocytosis (symptomatic): Secondary | ICD-10-CM | POA: Diagnosis not present

## 2021-05-29 DIAGNOSIS — D509 Iron deficiency anemia, unspecified: Secondary | ICD-10-CM | POA: Diagnosis not present

## 2021-05-29 DIAGNOSIS — M1990 Unspecified osteoarthritis, unspecified site: Secondary | ICD-10-CM | POA: Diagnosis not present

## 2021-05-29 DIAGNOSIS — Z79899 Other long term (current) drug therapy: Secondary | ICD-10-CM | POA: Diagnosis not present

## 2021-05-29 MED ORDER — SODIUM CHLORIDE 0.9 % IV SOLN
300.0000 mg | Freq: Once | INTRAVENOUS | Status: AC
  Administered 2021-05-29: 300 mg via INTRAVENOUS
  Filled 2021-05-29: qty 300

## 2021-05-29 MED ORDER — LORATADINE 10 MG PO TABS
10.0000 mg | ORAL_TABLET | Freq: Once | ORAL | Status: AC
  Administered 2021-05-29: 10 mg via ORAL
  Filled 2021-05-29: qty 1

## 2021-05-29 MED ORDER — SODIUM CHLORIDE 0.9 % IV SOLN: Freq: Once | INTRAVENOUS | Status: AC

## 2021-05-29 NOTE — Patient Instructions (Addendum)
Faith Lee  Discharge Instructions: Thank you for choosing [facility] to provide your oncology and hematology care.  If you have a lab appointment with the [facility], please come in thru the Main Entrance and check in at the main information desk.  Wear comfortable clothing and clothing appropriate for easy access to any Portacath or PICC line.   We strive to give you quality time with your provider. You may need to reschedule your appointment if you arrive late (15 or more minutes).  Arriving late affects you and other patients whose appointments are after yours.  Also, if you miss three or more appointments without notifying the office, you may be dismissed from the clinic at the provider's discretion.      For prescription refill requests, have your pharmacy contact our office and allow 72 hours for refills to be completed.    Today you received IV iron inf     BELOW ARE SYMPTOMS THAT SHOULD BE REPORTED IMMEDIATELY: *FEVER GREATER THAN 100.4 F (38 C) OR HIGHER *CHILLS OR SWEATING *NAUSEA AND VOMITING THAT IS NOT CONTROLLED WITH YOUR NAUSEA MEDICATION *UNUSUAL SHORTNESS OF BREATH *UNUSUAL BRUISING OR BLEEDING *URINARY PROBLEMS (pain or burning when urinating, or frequent urination) *BOWEL PROBLEMS (unusual diarrhea, constipation, pain near the anus) TENDERNESS IN MOUTH AND THROAT WITH OR WITHOUT PRESENCE OF ULCERS (sore throat, sores in mouth, or a toothache) UNUSUAL RASH, SWELLING OR PAIN  UNUSUAL VAGINAL DISCHARGE OR ITCHING   Items with * indicate a potential emergency and should be followed up as soon as possible or go to the Emergency Department if any problems should occur.  Please show the CHEMOTHERAPY ALERT CARD or IMMUNOTHERAPY ALERT CARD at check-in to the Emergency Department and triage nurse.  Should you have questions after your visit or need to cancel or reschedule your appointment, please contact [facility] [phone]   and follow the prompts.  Office hours are 8:00 a.m. to 4:30 p.m. Monday - Friday. Please note that voicemails left after 4:00 p.m. may not be returned until the following business day.  We are closed weekends and major holidays. You have access to a nurse at all times for urgent questions. Please call the main number to the clinic [phone] and follow the prompts.  For any non-urgent questions, you may also contact your provider using MyChart. We now offer e-Visits for anyone 70 and older to request care online for non-urgent symptoms. For details visit [email or web address].   Also download the MyChart app! Go to the app store, search "MyChart", open the app, select [facility], and log in with your MyChart username and password.  Due to Covid, a mask is required upon entering the hospital/clinic. If you do not have a mask, one will be given to you upon arrival. For doctor visits, patients may have 1 support person aged 29 or older with them. For treatment visits, patients cannot have anyone with them due to current Covid guidelines and our immunocompromised population. Faith Lee  Discharge Instructions: Thank you for choosing [facility] to provide your oncology and hematology care.  If you have a lab appointment with the [facility], please come in thru the Main Entrance and check in at the main information desk.  Wear comfortable clothing and clothing appropriate for easy access to any Portacath or PICC line.   We strive to give you quality time with your provider. You may need to reschedule your appointment if you  arrive late (15 or more minutes).  Arriving late affects you and other patients whose appointments are after yours.  Also, if you miss three or more appointments without notifying the office, you may be dismissed from the clinic at the provider's discretion.      For prescription refill requests, have your pharmacy contact our office and allow 72 hours  for refills to be completed.    Today you received IV iron infusion.    BELOW ARE SYMPTOMS THAT SHOULD BE REPORTED IMMEDIATELY: *FEVER GREATER THAN 100.4 F (38 C) OR HIGHER *CHILLS OR SWEATING *NAUSEA AND VOMITING THAT IS NOT CONTROLLED WITH YOUR NAUSEA MEDICATION *UNUSUAL SHORTNESS OF BREATH *UNUSUAL BRUISING OR BLEEDING *URINARY PROBLEMS (pain or burning when urinating, or frequent urination) *BOWEL PROBLEMS (unusual diarrhea, constipation, pain near the anus) TENDERNESS IN MOUTH AND THROAT WITH OR WITHOUT PRESENCE OF ULCERS (sore throat, sores in mouth, or a toothache) UNUSUAL RASH, SWELLING OR PAIN  UNUSUAL VAGINAL DISCHARGE OR ITCHING   Items with * indicate a potential emergency and should be followed up as soon as possible or go to the Emergency Department if any problems should occur.  Please show the CHEMOTHERAPY ALERT CARD or IMMUNOTHERAPY ALERT CARD at check-in to the Emergency Department and triage nurse.  Should you have questions after your visit or need to cancel or reschedule your appointment, please contact [facility] [phone]  and follow the prompts.  Office hours are 8:00 a.m. to 4:30 p.m. Monday - Friday. Please note that voicemails left after 4:00 p.m. may not be returned until the following business day.  We are closed weekends and major holidays. You have access to a nurse at all times for urgent questions. Please call the main number to the clinic [phone] and follow the prompts.  For any non-urgent questions, you may also contact your provider using MyChart. We now offer e-Visits for anyone 44 and older to request care online for non-urgent symptoms. For details visit [email or web address].   Also download the MyChart app! Go to the app store, search "MyChart", open the app, select [facility], and log in with your MyChart username and password.  Due to Covid, a mask is required upon entering the hospital/clinic. If you do not have a mask,  one will be given to you upon arrival. For doctor visits, patients may have 1 support person aged 5 or older with them. For treatment visits, patients cannot have anyone with them due to current Covid guidelines and our immunocompromised population.

## 2021-05-29 NOTE — Progress Notes (Signed)
Pt presents today for first IV iron infusion Venofer 300mg  per provider's order. Vital signs stable and pt voiced no new complaints at this time. Pt took extra strength Tylenol at 1100 prior to arrival C.Jones-Pharmacist made aware.   Venofer given today per MD orders. Tolerated infusion without adverse affects. Vital signs stable. No complaints at this time. Discharged from clinic ambulatory in stable condition. Alert and oriented x 3. F/U with [facility] as scheduled.

## 2021-06-01 LAB — IGVH SOMATIC HYPERMUTATION

## 2021-06-04 LAB — FISH HES LEUKEMIA, 4Q12 REA
Cells Analyzed: 100
Cells Counted:: 100

## 2021-06-05 ENCOUNTER — Inpatient Hospital Stay (HOSPITAL_COMMUNITY): Payer: Medicare Other

## 2021-06-05 ENCOUNTER — Ambulatory Visit (HOSPITAL_COMMUNITY): Payer: BC Managed Care – PPO

## 2021-06-05 ENCOUNTER — Other Ambulatory Visit: Payer: Self-pay

## 2021-06-05 ENCOUNTER — Encounter (HOSPITAL_COMMUNITY): Payer: Self-pay

## 2021-06-05 VITALS — BP 133/61 | HR 69 | Temp 98.0°F | Resp 18

## 2021-06-05 DIAGNOSIS — Z806 Family history of leukemia: Secondary | ICD-10-CM | POA: Diagnosis not present

## 2021-06-05 DIAGNOSIS — D72820 Lymphocytosis (symptomatic): Secondary | ICD-10-CM | POA: Diagnosis not present

## 2021-06-05 DIAGNOSIS — Z79899 Other long term (current) drug therapy: Secondary | ICD-10-CM | POA: Diagnosis not present

## 2021-06-05 DIAGNOSIS — D500 Iron deficiency anemia secondary to blood loss (chronic): Secondary | ICD-10-CM

## 2021-06-05 DIAGNOSIS — M1990 Unspecified osteoarthritis, unspecified site: Secondary | ICD-10-CM | POA: Diagnosis not present

## 2021-06-05 DIAGNOSIS — D509 Iron deficiency anemia, unspecified: Secondary | ICD-10-CM | POA: Diagnosis not present

## 2021-06-05 DIAGNOSIS — K589 Irritable bowel syndrome without diarrhea: Secondary | ICD-10-CM | POA: Diagnosis not present

## 2021-06-05 LAB — FISH ONCOLOGY
Cells Analyzed: 100
Cells Counted: 100

## 2021-06-05 MED ORDER — SODIUM CHLORIDE 0.9 % IV SOLN
300.0000 mg | Freq: Once | INTRAVENOUS | Status: AC
  Administered 2021-06-05: 300 mg via INTRAVENOUS
  Filled 2021-06-05: qty 300

## 2021-06-05 MED ORDER — LORATADINE 10 MG PO TABS
10.0000 mg | ORAL_TABLET | Freq: Once | ORAL | Status: AC
  Administered 2021-06-05: 10 mg via ORAL
  Filled 2021-06-05: qty 1

## 2021-06-05 MED ORDER — SODIUM CHLORIDE 0.9 % IV SOLN: Freq: Once | INTRAVENOUS | Status: AC

## 2021-06-05 MED ORDER — ACETAMINOPHEN 325 MG PO TABS
650.0000 mg | ORAL_TABLET | Freq: Once | ORAL | Status: AC
  Administered 2021-06-05: 650 mg via ORAL
  Filled 2021-06-05: qty 2

## 2021-06-05 NOTE — Progress Notes (Signed)
Patient tolerated iron infusion with no complaints voiced. Peripheral IV site clean and dry with good blood return noted before and after infusion. Band aid applied. VSS with discharge and left in satisfactory condition via wheelchair with no s/s of distress noted.

## 2021-06-05 NOTE — Patient Instructions (Signed)
[facility]  Discharge Instructions: Thank you for choosing [facility] to provide your oncology and hematology care.  If you have a lab appointment with the Cancer Center, please come in thru the Main Entrance and check in at the main information desk.  Wear comfortable clothing and clothing appropriate for easy access to any Portacath or PICC line.   We strive to give you quality time with your provider. You may need to reschedule your appointment if you arrive late (15 or more minutes).  Arriving late affects you and other patients whose appointments are after yours.  Also, if you miss three or more appointments without notifying the office, you may be dismissed from the clinic at the provider's discretion.      For prescription refill requests, have your pharmacy contact our office and allow 72 hours for refills to be completed.    Today you received the following: Venofer, return as scheduled.   To help prevent nausea and vomiting after your treatment, we encourage you to take your nausea medication as directed.  BELOW ARE SYMPTOMS THAT SHOULD BE REPORTED IMMEDIATELY: *FEVER GREATER THAN 100.4 F (38 C) OR HIGHER *CHILLS OR SWEATING *NAUSEA AND VOMITING THAT IS NOT CONTROLLED WITH YOUR NAUSEA MEDICATION *UNUSUAL SHORTNESS OF BREATH *UNUSUAL BRUISING OR BLEEDING *URINARY PROBLEMS (pain or burning when urinating, or frequent urination) *BOWEL PROBLEMS (unusual diarrhea, constipation, pain near the anus) TENDERNESS IN MOUTH AND THROAT WITH OR WITHOUT PRESENCE OF ULCERS (sore throat, sores in mouth, or a toothache) UNUSUAL RASH, SWELLING OR PAIN  UNUSUAL VAGINAL DISCHARGE OR ITCHING   Items with * indicate a potential emergency and should be followed up as soon as possible or go to the Emergency Department if any problems should occur.  Please show the CHEMOTHERAPY ALERT CARD or IMMUNOTHERAPY ALERT CARD at check-in to the Emergency Department and triage  nurse.  Should you have questions after your visit or need to cancel or reschedule your appointment, please contact [facility] [phone]  and follow the prompts.  Office hours are 8:00 a.m. to 4:30 p.m. Monday - Friday. Please note that voicemails left after 4:00 p.m. may not be returned until the following business day.  We are closed weekends and major holidays. You have access to a nurse at all times for urgent questions. Please call the main number to the clinic [phone] and follow the prompts.  For any non-urgent questions, you may also contact your provider using MyChart. We now offer e-Visits for anyone 18 and older to request care online for non-urgent symptoms. For details visit [email or web address].   Also download the MyChart app! Go to the app store, search "MyChart", open the app, select [facility], and log in with your MyChart username and password.  Due to Covid, a mask is required upon entering the hospital/clinic. If you do not have a mask, one will be given to you upon arrival. For doctor visits, patients may have 1 support person aged 18 or older with them. For treatment visits, patients cannot have anyone with them due to current Covid guidelines and our immunocompromised population.  

## 2021-06-12 ENCOUNTER — Ambulatory Visit (HOSPITAL_COMMUNITY): Payer: BC Managed Care – PPO

## 2021-06-12 ENCOUNTER — Ambulatory Visit (HOSPITAL_COMMUNITY): Payer: Medicare Other

## 2021-06-17 DIAGNOSIS — I10 Essential (primary) hypertension: Secondary | ICD-10-CM | POA: Diagnosis not present

## 2021-06-19 ENCOUNTER — Ambulatory Visit (HOSPITAL_COMMUNITY): Payer: BC Managed Care – PPO | Admitting: Physician Assistant

## 2021-06-19 ENCOUNTER — Encounter (HOSPITAL_COMMUNITY): Payer: Self-pay

## 2021-06-19 ENCOUNTER — Other Ambulatory Visit: Payer: Self-pay

## 2021-06-19 ENCOUNTER — Inpatient Hospital Stay (HOSPITAL_COMMUNITY): Payer: Medicare Other | Attending: Physician Assistant

## 2021-06-19 VITALS — BP 138/55 | HR 75 | Temp 98.7°F | Resp 18

## 2021-06-19 DIAGNOSIS — Z806 Family history of leukemia: Secondary | ICD-10-CM | POA: Diagnosis not present

## 2021-06-19 DIAGNOSIS — K589 Irritable bowel syndrome without diarrhea: Secondary | ICD-10-CM | POA: Diagnosis not present

## 2021-06-19 DIAGNOSIS — C9110 Chronic lymphocytic leukemia of B-cell type not having achieved remission: Secondary | ICD-10-CM | POA: Insufficient documentation

## 2021-06-19 DIAGNOSIS — D72820 Lymphocytosis (symptomatic): Secondary | ICD-10-CM | POA: Diagnosis not present

## 2021-06-19 DIAGNOSIS — I10 Essential (primary) hypertension: Secondary | ICD-10-CM | POA: Diagnosis not present

## 2021-06-19 DIAGNOSIS — D649 Anemia, unspecified: Secondary | ICD-10-CM | POA: Insufficient documentation

## 2021-06-19 DIAGNOSIS — D500 Iron deficiency anemia secondary to blood loss (chronic): Secondary | ICD-10-CM

## 2021-06-19 DIAGNOSIS — M1990 Unspecified osteoarthritis, unspecified site: Secondary | ICD-10-CM | POA: Insufficient documentation

## 2021-06-19 DIAGNOSIS — Z79899 Other long term (current) drug therapy: Secondary | ICD-10-CM | POA: Insufficient documentation

## 2021-06-19 MED ORDER — SODIUM CHLORIDE 0.9 % IV SOLN
400.0000 mg | Freq: Once | INTRAVENOUS | Status: AC
  Administered 2021-06-19: 400 mg via INTRAVENOUS
  Filled 2021-06-19: qty 20

## 2021-06-19 MED ORDER — SODIUM CHLORIDE 0.9 % IV SOLN: Freq: Once | INTRAVENOUS | Status: AC

## 2021-06-19 MED ORDER — ACETAMINOPHEN 325 MG PO TABS
650.0000 mg | ORAL_TABLET | Freq: Once | ORAL | Status: AC
  Administered 2021-06-19: 650 mg via ORAL
  Filled 2021-06-19: qty 2

## 2021-06-19 MED ORDER — LORATADINE 10 MG PO TABS
10.0000 mg | ORAL_TABLET | Freq: Once | ORAL | Status: AC
  Administered 2021-06-19: 10 mg via ORAL
  Filled 2021-06-19: qty 1

## 2021-06-19 NOTE — Patient Instructions (Signed)
[facility]  Discharge Instructions: Thank you for choosing [facility] to provide your oncology and hematology care.  If you have a lab appointment with the [facility], please come in thru the Main Entrance and check in at the main information desk.  Wear comfortable clothing and clothing appropriate for easy access to any Portacath or PICC line.   We strive to give you quality time with your provider. You may need to reschedule your appointment if you arrive late (15 or more minutes).  Arriving late affects you and other patients whose appointments are after yours.  Also, if you miss three or more appointments without notifying the office, you may be dismissed from the clinic at the provider's discretion.      For prescription refill requests, have your pharmacy contact our office and allow 72 hours for refills to be completed.    Today you received the following: Venofer 400 mg .       To help prevent nausea and vomiting after your treatment, we encourage you to take your nausea medication as directed.  BELOW ARE SYMPTOMS THAT SHOULD BE REPORTED IMMEDIATELY: *FEVER GREATER THAN 100.4 F (38 C) OR HIGHER *CHILLS OR SWEATING *NAUSEA AND VOMITING THAT IS NOT CONTROLLED WITH YOUR NAUSEA MEDICATION *UNUSUAL SHORTNESS OF BREATH *UNUSUAL BRUISING OR BLEEDING *URINARY PROBLEMS (pain or burning when urinating, or frequent urination) *BOWEL PROBLEMS (unusual diarrhea, constipation, pain near the anus) TENDERNESS IN MOUTH AND THROAT WITH OR WITHOUT PRESENCE OF ULCERS (sore throat, sores in mouth, or a toothache) UNUSUAL RASH, SWELLING OR PAIN  UNUSUAL VAGINAL DISCHARGE OR ITCHING   Items with * indicate a potential emergency and should be followed up as soon as possible or go to the Emergency Department if any problems should occur.  Please show the CHEMOTHERAPY ALERT CARD or IMMUNOTHERAPY ALERT CARD at check-in to the Emergency Department and triage nurse.  Should  you have questions after your visit or need to cancel or reschedule your appointment, please contact [facility] [phone]  and follow the prompts.  Office hours are 8:00 a.m. to 4:30 p.m. Monday - Friday. Please note that voicemails left after 4:00 p.m. may not be returned until the following business day.  We are closed weekends and major holidays. You have access to a nurse at all times for urgent questions. Please call the main number to the clinic [phone] and follow the prompts.  For any non-urgent questions, you may also contact your provider using MyChart. We now offer e-Visits for anyone 71 and older to request care online for non-urgent symptoms. For details visit [email or web address].   Also download the MyChart app! Go to the app store, search "MyChart", open the app, select [facility], and log in with your MyChart username and password.  Due to Covid, a mask is required upon entering the hospital/clinic. If you do not have a mask, one will be given to you upon arrival. For doctor visits, patients may have 1 support person aged 66 or older with them. For treatment visits, patients cannot have anyone with them due to current Covid guidelines and our immunocompromised population.

## 2021-06-19 NOTE — Progress Notes (Signed)
Patient presents today for Venofer 400 mgs IV iron infusion. Vital signs are stable. Patient denies any changes since the last iron infusion. Patient denies any complaints today. MAR reviewed and updated.    Patient complain of knots in her right upper arm. Upon assessment knot felt . Patient teaching performed that it was unlikely due to the infusion. Upper left arm assessed and knot felt on upper Left arm . Patient states , " I know what it is, when I have to have a BM my arms and body hurt, I have irritable bowel." Patient teaching performed.   Patient's daughter came to the doorway and called out to state her mother's arm was hurting at the catheter insertion site.  No swelling or redness noted at site. Iron infusion complete and patient on 30 minute post infusion wait time with Normal Saline running only.  Patient requested IV  removal. IV removed at this time.   Venofer 400 mg given today per MD orders. Tolerated infusion without adverse affects. Vital signs stable. No complaints at this time. Discharged from clinic by wheel chair in stable condition. Alert and oriented x 3. F/U with [facility] as scheduled.

## 2021-06-27 NOTE — Progress Notes (Signed)
Faith Lee, [place] [address]   CLINIC:  Medical Oncology/Hematology  PCP:  Asencion Noble, MD [address] / [place] [address]  [phone]  REASON FOR VISIT:  Follow-up for CLL, lymphocytosis, and normocytic anemia   PRIOR THERAPY: none  CURRENT THERAPY: Intermittent IV Iron (Venofer)  INTERVAL HISTORY:  Faith Lee, a 87 y.o. female, returns for routine follow-up for her CLL, lymphocytosis, and normocytic anemia . Faith Lee was last seen on 05/26/2021.  Today she reports feeling well, and she is accompanied by her daughter. She lives at home alone, and she is able to do all her typical home activities. She has received IV Iron (Venofer) infusions and reports that improved her energy levels. She denies fevers, night sweats, bleeding issues, and weight loss. Her weight has been stable over the past 6 months. She reports swelling in her feet bilaterally. She has a sister with multiple myeloma, but she otherwise denies family history of cancer. 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 She denies history of smoking.   REVIEW OF SYSTEMS:  Review of Systems  Constitutional:  Negative for appetite change (50%), fatigue (50%), fever and unexpected weight change.  HENT:   Negative for nosebleeds.   Respiratory:  Negative for hemoptysis.   Cardiovascular:  Positive for leg swelling (feet).  Gastrointestinal:  Negative for blood in stool.  Genitourinary:  Negative for hematuria.   Neurological:  Positive for headaches (occasional).  Hematological:  Does not bruise/bleed easily.  All other systems reviewed and are negative.  PAST MEDICAL/SURGICAL HISTORY:  Past Medical History:  Diagnosis Date   Anxiety    Hypertension    Irritable bowel disease    Osteoarthritis    Past Surgical History:  Procedure Laterality Date   ABDOMINAL HYSTERECTOMY  1974   CARDIAC CATHETERIZATION     1991 and 2005     SOCIAL HISTORY:  Social History   Socioeconomic History   Marital status: Widowed    Spouse name: Not on file   Number of children: Not on file   Years of education: Not on file   Highest education level: Not on file  Occupational History   Not on file  Tobacco Use   Smoking status: Never   Smokeless tobacco: Never  Vaping Use   Vaping Use: Never used  Substance and Sexual Activity   Alcohol use: No   Drug use: No   Sexual activity: Not on file  Other Topics Concern   Not on file  Social History Narrative   Not on file   Social Determinants of Health   Financial Resource Strain: Low Risk    Difficulty of Paying Living Expenses: Not very hard  Food Insecurity: No Food Insecurity   Worried About Running Out of Food in the Last Year: Never true   Ran Out of Food in the Last Year: Never true  Transportation Needs: No Transportation Needs   Lack of Transportation (Medical): No   Lack of Transportation (Non-Medical): No  Physical Activity: Insufficiently Active   Days of Exercise per Week: 2 days   Minutes of Exercise per Session: 10 min  Stress: Stress Concern Present   Feeling of Stress : To some extent  Social Connections: Moderately Integrated   Frequency of Communication with Friends and Family: More than three times a week   Frequency of Social Gatherings with Friends and Family: Twice a week   Attends Religious Services: More  than 4 times per year   Active Member of Clubs or Organizations: Yes   Attends Archivist Meetings: 1 to 4 times per year   Marital Status: Widowed  Human resources officer Violence: Not At Risk   Fear of Current or Ex-Partner: No   Emotionally Abused: No   Physically Abused: No   Sexually Abused: No    FAMILY HISTORY:  Family History  Problem Relation Age of Onset   Renal Disease Sister    Multiple myeloma Sister     CURRENT MEDICATIONS:  Current Outpatient Medications  Medication Sig Dispense Refill   acetaminophen  (TYLENOL) 500 MG tablet Take 1,000 mg by mouth every 6 (six) hours as needed for moderate pain.     furosemide (LASIX) 20 MG tablet Take 20 mg by mouth daily.     LORazepam (ATIVAN) 0.5 MG tablet Take 0.5 mg by mouth 2 (two) times daily.     metoprolol tartrate (LOPRESSOR) 25 MG tablet Take 25 mg by mouth 2 (two) times daily.     MODERNA COVID-19 VACCINE 100 MCG/0.5ML injection      olmesartan-hydrochlorothiazide (BENICAR HCT) 40-25 MG tablet Take 1 tablet by mouth daily.     potassium chloride (MICRO-K) 10 MEQ CR capsule Take 10 mEq by mouth daily.     No current facility-administered medications for this visit.    ALLERGIES:  Allergies  Allergen Reactions   Lidocaine Other (See Comments) and Hypertension    Patient had chest tightness with stomach pain, numbness on left side of limbs and unable to speak    PHYSICAL EXAM:  Performance status (ECOG): 1 - Symptomatic but completely ambulatory  Vitals:   06/29/21 1450  BP: (!) 130/58  Pulse: 74  Resp: 18  Temp: (!) 97 F (36.1 C)  SpO2: 100%   Wt Readings from Last 3 Encounters:  06/29/21 162 lb 14.4 oz (73.9 kg)  05/26/21 163 lb 3.2 oz (74 kg)  05/11/21 162 lb (73.5 kg)   Physical Exam Vitals reviewed.  Constitutional:      Appearance: Normal appearance.     Comments: In wheelchair  Cardiovascular:     Rate and Rhythm: Normal rate and regular rhythm.     Pulses: Normal pulses.     Heart sounds: Normal heart sounds.  Pulmonary:     Effort: Pulmonary effort is normal.     Breath sounds: Normal breath sounds.  Lymphadenopathy:     Cervical: No cervical adenopathy.     Right cervical: No superficial cervical adenopathy.    Left cervical: No superficial cervical adenopathy.     Upper Body:     Right upper body: No supraclavicular, axillary or pectoral adenopathy.     Left upper body: No supraclavicular, axillary or pectoral adenopathy.  Neurological:     General: No focal deficit present.     Mental Status: She is  alert and oriented to person, place, and time.  Psychiatric:        Mood and Affect: Mood normal.        Behavior: Behavior normal.    LABORATORY DATA:  I have reviewed the labs as listed.  CBC Latest Ref Rng & Units 05/26/2021 05/11/2021 02/06/2021  WBC 4.0 - 10.5 K/uL 35.4(H) 35.6(H) 15.5(H)  Hemoglobin 12.0 - 15.0 g/dL 9.2(L) 9.2(L) 8.7(L)  Hematocrit 36.0 - 46.0 % 28.2(L) 28.7(L) 26.4(L)  Platelets 150 - 400 K/uL 174 179 133(L)   CMP Latest Ref Rng & Units 06/29/2021 05/26/2021 05/11/2021  Glucose 70 - 99  mg/dL 129(H) 80 90  BUN 8 - 23 mg/dL _0 Creatinine 0.44 - 1.00 mg/dL 0.99 0.93 1.11(H)  Sodium 135 - 145 mmol/L 131(L) 132(L) 132(L)  Potassium 3.5 - 5.1 mmol/L 4.3 4.1 4.1  Chloride 98 - 111 mmol/L 95(L) 95(L) 97(L)  CO2 22 - 32 mmol/L _1 Calcium 8.9 - 10.3 mg/dL 8.4(L) 9.1 8.6(L)  Total Protein 6.5 - 8.1 g/dL 6.3(L) 6.7 6.4(L)  Total Bilirubin 0.3 - 1.2 mg/dL 0.4 0.7 0.5  Alkaline Phos 38 - 126 U/L 49 52 47  AST 15 - 41 U/L _2 ALT 0 - 44 U/L _3 Component Value Date/Time   RBC 3.19 (L) 05/26/2021 1250   MCV 88.4 05/26/2021 1250   MCH 28.8 05/26/2021 1250   MCHC 32.6 05/26/2021 1250   RDW 14.1 05/26/2021 1250   LYMPHSABS 29.0 (H) 05/26/2021 1250   MONOABS 2.4 (H) 05/26/2021 1250   EOSABS 0.1 05/26/2021 1250   BASOSABS 0.0 05/26/2021 1250    DIAGNOSTIC IMAGING:  I have independently reviewed the scans and discussed with the patient. No results found.   ASSESSMENT:  B-cell lymphoproliferative disorder: - Flow cytometry on 05/10/2021 with monoclonal population of B cells with expression of CD5 with no significant CD20 expression.  Small to medium sized lymphocytes with coarse chromatin and frequent prominent nucleoli.  Differential includes CLL versus mantle cell lymphoma. - FISH panel on 05/26/2021 shows 49% nuclei positive for 3 signals of CCND1 and 58% of nuclei positive for TP53 gene deletion. 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   Social/family history: - She lives by herself at home.  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  She is accompanied by her daughter today.  He was non-smoker. - 2 of her sisters had multiple myeloma.   PLAN:  B-cell lymphoproliferative disorder: - White count today is 41,000, predominantly lymphocytes. - The differential includes mantle cell lymphoma.  She does not have any B symptoms at this time. - However her total white count has been steadily going up since April 2022. - She does not have any palpable adenopathy.  We will reevaluate her in 2 months.  Will consider imaging if there is any worsening of CBC or development of B symptoms.  Normocytic anemia: - Received Venofer on April 2022, 06/05/2021 and 06/19/2021, total dose 1 g. - Her ferritin was 247 and percent saturation 14.  Hemoglobin is 9.9.  Anemia showed mild improvement after iron infusion. - We will plan to repeat anemia labs in 2 months.  Orders placed this encounter:  No orders of the defined types were placed in this encounter.  Total time spent is 30 minutes with more than 50% of the time spent face-to-face discussing differential diagnosis, management, counseling and coordination of care.  Derek Jack, MD [place] [phone]   I, Thana Ates, am acting as a scribe for Dr. Derek Jack.  I, Derek Jack MD, have reviewed the above documentation for accuracy and completeness, and I agree with the above.

## 2021-06-29 ENCOUNTER — Other Ambulatory Visit: Payer: Self-pay

## 2021-06-29 ENCOUNTER — Inpatient Hospital Stay (HOSPITAL_COMMUNITY): Payer: Medicare Other

## 2021-06-29 ENCOUNTER — Inpatient Hospital Stay (HOSPITAL_BASED_OUTPATIENT_CLINIC_OR_DEPARTMENT_OTHER): Payer: Medicare Other | Admitting: Hematology

## 2021-06-29 VITALS — BP 130/58 | HR 74 | Temp 97.0°F | Resp 18 | Wt 162.9 lb

## 2021-06-29 DIAGNOSIS — D500 Iron deficiency anemia secondary to blood loss (chronic): Secondary | ICD-10-CM | POA: Diagnosis not present

## 2021-06-29 DIAGNOSIS — K589 Irritable bowel syndrome without diarrhea: Secondary | ICD-10-CM | POA: Diagnosis not present

## 2021-06-29 DIAGNOSIS — D649 Anemia, unspecified: Secondary | ICD-10-CM

## 2021-06-29 DIAGNOSIS — Z79899 Other long term (current) drug therapy: Secondary | ICD-10-CM | POA: Diagnosis not present

## 2021-06-29 DIAGNOSIS — C9110 Chronic lymphocytic leukemia of B-cell type not having achieved remission: Secondary | ICD-10-CM | POA: Diagnosis not present

## 2021-06-29 DIAGNOSIS — M1990 Unspecified osteoarthritis, unspecified site: Secondary | ICD-10-CM | POA: Diagnosis not present

## 2021-06-29 DIAGNOSIS — D72820 Lymphocytosis (symptomatic): Secondary | ICD-10-CM

## 2021-06-29 DIAGNOSIS — Z806 Family history of leukemia: Secondary | ICD-10-CM | POA: Diagnosis not present

## 2021-06-29 DIAGNOSIS — I10 Essential (primary) hypertension: Secondary | ICD-10-CM | POA: Diagnosis not present

## 2021-06-29 LAB — CBC WITH DIFFERENTIAL/PLATELET
Basophils Absolute: 0 10*3/uL (ref 0.0–0.1)
Basophils Relative: 0 %
Eosinophils Absolute: 0.4 10*3/uL (ref 0.0–0.5)
Eosinophils Relative: 1 %
HCT: 29.7 % — ABNORMAL LOW (ref 36.0–46.0)
Hemoglobin: 9.9 g/dL — ABNORMAL LOW (ref 12.0–15.0)
Lymphocytes Relative: 90 %
Lymphs Abs: 37.2 10*3/uL — ABNORMAL HIGH (ref 0.7–4.0)
MCH: 30.2 pg (ref 26.0–34.0)
MCHC: 33.3 g/dL (ref 30.0–36.0)
MCV: 90.5 fL (ref 80.0–100.0)
Monocytes Absolute: 0 10*3/uL — ABNORMAL LOW (ref 0.1–1.0)
Monocytes Relative: 0 %
Neutro Abs: 3.7 10*3/uL (ref 1.7–7.7)
Neutrophils Relative %: 9 %
Platelets: 184 10*3/uL (ref 150–400)
RBC: 3.28 MIL/uL — ABNORMAL LOW (ref 3.87–5.11)
RDW: 15.9 % — ABNORMAL HIGH (ref 11.5–15.5)
WBC: 41.3 10*3/uL — ABNORMAL HIGH (ref 4.0–10.5)
nRBC: 0 % (ref 0.0–0.2)

## 2021-06-29 LAB — IRON AND TIBC
Iron: 45 ug/dL (ref 28–170)
Saturation Ratios: 14 % (ref 10.4–31.8)
TIBC: 321 ug/dL (ref 250–450)
UIBC: 276 ug/dL

## 2021-06-29 LAB — COMPREHENSIVE METABOLIC PANEL
ALT: 14 U/L (ref 0–44)
AST: 25 U/L (ref 15–41)
Albumin: 4 g/dL (ref 3.5–5.0)
Alkaline Phosphatase: 49 U/L (ref 38–126)
Anion gap: 8 (ref 5–15)
BUN: 21 mg/dL (ref 8–23)
CO2: 28 mmol/L (ref 22–32)
Calcium: 8.4 mg/dL — ABNORMAL LOW (ref 8.9–10.3)
Chloride: 95 mmol/L — ABNORMAL LOW (ref 98–111)
Creatinine, Ser: 0.99 mg/dL (ref 0.44–1.00)
GFR, Estimated: 55 mL/min — ABNORMAL LOW (ref >60)
Glucose, Bld: 129 mg/dL — ABNORMAL HIGH (ref 70–99)
Potassium: 4.3 mmol/L (ref 3.5–5.1)
Sodium: 131 mmol/L — ABNORMAL LOW (ref 135–145)
Total Bilirubin: 0.4 mg/dL (ref 0.3–1.2)
Total Protein: 6.3 g/dL — ABNORMAL LOW (ref 6.5–8.1)

## 2021-06-29 LAB — FERRITIN: Ferritin: 247 ng/mL (ref 11–307)

## 2021-06-29 LAB — LACTATE DEHYDROGENASE: LDH: 175 U/L (ref 98–192)

## 2021-06-29 NOTE — Patient Instructions (Addendum)
[facility] at [facility] Discharge Instructions  You were seen today by Dr. Katragadda. He went over your recent results. Dr. Katragadda will see you back in 2 months for labs and follow up.   Thank you for choosing [facility] at [facility] to provide your oncology and hematology care.  To afford each patient quality time with our provider, please arrive at least 15 minutes before your scheduled appointment time.   If you have a lab appointment with the Cancer Center please come in thru the Main Entrance and check in at the main information desk  You need to re-schedule your appointment should you arrive 10 or more minutes late.  We strive to give you quality time with our providers, and arriving late affects you and other patients whose appointments are after yours.  Also, if you no show three or more times for appointments you may be dismissed from the clinic at the providers discretion.     Again, thank you for choosing [facility].  Our hope is that these requests will decrease the amount of time that you wait before being seen by our physicians.       _____________________________________________________________  Should you have questions after your visit to [facility], please contact our office at [phone] between the hours of 8:00 a.m. and 4:30 p.m.  Voicemails left after 4:00 p.m. will not be returned until the following business day.  For prescription refill requests, have your pharmacy contact our office and allow 72 hours.    Cancer Center Support Programs:   > Cancer Support Group  2nd Tuesday of the month 1pm-2pm, Journey Room   

## 2021-07-14 DIAGNOSIS — M79671 Pain in right foot: Secondary | ICD-10-CM | POA: Diagnosis not present

## 2021-07-14 DIAGNOSIS — M79672 Pain in left foot: Secondary | ICD-10-CM | POA: Diagnosis not present

## 2021-07-14 DIAGNOSIS — L600 Ingrowing nail: Secondary | ICD-10-CM | POA: Diagnosis not present

## 2021-07-14 DIAGNOSIS — I739 Peripheral vascular disease, unspecified: Secondary | ICD-10-CM | POA: Diagnosis not present

## 2021-08-03 DIAGNOSIS — R609 Edema, unspecified: Secondary | ICD-10-CM | POA: Diagnosis not present

## 2021-08-03 DIAGNOSIS — I10 Essential (primary) hypertension: Secondary | ICD-10-CM | POA: Diagnosis not present

## 2021-08-03 DIAGNOSIS — D649 Anemia, unspecified: Secondary | ICD-10-CM | POA: Diagnosis not present

## 2021-08-03 DIAGNOSIS — Z23 Encounter for immunization: Secondary | ICD-10-CM | POA: Diagnosis not present

## 2021-08-17 ENCOUNTER — Encounter (HOSPITAL_COMMUNITY): Payer: Self-pay | Admitting: Emergency Medicine

## 2021-08-17 ENCOUNTER — Emergency Department (HOSPITAL_COMMUNITY)
Admission: EM | Admit: 2021-08-17 | Discharge: 2021-08-17 | Disposition: A | Discharge status: Home / Self Care | Payer: Medicare Other | Attending: Emergency Medicine | Admitting: Emergency Medicine

## 2021-08-17 ENCOUNTER — Other Ambulatory Visit: Payer: Self-pay

## 2021-08-17 DIAGNOSIS — Z5321 Procedure and treatment not carried out due to patient leaving prior to being seen by health care provider: Secondary | ICD-10-CM | POA: Insufficient documentation

## 2021-08-17 DIAGNOSIS — R197 Diarrhea, unspecified: Secondary | ICD-10-CM | POA: Insufficient documentation

## 2021-08-17 NOTE — ED Triage Notes (Signed)
Pt to the ED with complaints of diarrhea after having 3 bowels movements this morning.

## 2021-08-17 NOTE — ED Provider Notes (Signed)
MSE attempted.  Pt left before evaluation   Faith Lee 08/17/21 1057    Long, Wonda Olds, MD 08/23/21 1413

## 2021-08-27 LAB — FLOW CYTOMETRY

## 2021-09-02 ENCOUNTER — Ambulatory Visit (HOSPITAL_COMMUNITY): Payer: Medicare Other | Admitting: Hematology

## 2021-09-02 ENCOUNTER — Other Ambulatory Visit (HOSPITAL_COMMUNITY): Payer: Medicare Other

## 2021-09-20 NOTE — Progress Notes (Signed)
[place], [place] [address]   CLINIC:  Medical Oncology/Hematology  PCP:  Asencion Noble, MD [address] / [place] [address]  [phone]  REASON FOR VISIT:  Follow-up for CLL, lymphocytosis, and normocytic anemia   PRIOR THERAPY: none  CURRENT THERAPY: Intermittent IV Iron (Venofer)  INTERVAL HISTORY:  [name], a 87 y.o. female, returns for routine follow-up for her CLL, lymphocytosis, and normocytic anemia . Faith Lee was last seen on 06/29/2021.  Today she reports feeling good. She denies fevers and night sweats, and her weight is stable. Her energy and activity levels have improved, and she is now able to walk in her house with assistance. She lives at home alone and is able to do all of her typical activities without assistance. She reports constipation. She denies any recent steroid use.   REVIEW OF SYSTEMS:  Review of Systems  Constitutional:  Negative for appetite change (75%), fatigue (75%) and unexpected weight change.  HENT:   Positive for trouble swallowing (chewing).   Gastrointestinal:  Positive for constipation.  All other systems reviewed and are negative.  PAST MEDICAL/SURGICAL HISTORY:  Past Medical History:  Diagnosis Date   Anxiety    Hypertension    Irritable bowel disease    Osteoarthritis    Past Surgical History:  Procedure Laterality Date   ABDOMINAL HYSTERECTOMY  1974   CARDIAC CATHETERIZATION     1991 and 2005    SOCIAL HISTORY:  Social History   Socioeconomic History   Marital status: Widowed    Spouse name: Not on file   Number of children: Not on file   Years of education: Not on file   Highest education level: Not on file  Occupational History   Not on file  Tobacco Use   Smoking status: Never   Smokeless tobacco: Never  Vaping Use   Vaping Use: Never used  Substance and Sexual Activity   Alcohol use: No   Drug use: No   Sexual activity: Not on file   Other Topics Concern   Not on file  Social History Narrative   Not on file   Social Determinants of Health   Financial Resource Strain: Low Risk    Difficulty of Paying Living Expenses: Not very hard  Food Insecurity: No Food Insecurity   Worried About Running Out of Food in the Last Year: Never true   Ran Out of Food in the Last Year: Never true  Transportation Needs: No Transportation Needs   Lack of Transportation (Medical): No   Lack of Transportation (Non-Medical): No  Physical Activity: Insufficiently Active   Days of Exercise per Week: 2 days   Minutes of Exercise per Session: 10 min  Stress: Stress Concern Present   Feeling of Stress : To some extent  Social Connections: Moderately Integrated   Frequency of Communication with Friends and Family: More than three times a week   Frequency of Social Gatherings with Friends and Family: Twice a week   Attends Religious Services: More than 4 times per year   Active Member of [employer] or Organizations: Yes   Attends Archivist Meetings: 1 to 4 times per year   Marital Status: Widowed  Human resources officer Violence: Not At Risk   Fear of Current or Ex-Partner: No   Emotionally Abused: No   Physically Abused: No   Sexually Abused: No    FAMILY HISTORY:  Family History  Problem Relation Age of Onset  Renal Disease Sister    Multiple myeloma Sister     CURRENT MEDICATIONS:  Current Outpatient Medications  Medication Sig Dispense Refill   acetaminophen (TYLENOL) 500 MG tablet Take 1,000 mg by mouth every 6 (six) hours as needed for moderate pain.     furosemide (LASIX) 20 MG tablet Take 20 mg by mouth daily.     LORazepam (ATIVAN) 0.5 MG tablet Take 0.5 mg by mouth 2 (two) times daily.     metoprolol tartrate (LOPRESSOR) 25 MG tablet Take 25 mg by mouth 2 (two) times daily.     MODERNA COVID-19 VACCINE 100 MCG/0.5ML injection      olmesartan-hydrochlorothiazide (BENICAR HCT) 40-25 MG tablet Take 1 tablet by mouth  daily.     potassium chloride (MICRO-K) 10 MEQ CR capsule Take 10 mEq by mouth daily.     No current facility-administered medications for this visit.    ALLERGIES:  Allergies  Allergen Reactions   Lidocaine Other (See Comments) and Hypertension    Patient had chest tightness with stomach pain, numbness on left side of limbs and unable to speak    PHYSICAL EXAM:  Performance status (ECOG): 1 - Symptomatic but completely ambulatory  Vitals:   09/21/21 1119  BP: (!) 111/55  Pulse: 70  Resp: 18  Temp: 98.2 F (36.8 C)  SpO2: 100%   Wt Readings from Last 3 Encounters:  09/21/21 162 lb 11.2 oz (73.8 kg)  08/17/21 158 lb (71.7 kg)  06/29/21 162 lb 14.4 oz (73.9 kg)   Physical Exam Vitals reviewed.  Constitutional:      Appearance: Normal appearance.     Comments: In wheelchair  Cardiovascular:     Rate and Rhythm: Normal rate and regular rhythm.     Pulses: Normal pulses.     Heart sounds: Normal heart sounds.  Pulmonary:     Effort: Pulmonary effort is normal.     Breath sounds: Normal breath sounds.  Abdominal:     Palpations: Abdomen is soft. There is no hepatomegaly, splenomegaly or mass.     Tenderness: There is no abdominal tenderness.  Lymphadenopathy:     Cervical: Cervical adenopathy present.     Right cervical: No superficial, deep or posterior cervical adenopathy.    Left cervical: Posterior cervical adenopathy present. No superficial or deep cervical adenopathy.     Upper Body:     Right upper body: No supraclavicular, axillary or pectoral adenopathy.     Left upper body: No supraclavicular, axillary or pectoral adenopathy.  Neurological:     General: No focal deficit present.     Mental Status: She is alert and oriented to person, place, and time.  Psychiatric:        Mood and Affect: Mood normal.        Behavior: Behavior normal.    LABORATORY DATA:  I have reviewed the labs as listed.  CBC Latest Ref Rng & Units 09/21/2021 06/29/2021 05/26/2021  WBC  4.0 - 10.5 K/uL 67.4(HH) 41.3(H) 35.4(H)  Hemoglobin 12.0 - 15.0 g/dL 9.5(L) 9.9(L) 9.2(L)  Hematocrit 36.0 - 46.0 % 28.5(L) 29.7(L) 28.2(L)  Platelets 150 - 400 K/uL 160 184 174   CMP Latest Ref Rng & Units 06/29/2021 05/26/2021 05/11/2021  Glucose 70 - 99 mg/dL 129(H) 80 90  BUN 8 - 23 mg/dL _0 Creatinine 0.44 - 1.00 mg/dL 0.99 0.93 1.11(H)  Sodium 135 - 145 mmol/L 131(L) 132(L) 132(L)  Potassium 3.5 - 5.1 mmol/L 4.3 4.1 4.1  Chloride 98 -  111 mmol/L 95(L) 95(L) 97(L)  CO2 22 - 32 mmol/L _0 Calcium 8.9 - 10.3 mg/dL 8.4(L) 9.1 8.6(L)  Total Protein 6.5 - 8.1 g/dL 6.3(L) 6.7 6.4(L)  Total Bilirubin 0.3 - 1.2 mg/dL 0.4 0.7 0.5  Alkaline Phos 38 - 126 U/L 49 52 47  AST 15 - 41 U/L _1 ALT 0 - 44 U/L _2 Component Value Date/Time   RBC 2.92 (L) 09/21/2021 1040   MCV 97.6 09/21/2021 1040   MCH 32.5 09/21/2021 1040   MCHC 33.3 09/21/2021 1040   RDW 14.4 09/21/2021 1040   LYMPHSABS 60.0 (H) 09/21/2021 1040   MONOABS 1.3 (H) 09/21/2021 1040   EOSABS 0.0 09/21/2021 1040   BASOSABS 0.0 09/21/2021 1040    DIAGNOSTIC IMAGING:  I have independently reviewed the scans and discussed with the patient. No results found.   ASSESSMENT:  B-cell lymphoproliferative disorder: - Flow cytometry on 05/10/2021 with monoclonal population of B cells with expression of CD5 with no significant CD20 expression.  Small to medium sized lymphocytes with coarse chromatin and frequent prominent nucleoli.  Differential includes atypical CLL versus mantle cell lymphoma. - FISH panel on 05/26/2021 shows 49% nuclei positive for 3 signals of CCND1 and 58% of nuclei positive for TP53 gene deletion. 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    Social/family history: - She lives by herself at home.  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  She is accompanied by her daughter today.  He was non-smoker. - 2 of her sisters had multiple myeloma.   PLAN:  B-cell lymphoproliferative  disorder: - She has elevated white count, predominantly lymphocytes. - Differential includes mantle cell lymphoma. - She does not have any fevers, night sweats or weight loss at this time. - She reports improvement in energy levels by 80%.  She is continuing to live independently and does all her ADLs and requires assistance and IADLs. - Reviewed labs today which showed white count increased to 67 from 41 previously.  Differential shows 89% lymphocytes.  Platelet count is normal.  Hemoglobin 9.5. - RTC 2 months for follow-up with repeat labs.   Normocytic anemia: - Last Venofer on 06/19/2021. - Ferritin is 141 and percent saturation 17.  Ferritin is down from 247 in September.  Hemoglobin is 9.5. - We will plan to recheck ferritin and iron panel next visit to see if she needs repeat parenteral iron therapy.  Orders placed this encounter:  No orders of the defined types were placed in this encounter.    Derek Jack, MD [place] [phone]   I, Thana Ates, am acting as a scribe for Dr. Derek Jack.  I, Derek Jack MD, have reviewed the above documentation for accuracy and completeness, and I agree with the above.

## 2021-09-21 ENCOUNTER — Inpatient Hospital Stay (HOSPITAL_COMMUNITY): Payer: Medicare Other | Attending: Hematology

## 2021-09-21 ENCOUNTER — Other Ambulatory Visit: Payer: Self-pay

## 2021-09-21 ENCOUNTER — Inpatient Hospital Stay (HOSPITAL_BASED_OUTPATIENT_CLINIC_OR_DEPARTMENT_OTHER): Payer: Medicare Other | Admitting: Hematology

## 2021-09-21 VITALS — BP 111/55 | HR 70 | Temp 98.2°F | Resp 18 | Ht 60.0 in | Wt 162.7 lb

## 2021-09-21 DIAGNOSIS — K5900 Constipation, unspecified: Secondary | ICD-10-CM | POA: Insufficient documentation

## 2021-09-21 DIAGNOSIS — D500 Iron deficiency anemia secondary to blood loss (chronic): Secondary | ICD-10-CM

## 2021-09-21 DIAGNOSIS — D649 Anemia, unspecified: Secondary | ICD-10-CM

## 2021-09-21 DIAGNOSIS — D72820 Lymphocytosis (symptomatic): Secondary | ICD-10-CM | POA: Insufficient documentation

## 2021-09-21 DIAGNOSIS — Z808 Family history of malignant neoplasm of other organs or systems: Secondary | ICD-10-CM | POA: Insufficient documentation

## 2021-09-21 DIAGNOSIS — R5383 Other fatigue: Secondary | ICD-10-CM | POA: Insufficient documentation

## 2021-09-21 DIAGNOSIS — Z79899 Other long term (current) drug therapy: Secondary | ICD-10-CM | POA: Diagnosis not present

## 2021-09-21 DIAGNOSIS — C9110 Chronic lymphocytic leukemia of B-cell type not having achieved remission: Secondary | ICD-10-CM | POA: Insufficient documentation

## 2021-09-21 DIAGNOSIS — Z841 Family history of disorders of kidney and ureter: Secondary | ICD-10-CM | POA: Diagnosis not present

## 2021-09-21 LAB — IRON AND TIBC
Iron: 55 ug/dL (ref 28–170)
Saturation Ratios: 17 % (ref 10.4–31.8)
TIBC: 326 ug/dL (ref 250–450)
UIBC: 271 ug/dL

## 2021-09-21 LAB — CBC WITH DIFFERENTIAL/PLATELET
Basophils Absolute: 0 10*3/uL (ref 0.0–0.1)
Basophils Relative: 0 %
Eosinophils Absolute: 0 10*3/uL (ref 0.0–0.5)
Eosinophils Relative: 0 %
HCT: 28.5 % — ABNORMAL LOW (ref 36.0–46.0)
Hemoglobin: 9.5 g/dL — ABNORMAL LOW (ref 12.0–15.0)
Lymphocytes Relative: 89 %
Lymphs Abs: 60 10*3/uL — ABNORMAL HIGH (ref 0.7–4.0)
MCH: 32.5 pg (ref 26.0–34.0)
MCHC: 33.3 g/dL (ref 30.0–36.0)
MCV: 97.6 fL (ref 80.0–100.0)
Monocytes Absolute: 1.3 10*3/uL — ABNORMAL HIGH (ref 0.1–1.0)
Monocytes Relative: 2 %
Neutro Abs: 6.1 10*3/uL (ref 1.7–7.7)
Neutrophils Relative %: 9 %
Platelets: 160 10*3/uL (ref 150–400)
RBC: 2.92 MIL/uL — ABNORMAL LOW (ref 3.87–5.11)
RDW: 14.4 % (ref 11.5–15.5)
WBC: 67.4 10*3/uL (ref 4.0–10.5)
nRBC: 0 % (ref 0.0–0.2)

## 2021-09-21 LAB — LACTATE DEHYDROGENASE: LDH: 214 U/L — ABNORMAL HIGH (ref 98–192)

## 2021-09-21 LAB — FOLATE: Folate: 11.5 ng/mL (ref >5.9)

## 2021-09-21 LAB — VITAMIN B12: Vitamin B-12: 511 pg/mL (ref 180–914)

## 2021-09-21 LAB — FERRITIN: Ferritin: 141 ng/mL (ref 11–307)

## 2021-09-21 NOTE — Patient Instructions (Signed)
[facility] at [facility] Discharge Instructions  You were seen and examined today by Dr. Katragadda. Please keep follow up as scheduled.   Thank you for choosing [facility] at [facility] to provide your oncology and hematology care.  To afford each patient quality time with our provider, please arrive at least 15 minutes before your scheduled appointment time.   If you have a lab appointment with the Cancer Center please come in thru the Main Entrance and check in at the main information desk.  You need to re-schedule your appointment should you arrive 10 or more minutes late.  We strive to give you quality time with our providers, and arriving late affects you and other patients whose appointments are after yours.  Also, if you no show three or more times for appointments you may be dismissed from the clinic at the providers discretion.     Again, thank you for choosing [facility].  Our hope is that these requests will decrease the amount of time that you wait before being seen by our physicians.       _____________________________________________________________  Should you have questions after your visit to [facility], please contact our office at [phone] and follow the prompts.  Our office hours are 8:00 a.m. and 4:30 p.m. Monday - Friday.  Please note that voicemails left after 4:00 p.m. may not be returned until the following business day.  We are closed weekends and major holidays.  You do have access to a nurse 24-7, just call the main number to the clinic [phone] and do not press any options, hold on the line and a nurse will answer the phone.    For prescription refill requests, have your pharmacy contact our office and allow 72 hours.    Due to Covid, you will need to wear a mask upon entering the hospital. If you do not have a mask, a mask will be given to you at the Main Entrance upon arrival. For  doctor visits, patients may have 1 support person age 18 or older with them. For treatment visits, patients can not have anyone with them due to social distancing guidelines and our immunocompromised population.     

## 2021-09-21 NOTE — Progress Notes (Unsigned)
CRITICAL VALUE ALERT Critical value received:  WBC 67.4 Date of notification:  09/21/2021 Time of notification: 11:21 am  Critical value read back:  Yes.   Nurse who received alert:  B Lucetta Baehr RN  MD notified time and response:  Dr. Delton Coombes. Patient has appointment today.

## 2021-10-06 DIAGNOSIS — L600 Ingrowing nail: Secondary | ICD-10-CM | POA: Diagnosis not present

## 2021-10-06 DIAGNOSIS — M79671 Pain in right foot: Secondary | ICD-10-CM | POA: Diagnosis not present

## 2021-10-06 DIAGNOSIS — I739 Peripheral vascular disease, unspecified: Secondary | ICD-10-CM | POA: Diagnosis not present

## 2021-10-06 DIAGNOSIS — M79672 Pain in left foot: Secondary | ICD-10-CM | POA: Diagnosis not present

## 2021-11-03 DIAGNOSIS — Z743 Need for continuous supervision: Secondary | ICD-10-CM | POA: Diagnosis not present

## 2021-11-03 DIAGNOSIS — I499 Cardiac arrhythmia, unspecified: Secondary | ICD-10-CM | POA: Diagnosis not present

## 2021-11-03 DIAGNOSIS — R52 Pain, unspecified: Secondary | ICD-10-CM | POA: Diagnosis not present

## 2021-11-03 DIAGNOSIS — R059 Cough, unspecified: Secondary | ICD-10-CM | POA: Diagnosis not present

## 2021-11-09 DIAGNOSIS — C9111 Chronic lymphocytic leukemia of B-cell type in remission: Secondary | ICD-10-CM | POA: Diagnosis not present

## 2021-11-09 DIAGNOSIS — I10 Essential (primary) hypertension: Secondary | ICD-10-CM | POA: Diagnosis not present

## 2021-11-20 DIAGNOSIS — R1111 Vomiting without nausea: Secondary | ICD-10-CM | POA: Diagnosis not present

## 2021-11-20 DIAGNOSIS — R059 Cough, unspecified: Secondary | ICD-10-CM | POA: Diagnosis not present

## 2021-11-30 ENCOUNTER — Ambulatory Visit (HOSPITAL_COMMUNITY): Payer: Medicare Other | Admitting: Hematology

## 2021-11-30 ENCOUNTER — Other Ambulatory Visit (HOSPITAL_COMMUNITY): Payer: Medicare Other

## 2021-12-15 DIAGNOSIS — R110 Nausea: Secondary | ICD-10-CM | POA: Diagnosis not present

## 2021-12-15 DIAGNOSIS — C9110 Chronic lymphocytic leukemia of B-cell type not having achieved remission: Secondary | ICD-10-CM | POA: Diagnosis not present

## 2021-12-15 DIAGNOSIS — R109 Unspecified abdominal pain: Secondary | ICD-10-CM | POA: Diagnosis not present

## 2021-12-15 DIAGNOSIS — C9190 Lymphoid leukemia, unspecified not having achieved remission: Secondary | ICD-10-CM | POA: Diagnosis not present

## 2021-12-15 DIAGNOSIS — Z79899 Other long term (current) drug therapy: Secondary | ICD-10-CM | POA: Diagnosis not present

## 2021-12-15 DIAGNOSIS — R1084 Generalized abdominal pain: Secondary | ICD-10-CM | POA: Diagnosis not present

## 2021-12-16 ENCOUNTER — Other Ambulatory Visit: Payer: Self-pay | Admitting: Internal Medicine

## 2021-12-16 ENCOUNTER — Other Ambulatory Visit (HOSPITAL_COMMUNITY): Payer: Self-pay | Admitting: Internal Medicine

## 2021-12-16 DIAGNOSIS — R109 Unspecified abdominal pain: Secondary | ICD-10-CM

## 2021-12-16 IMAGING — DX DG RIBS W/ CHEST 3+V BILAT
5 series · 5 of 5 positions shown · non-contrast
Comparison: None.

CLINICAL DATA: Bilateral rib and chest pain posteriorly, no known
injury

EXAM:
BILATERAL RIBS AND CHEST - 4+ VIEW

[chest pa]
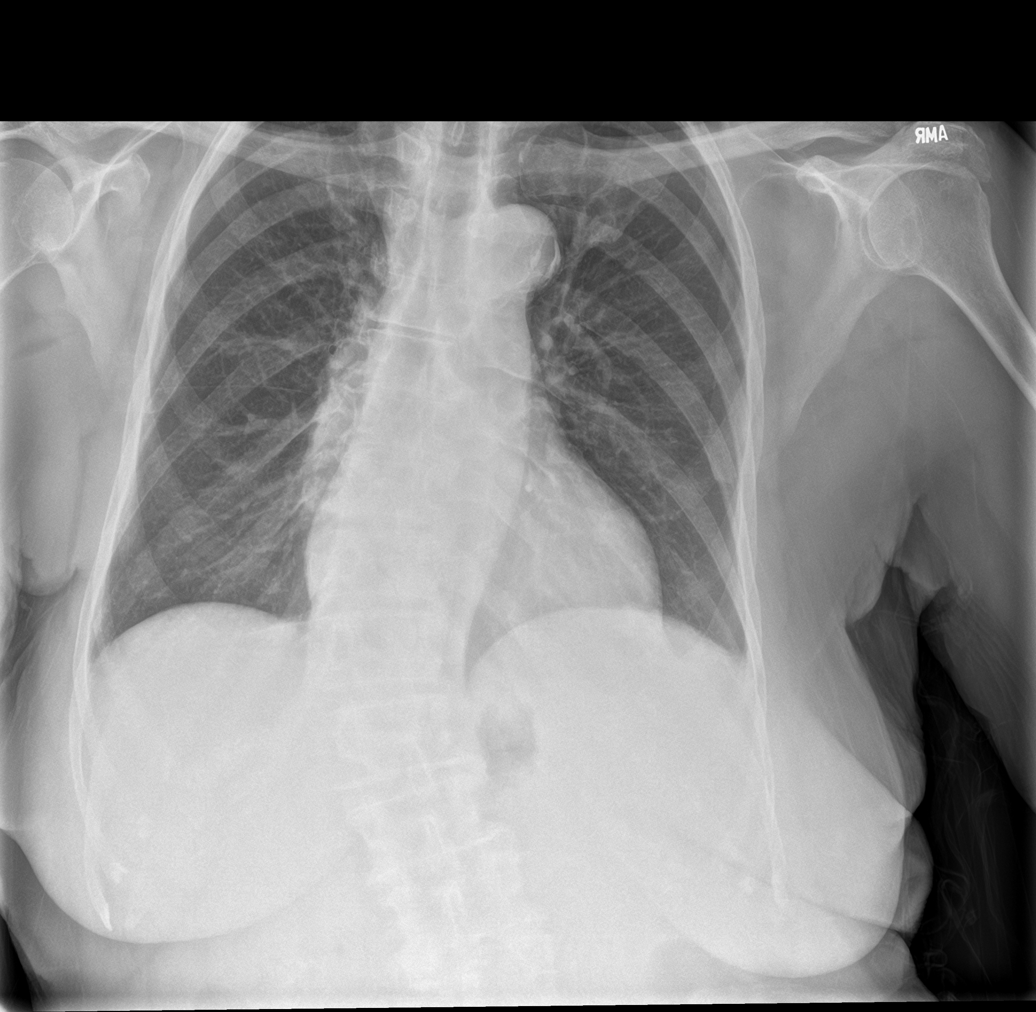

[rib pa obl (1 of 2)]
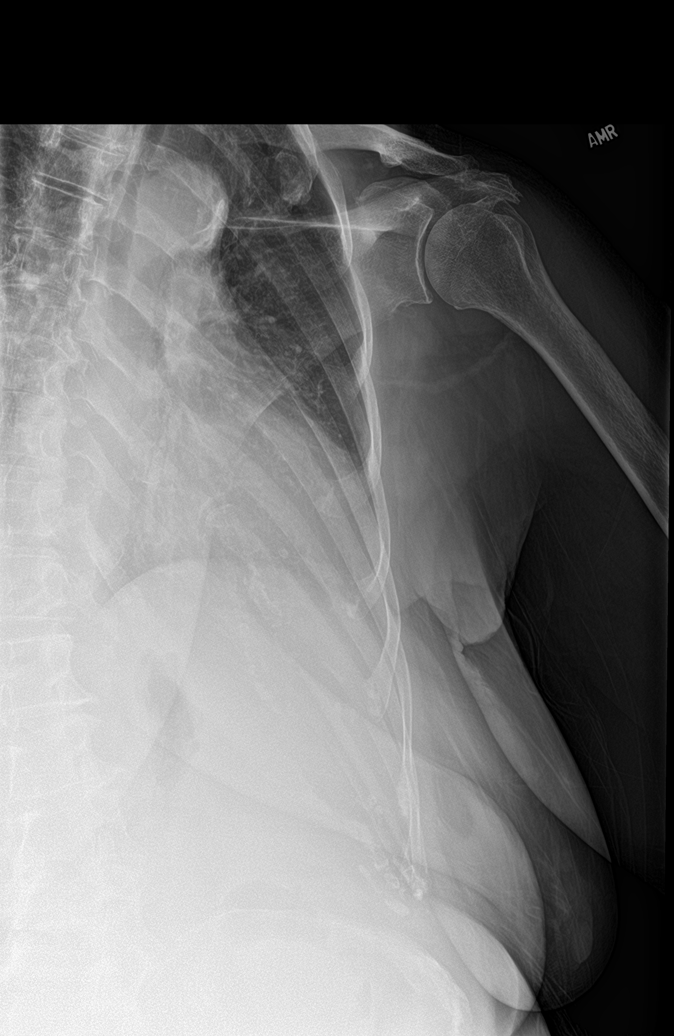

[rib pa obl (2 of 2)]
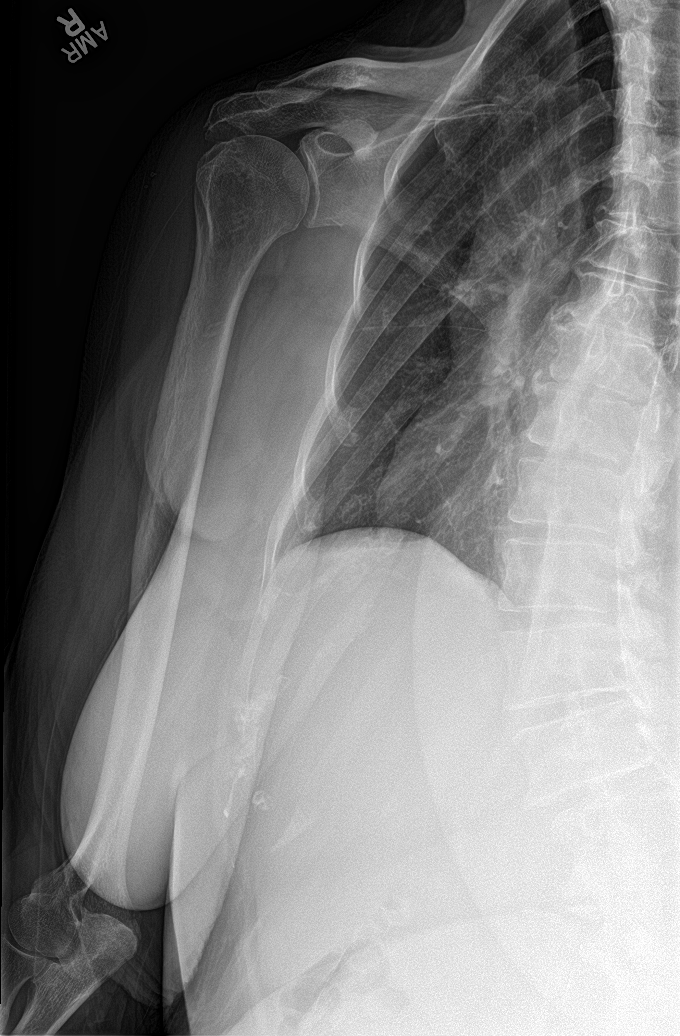

[rib ap (1 of 2)]
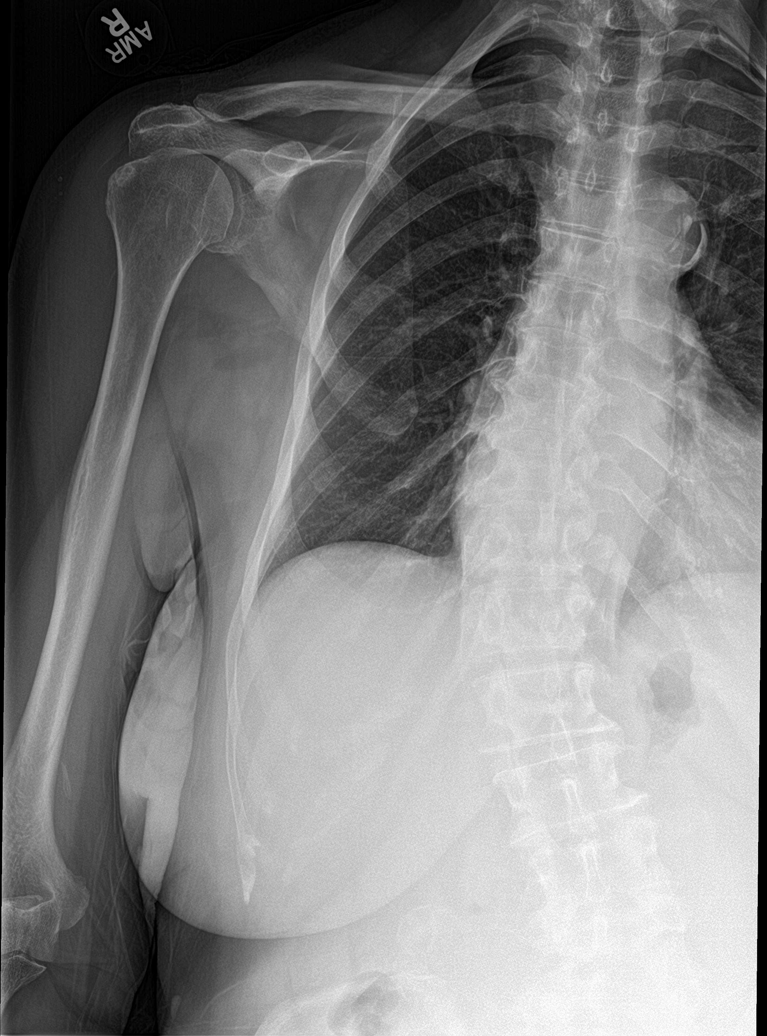

[rib ap (2 of 2)]
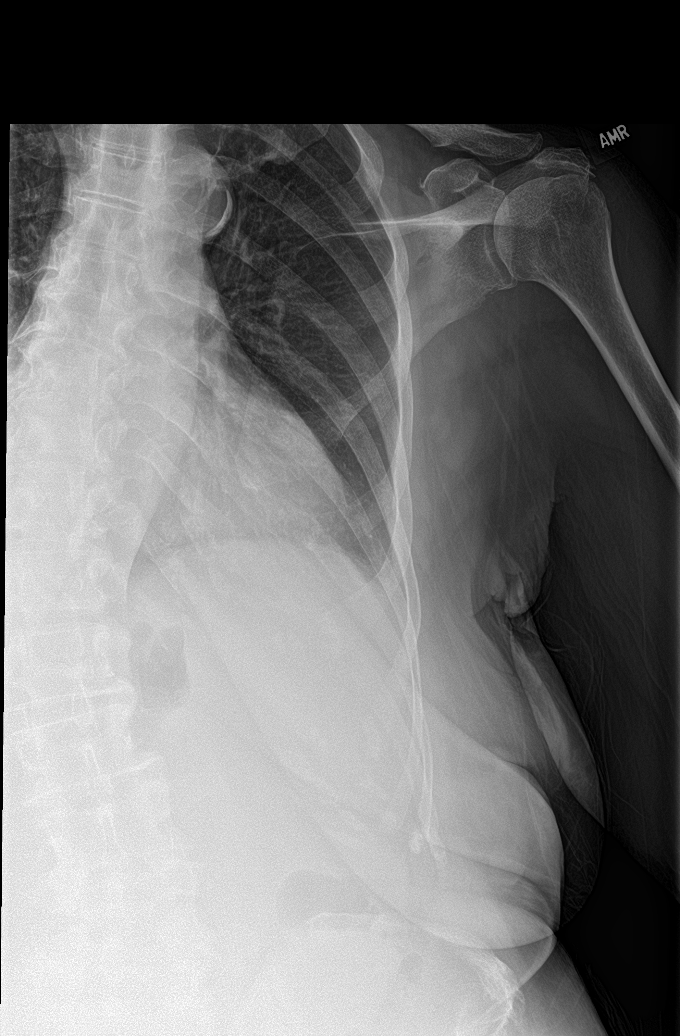

[5 of 5 positions shown; findings below may reference images not displayed]

FINDINGS: No fracture or other bone lesions are seen involving the ribs. There
is no evidence of pneumothorax or pleural effusion. Both lungs are
clear. Heart size and mediastinal contours are within normal limits.
Dextroscoliosis of the thoracolumbar spine.
IMPRESSION: 1. No acute abnormality of the lungs. No displaced rib fracture or
other radiographic findings to explain pain.

2.  Dextroscoliosis of the thoracolumbar spine.

## 2021-12-17 ENCOUNTER — Other Ambulatory Visit: Payer: Self-pay | Admitting: Internal Medicine

## 2021-12-17 ENCOUNTER — Encounter (HOSPITAL_COMMUNITY): Payer: Self-pay

## 2021-12-17 ENCOUNTER — Other Ambulatory Visit: Payer: Self-pay

## 2021-12-17 ENCOUNTER — Ambulatory Visit (HOSPITAL_COMMUNITY): Admission: RE | Admit: 2021-12-17 | Payer: Medicare Other | Source: Ambulatory Visit

## 2021-12-17 ENCOUNTER — Other Ambulatory Visit (HOSPITAL_COMMUNITY): Payer: Self-pay | Admitting: Internal Medicine

## 2021-12-17 DIAGNOSIS — R109 Unspecified abdominal pain: Secondary | ICD-10-CM

## 2021-12-28 DIAGNOSIS — I739 Peripheral vascular disease, unspecified: Secondary | ICD-10-CM | POA: Diagnosis not present

## 2021-12-28 DIAGNOSIS — L600 Ingrowing nail: Secondary | ICD-10-CM | POA: Diagnosis not present

## 2021-12-28 DIAGNOSIS — M79671 Pain in right foot: Secondary | ICD-10-CM | POA: Diagnosis not present

## 2021-12-28 DIAGNOSIS — M79672 Pain in left foot: Secondary | ICD-10-CM | POA: Diagnosis not present

## 2022-01-11 ENCOUNTER — Inpatient Hospital Stay (HOSPITAL_BASED_OUTPATIENT_CLINIC_OR_DEPARTMENT_OTHER): Payer: Medicare Other | Admitting: Hematology

## 2022-01-11 ENCOUNTER — Other Ambulatory Visit: Payer: Self-pay

## 2022-01-11 ENCOUNTER — Inpatient Hospital Stay (HOSPITAL_COMMUNITY): Payer: Medicare Other

## 2022-01-11 ENCOUNTER — Ambulatory Visit (HOSPITAL_COMMUNITY): Payer: Medicare Other | Admitting: Hematology

## 2022-01-11 ENCOUNTER — Inpatient Hospital Stay (HOSPITAL_COMMUNITY): Payer: Medicare Other | Attending: Hematology

## 2022-01-11 VITALS — BP 126/57 | HR 97 | Temp 98.0°F | Resp 18 | Ht 64.0 in | Wt 154.9 lb

## 2022-01-11 DIAGNOSIS — D500 Iron deficiency anemia secondary to blood loss (chronic): Secondary | ICD-10-CM

## 2022-01-11 DIAGNOSIS — D649 Anemia, unspecified: Secondary | ICD-10-CM

## 2022-01-11 DIAGNOSIS — I10 Essential (primary) hypertension: Secondary | ICD-10-CM | POA: Insufficient documentation

## 2022-01-11 DIAGNOSIS — D72820 Lymphocytosis (symptomatic): Secondary | ICD-10-CM | POA: Diagnosis not present

## 2022-01-11 DIAGNOSIS — Z807 Family history of other malignant neoplasms of lymphoid, hematopoietic and related tissues: Secondary | ICD-10-CM | POA: Insufficient documentation

## 2022-01-11 DIAGNOSIS — Z90710 Acquired absence of both cervix and uterus: Secondary | ICD-10-CM | POA: Diagnosis not present

## 2022-01-11 DIAGNOSIS — C9110 Chronic lymphocytic leukemia of B-cell type not having achieved remission: Secondary | ICD-10-CM | POA: Insufficient documentation

## 2022-01-11 LAB — CBC WITH DIFFERENTIAL/PLATELET
Basophils Absolute: 0 10*3/uL (ref 0.0–0.1)
Basophils Relative: 0 %
Eosinophils Absolute: 1.6 10*3/uL — ABNORMAL HIGH (ref 0.0–0.5)
Eosinophils Relative: 2 %
HCT: 27.2 % — ABNORMAL LOW (ref 36.0–46.0)
Hemoglobin: 8.6 g/dL — ABNORMAL LOW (ref 12.0–15.0)
Lymphocytes Relative: 91 %
Lymphs Abs: 72.5 10*3/uL — ABNORMAL HIGH (ref 0.7–4.0)
MCH: 31.5 pg (ref 26.0–34.0)
MCHC: 31.6 g/dL (ref 30.0–36.0)
MCV: 99.6 fL (ref 80.0–100.0)
Monocytes Absolute: 0 10*3/uL — ABNORMAL LOW (ref 0.1–1.0)
Monocytes Relative: 0 %
Neutro Abs: 5.6 10*3/uL (ref 1.7–7.7)
Neutrophils Relative %: 7 %
Platelets: 153 10*3/uL (ref 150–400)
RBC: 2.73 MIL/uL — ABNORMAL LOW (ref 3.87–5.11)
RDW: 15.1 % (ref 11.5–15.5)
WBC: 79.7 10*3/uL (ref 4.0–10.5)
nRBC: 0 % (ref 0.0–0.2)

## 2022-01-11 LAB — LACTATE DEHYDROGENASE: LDH: 245 U/L — ABNORMAL HIGH (ref 98–192)

## 2022-01-11 LAB — COMPREHENSIVE METABOLIC PANEL
ALT: 13 U/L (ref 0–44)
AST: 27 U/L (ref 15–41)
Albumin: 3.9 g/dL (ref 3.5–5.0)
Alkaline Phosphatase: 53 U/L (ref 38–126)
Anion gap: 8 (ref 5–15)
BUN: 36 mg/dL — ABNORMAL HIGH (ref 8–23)
CO2: 26 mmol/L (ref 22–32)
Calcium: 8.5 mg/dL — ABNORMAL LOW (ref 8.9–10.3)
Chloride: 104 mmol/L (ref 98–111)
Creatinine, Ser: 1.33 mg/dL — ABNORMAL HIGH (ref 0.44–1.00)
GFR, Estimated: 38 mL/min — ABNORMAL LOW (ref >60)
Glucose, Bld: 142 mg/dL — ABNORMAL HIGH (ref 70–99)
Potassium: 4.2 mmol/L (ref 3.5–5.1)
Sodium: 138 mmol/L (ref 135–145)
Total Bilirubin: 0.5 mg/dL (ref 0.3–1.2)
Total Protein: 6.3 g/dL — ABNORMAL LOW (ref 6.5–8.1)

## 2022-01-11 LAB — IRON AND TIBC
Iron: 41 ug/dL (ref 28–170)
Saturation Ratios: 13 % (ref 10.4–31.8)
TIBC: 325 ug/dL (ref 250–450)
UIBC: 284 ug/dL

## 2022-01-11 LAB — FERRITIN: Ferritin: 132 ng/mL (ref 11–307)

## 2022-01-11 LAB — URIC ACID: Uric Acid, Serum: 9 mg/dL — ABNORMAL HIGH (ref 2.5–7.1)

## 2022-01-11 MED ORDER — ALLOPURINOL 300 MG PO TABS: 300.0000 mg | ORAL_TABLET | Freq: Every day | ORAL | 6 refills | Status: DC

## 2022-01-11 NOTE — Progress Notes (Unsigned)
CRITICAL VALUE ALERT ?Critical value received:  WBC 79.7 ?Date of notification:  01-11-2022 ?Time of notification: 09:57 am ?Critical value read back:  Yes.   ?Nurse who received alert:  B. Guadalupe Kerekes RN ?MD notified time and response:  Dr. Delton Coombes / A. Beckie Salts.   ?

## 2022-01-11 NOTE — Progress Notes (Signed)
? ?[facility] ?[address] ?[place], [place] [address] ? ? ?CLINIC:  ?Medical Oncology/Hematology ? ?PCP:  ?Faith Noble, MD ?[address] / [place] [address] ?[phone] ? ? ?REASON FOR VISIT:  ?Follow-up for CLL, lymphocytosis, and normocytic anemia  ? ?PRIOR THERAPY: none ? ?CURRENT THERAPY: Intermittent IV Iron (Venofer) ? ?BRIEF ONCOLOGIC HISTORY:  ?Oncology History  ? No history exists.  ? ? ?CANCER STAGING: ? Cancer Staging  ?No matching staging information was found for the patient. ? ?INTERVAL HISTORY:  ?[name], a 88 y.o. female, returns for routine follow-up of her CLL, lymphocytosis, and normocytic anemia . [name] was last seen on 09/21/2021.  ? ?Today she reports feeling fair, and she is accompanied by her daughter. She denies increased fatigue and night sweats. She reports constipation due to IBS. She lost 8 lbs since her last visit. Since her last visit she had a influenza infection. She denies current bleeding and black stools. Her appetite is good, but she gets full easily when eating. She takes 1 Lasix daily.  ? ?REVIEW OF SYSTEMS:  ?Review of Systems  ?Constitutional:  Positive for unexpected weight change (-8 lbs). Negative for appetite change and fatigue.  ?HENT:   Negative for nosebleeds.   ?Respiratory:  Negative for hemoptysis.   ?Cardiovascular:  Positive for leg swelling.  ?Gastrointestinal:  Positive for constipation (IBS). Negative for blood in stool.  ?Endocrine: Negative for hot flashes.  ?Genitourinary:  Negative for hematuria.   ?Psychiatric/Behavioral:  Positive for sleep disturbance.   ?All other systems reviewed and are negative. ? ?PAST MEDICAL/SURGICAL HISTORY:  ?Past Medical History:  ?Diagnosis Date  ? Anxiety   ? Hypertension   ? Irritable bowel disease   ? Osteoarthritis   ? ?Past Surgical History:  ?Procedure Laterality Date  ? ABDOMINAL HYSTERECTOMY  1974  ? CARDIAC CATHETERIZATION    ? 1991 and 2005  ? ? ?SOCIAL HISTORY:   ?Social History  ? ?Socioeconomic History  ? Marital status: Widowed  ?  Spouse name: Not on file  ? Number of children: Not on file  ? Years of education: Not on file  ? Highest education level: Not on file  ?Occupational History  ? Not on file  ?Tobacco Use  ? Smoking status: Never  ? Smokeless tobacco: Never  ?Vaping Use  ? Vaping Use: Never used  ?Substance and Sexual Activity  ? Alcohol use: No  ? Drug use: No  ? Sexual activity: Not on file  ?Other Topics Concern  ? Not on file  ?Social History Narrative  ? Not on file  ? ?Social Determinants of Health  ? ?Financial Resource Strain: Low Risk   ? Difficulty of Paying Living Expenses: Not very hard  ?Food Insecurity: No Food Insecurity  ? Worried About Charity fundraiser in the Last Year: Never true  ? Ran Out of Food in the Last Year: Never true  ?Transportation Needs: No Transportation Needs  ? Lack of Transportation (Medical): No  ? Lack of Transportation (Non-Medical): No  ?Physical Activity: Insufficiently Active  ? Days of Exercise per Week: 2 days  ? Minutes of Exercise per Session: 10 min  ?Stress: Stress Concern Present  ? Feeling of Stress : To some extent  ?Social Connections: Moderately Integrated  ? Frequency of Communication with Friends and Family: More than three times a week  ? Frequency of Social Gatherings with Friends and Family: Twice a week  ? Attends Religious Services: More  than 4 times per year  ? Active Member of Clubs or Organizations: Yes  ? Attends Archivist Meetings: 1 to 4 times per year  ? Marital Status: Widowed  ?Intimate Partner Violence: Not At Risk  ? Fear of Current or Ex-Partner: No  ? Emotionally Abused: No  ? Physically Abused: No  ? Sexually Abused: No  ? ? ?FAMILY HISTORY:  ?Family History  ?Problem Relation Age of Onset  ? Renal Disease Sister   ? Multiple myeloma Sister   ? ? ?CURRENT MEDICATIONS:  ?Current Outpatient Medications  ?Medication Sig Dispense Refill  ? acetaminophen (TYLENOL) 500 MG tablet  Take 1,000 mg by mouth every 6 (six) hours as needed for moderate pain.    ? allopurinol (ZYLOPRIM) 300 MG tablet Take 1 tablet (300 mg total) by mouth daily. 30 tablet 6  ? furosemide (LASIX) 20 MG tablet Take 20 mg by mouth daily.    ? LORazepam (ATIVAN) 0.5 MG tablet Take 0.5 mg by mouth 2 (two) times daily.    ? metoprolol tartrate (LOPRESSOR) 25 MG tablet Take 25 mg by mouth 2 (two) times daily.    ? mirtazapine (REMERON) 7.5 MG tablet Take 7.5 mg by mouth at bedtime.    ? olmesartan-hydrochlorothiazide (BENICAR HCT) 40-25 MG tablet Take 1 tablet by mouth daily.    ? potassium chloride (MICRO-K) 10 MEQ CR capsule Take 10 mEq by mouth daily.    ? ?No current facility-administered medications for this visit.  ? ? ?ALLERGIES:  ?Allergies  ?Allergen Reactions  ? Lidocaine Other (See Comments) and Hypertension  ?  Patient had chest tightness with stomach pain, numbness on left side of limbs and unable to speak  ? ? ?PHYSICAL EXAM:  ?Performance status (ECOG): 1 - Symptomatic but completely ambulatory ? ?Vitals:  ? 01/11/22 1018  ?BP: (!) 126/57  ?Pulse: 97  ?Resp: 18  ?Temp: 98 ?F (36.7 ?C)  ?SpO2: 98%  ? ?Wt Readings from Last 3 Encounters:  ?01/11/22 154 lb 14.4 oz (70.3 kg)  ?09/21/21 162 lb 11.2 oz (73.8 kg)  ?08/17/21 158 lb (71.7 kg)  ? ?Physical Exam ?Vitals reviewed.  ?Constitutional:   ?   Appearance: Normal appearance.  ?   Comments: In wheelchair  ?Cardiovascular:  ?   Rate and Rhythm: Normal rate and regular rhythm.  ?   Pulses: Normal pulses.  ?   Heart sounds: Normal heart sounds.  ?Pulmonary:  ?   Effort: Pulmonary effort is normal.  ?   Breath sounds: Normal breath sounds.  ?Musculoskeletal:  ?   Right lower leg: 2+ Edema present.  ?   Left lower leg: 2+ Edema present.  ?Neurological:  ?   General: No focal deficit present.  ?   Mental Status: She is alert and oriented to person, place, and time.  ?Psychiatric:     ?   Mood and Affect: Mood normal.     ?   Behavior: Behavior normal.  ?  ? ?LABORATORY  DATA:  ?I have reviewed the labs as listed.  ? ?  Latest Ref Rng & Units 01/11/2022  ?  9:42 AM 09/21/2021  ? 10:40 AM 06/29/2021  ?  2:03 PM  ?CBC  ?WBC 4.0 - 10.5 K/uL 79.7   67.4   41.3    ?Hemoglobin 12.0 - 15.0 g/dL 8.6   9.5   9.9    ?Hematocrit 36.0 - 46.0 % 27.2   28.5   29.7    ?Platelets 150 -  400 K/uL 153   160   184    ? ? ?  Latest Ref Rng & Units 01/11/2022  ?  9:42 AM 06/29/2021  ?  2:03 PM 05/26/2021  ? 12:50 PM  ?CMP  ?Glucose 70 - 99 mg/dL 142   129   80    ?BUN 8 - 23 mg/dL 36   21   18    ?Creatinine 0.44 - 1.00 mg/dL 1.33   0.99   0.93    ?Sodium 135 - 145 mmol/L 138   131   132    ?Potassium 3.5 - 5.1 mmol/L 4.2   4.3   4.1    ?Chloride 98 - 111 mmol/L 104   95   95    ?CO2 22 - 32 mmol/L _0 ?Calcium 8.9 - 10.3 mg/dL 8.5   8.4   9.1    ?Total Protein 6.5 - 8.1 g/dL 6.3   6.3   6.7    ?Total Bilirubin 0.3 - 1.2 mg/dL 0.5   0.4   0.7    ?Alkaline Phos 38 - 126 U/L 53   49   52    ?AST 15 - 41 U/L _1 ?ALT 0 - 44 U/L _2 ? ? ?DIAGNOSTIC IMAGING:  ?I have independently reviewed the scans and discussed with the patient. ?No results found.  ? ?ASSESSMENT:  ?B-cell lymphoproliferative disorder: ?- Flow cytometry on 05/10/2021 with monoclonal population of B cells with expression of CD5 with no significant CD20 expression.  Small to medium sized lymphocytes with coarse chromatin and frequent prominent nucleoli.  Differential includes atypical CLL versus mantle cell lymphoma. ?- FISH panel on 05/26/2021 shows 49% nuclei positive for 3 signals of CCND1 and 58% of nuclei positive for TP53 gene deletion. 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 ? ?  ?Social/family history: ?- She lives by herself at home.  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  She is accompanied by her daughter today.  He was non-smoker. ?- 2 of her sisters had multiple myeloma. ? ? ?PLAN:  ?B-cell lymphoproliferative disorder: ?- Differential diagnosis includes mantle cell lymphoma.  CCN D1 gene  arrangement was positive in 60% of nuclei. ?- She has a lymphocyte doubling time of 6 months or less. ?- She has lost 8 pounds.  She also has night sweats once per week.  She reportedly had recent flu infect

## 2022-01-11 NOTE — Patient Instructions (Signed)
[facility] ?Discharge Instructions ? ? ?You were seen and examined today by Dr. Delton Coombes. ? ?He discussed the results of your lab work. It shows that your lymphoma is now requiring treatment. We will arrange for you to have a bone marrow biopsy. We will also arrange for you to have a PET scan.  ? ?We sent a prescription to your pharmacy for Allopurinol.  This is to help get your uric acid down - it is high.  Take this pill every day as  ?prescribed.  ? ?We will also arrange for you to have a dose of IV iron to help improve your energy.  ? ?Return as scheduled.  ? ? ?Thank you for choosing [facility] to provide your oncology and hematology care.  To afford each patient quality time with our provider, please arrive at least 15 minutes before your scheduled appointment time.  ? ?If you have a lab appointment with the [facility] please come in thru the Main Entrance and check in at the main information desk. ? ?You need to re-schedule your appointment should you arrive 10 or more minutes late.  We strive to give you quality time with our providers, and arriving late affects you and other patients whose appointments are after yours.  Also, if you no show three or more times for appointments you may be dismissed from the clinic at the providers discretion.     ?Again, thank you for choosing [facility].  Our hope is that these requests will decrease the amount of time that you wait before being seen by our physicians.       ?_____________________________________________________________ ? ?Should you have questions after your visit to [facility], please contact our office at [phone] and follow the prompts.  Our office hours are 8:00 a.m. and 4:30 p.m. Monday - Friday.  Please note that voicemails left after 4:00 p.m. may not be returned until the following business day.  We are closed weekends and major  holidays.  You do have access to a nurse 24-7, just call the main number to the clinic [phone] and do not press any options, hold on the line and a nurse will answer the phone.   ? ?For prescription refill requests, have your pharmacy contact our office and allow 72 hours.   ? ?Due to Covid, you will need to wear a mask upon entering the hospital. If you do not have a mask, a mask will be given to you at the Main Entrance upon arrival. For doctor visits, patients may have 1 support person age 53 or older with them. For treatment visits, patients can not have anyone with them due to social distancing guidelines and our immunocompromised population.  ? ?   ?

## 2022-01-12 LAB — HEPATITIS C ANTIBODY: HCV Ab: NONREACTIVE

## 2022-01-12 LAB — HEPATITIS B CORE ANTIBODY, TOTAL: Hep B Core Total Ab: NONREACTIVE

## 2022-01-13 ENCOUNTER — Encounter (HOSPITAL_COMMUNITY): Payer: Self-pay | Admitting: Hematology

## 2022-01-19 ENCOUNTER — Encounter (HOSPITAL_COMMUNITY): Payer: Self-pay

## 2022-01-19 ENCOUNTER — Inpatient Hospital Stay (HOSPITAL_COMMUNITY): Payer: Medicare Other | Attending: Hematology

## 2022-01-19 ENCOUNTER — Inpatient Hospital Stay (HOSPITAL_COMMUNITY): Payer: Medicare Other

## 2022-01-19 VITALS — BP 107/51 | HR 78 | Temp 98.0°F | Resp 18

## 2022-01-19 DIAGNOSIS — K589 Irritable bowel syndrome without diarrhea: Secondary | ICD-10-CM | POA: Insufficient documentation

## 2022-01-19 DIAGNOSIS — M1990 Unspecified osteoarthritis, unspecified site: Secondary | ICD-10-CM | POA: Diagnosis not present

## 2022-01-19 DIAGNOSIS — I10 Essential (primary) hypertension: Secondary | ICD-10-CM | POA: Diagnosis not present

## 2022-01-19 DIAGNOSIS — C9110 Chronic lymphocytic leukemia of B-cell type not having achieved remission: Secondary | ICD-10-CM | POA: Diagnosis not present

## 2022-01-19 DIAGNOSIS — D72820 Lymphocytosis (symptomatic): Secondary | ICD-10-CM

## 2022-01-19 DIAGNOSIS — M542 Cervicalgia: Secondary | ICD-10-CM | POA: Diagnosis not present

## 2022-01-19 DIAGNOSIS — Z79899 Other long term (current) drug therapy: Secondary | ICD-10-CM | POA: Diagnosis not present

## 2022-01-19 DIAGNOSIS — D500 Iron deficiency anemia secondary to blood loss (chronic): Secondary | ICD-10-CM

## 2022-01-19 DIAGNOSIS — D509 Iron deficiency anemia, unspecified: Secondary | ICD-10-CM | POA: Insufficient documentation

## 2022-01-19 DIAGNOSIS — E790 Hyperuricemia without signs of inflammatory arthritis and tophaceous disease: Secondary | ICD-10-CM | POA: Diagnosis not present

## 2022-01-19 DIAGNOSIS — M7989 Other specified soft tissue disorders: Secondary | ICD-10-CM | POA: Insufficient documentation

## 2022-01-19 DIAGNOSIS — R61 Generalized hyperhidrosis: Secondary | ICD-10-CM | POA: Diagnosis not present

## 2022-01-19 LAB — HEPATITIS B SURFACE ANTIBODY,QUALITATIVE: Hep B S Ab: REACTIVE — AB

## 2022-01-19 LAB — HEPATITIS B SURFACE ANTIGEN: Hepatitis B Surface Ag: NONREACTIVE

## 2022-01-19 MED ORDER — ACETAMINOPHEN 325 MG PO TABS
650.0000 mg | ORAL_TABLET | Freq: Once | ORAL | Status: AC
  Administered 2022-01-19: 650 mg via ORAL
  Filled 2022-01-19: qty 2

## 2022-01-19 MED ORDER — LORATADINE 10 MG PO TABS
10.0000 mg | ORAL_TABLET | Freq: Once | ORAL | Status: AC
  Administered 2022-01-19: 10 mg via ORAL
  Filled 2022-01-19: qty 1

## 2022-01-19 MED ORDER — SODIUM CHLORIDE 0.9 % IV SOLN
400.0000 mg | Freq: Once | INTRAVENOUS | Status: AC
  Administered 2022-01-19: 400 mg via INTRAVENOUS
  Filled 2022-01-19: qty 20

## 2022-01-19 MED ORDER — SODIUM CHLORIDE 0.9 % IV SOLN: INTRAVENOUS | Status: DC

## 2022-01-19 NOTE — Patient Instructions (Signed)
[facility]  Discharge Instructions: Thank you for choosing [facility] to provide your oncology and hematology care.  If you have a lab appointment with the Cancer Center, please come in thru the Main Entrance and check in at the main information desk.  Wear comfortable clothing and clothing appropriate for easy access to any Portacath or PICC line.   We strive to give you quality time with your provider. You may need to reschedule your appointment if you arrive late (15 or more minutes).  Arriving late affects you and other patients whose appointments are after yours.  Also, if you miss three or more appointments without notifying the office, you may be dismissed from the clinic at the provider's discretion.      For prescription refill requests, have your pharmacy contact our office and allow 72 hours for refills to be completed.    Today you received the following: Venofer, return as scheduled.   To help prevent nausea and vomiting after your treatment, we encourage you to take your nausea medication as directed.  BELOW ARE SYMPTOMS THAT SHOULD BE REPORTED IMMEDIATELY: *FEVER GREATER THAN 100.4 F (38 C) OR HIGHER *CHILLS OR SWEATING *NAUSEA AND VOMITING THAT IS NOT CONTROLLED WITH YOUR NAUSEA MEDICATION *UNUSUAL SHORTNESS OF BREATH *UNUSUAL BRUISING OR BLEEDING *URINARY PROBLEMS (pain or burning when urinating, or frequent urination) *BOWEL PROBLEMS (unusual diarrhea, constipation, pain near the anus) TENDERNESS IN MOUTH AND THROAT WITH OR WITHOUT PRESENCE OF ULCERS (sore throat, sores in mouth, or a toothache) UNUSUAL RASH, SWELLING OR PAIN  UNUSUAL VAGINAL DISCHARGE OR ITCHING   Items with * indicate a potential emergency and should be followed up as soon as possible or go to the Emergency Department if any problems should occur.  Please show the CHEMOTHERAPY ALERT CARD or IMMUNOTHERAPY ALERT CARD at check-in to the Emergency Department and triage  nurse.  Should you have questions after your visit or need to cancel or reschedule your appointment, please contact [facility] [phone]  and follow the prompts.  Office hours are 8:00 a.m. to 4:30 p.m. Monday - Friday. Please note that voicemails left after 4:00 p.m. may not be returned until the following business day.  We are closed weekends and major holidays. You have access to a nurse at all times for urgent questions. Please call the main number to the clinic [phone] and follow the prompts.  For any non-urgent questions, you may also contact your provider using MyChart. We now offer e-Visits for anyone 18 and older to request care online for non-urgent symptoms. For details visit [email or web address].   Also download the MyChart app! Go to the app store, search "MyChart", open the app, select [facility], and log in with your MyChart username and password.  Due to Covid, a mask is required upon entering the hospital/clinic. If you do not have a mask, one will be given to you upon arrival. For doctor visits, patients may have 1 support person aged 18 or older with them. For treatment visits, patients cannot have anyone with them due to current Covid guidelines and our immunocompromised population.  

## 2022-01-19 NOTE — Progress Notes (Signed)
Patient tolerated iron infusion with no complaints voiced.  Peripheral IV site clean and dry with good blood return noted before and after infusion.  Band aid applied.  VSS with discharge and left in satisfactory condition with no s/s of distress noted.   

## 2022-01-21 ENCOUNTER — Ambulatory Visit (HOSPITAL_COMMUNITY)
Admission: RE | Admit: 2022-01-21 | Discharge: 2022-01-21 | Disposition: A | Discharge status: Home / Self Care | Payer: Medicare Other | Source: Ambulatory Visit | Attending: Hematology | Admitting: Hematology

## 2022-01-21 DIAGNOSIS — D500 Iron deficiency anemia secondary to blood loss (chronic): Secondary | ICD-10-CM | POA: Diagnosis not present

## 2022-01-21 DIAGNOSIS — I700 Atherosclerosis of aorta: Secondary | ICD-10-CM | POA: Diagnosis not present

## 2022-01-21 DIAGNOSIS — I517 Cardiomegaly: Secondary | ICD-10-CM | POA: Diagnosis not present

## 2022-01-21 DIAGNOSIS — D72820 Lymphocytosis (symptomatic): Secondary | ICD-10-CM | POA: Diagnosis not present

## 2022-01-21 DIAGNOSIS — K8020 Calculus of gallbladder without cholecystitis without obstruction: Secondary | ICD-10-CM | POA: Diagnosis not present

## 2022-01-21 MED ORDER — FLUDEOXYGLUCOSE F - 18 (FDG) INJECTION
8.3000 | Freq: Once | INTRAVENOUS | Status: AC | PRN
  Administered 2022-01-21: 8.3 via INTRAVENOUS

## 2022-01-29 ENCOUNTER — Emergency Department (HOSPITAL_COMMUNITY): Payer: Medicare Other

## 2022-01-29 ENCOUNTER — Emergency Department (HOSPITAL_COMMUNITY)
Admission: EM | Admit: 2022-01-29 | Discharge: 2022-01-29 | Disposition: A | Discharge status: Home / Self Care | Payer: Medicare Other | Attending: Emergency Medicine | Admitting: Emergency Medicine

## 2022-01-29 ENCOUNTER — Encounter (HOSPITAL_COMMUNITY): Payer: Self-pay

## 2022-01-29 ENCOUNTER — Other Ambulatory Visit: Payer: Self-pay

## 2022-01-29 DIAGNOSIS — N3289 Other specified disorders of bladder: Secondary | ICD-10-CM | POA: Diagnosis not present

## 2022-01-29 DIAGNOSIS — R39198 Other difficulties with micturition: Secondary | ICD-10-CM | POA: Insufficient documentation

## 2022-01-29 DIAGNOSIS — Z79899 Other long term (current) drug therapy: Secondary | ICD-10-CM | POA: Insufficient documentation

## 2022-01-29 DIAGNOSIS — C9110 Chronic lymphocytic leukemia of B-cell type not having achieved remission: Secondary | ICD-10-CM | POA: Diagnosis not present

## 2022-01-29 DIAGNOSIS — R944 Abnormal results of kidney function studies: Secondary | ICD-10-CM | POA: Diagnosis not present

## 2022-01-29 DIAGNOSIS — I129 Hypertensive chronic kidney disease with stage 1 through stage 4 chronic kidney disease, or unspecified chronic kidney disease: Secondary | ICD-10-CM | POA: Diagnosis not present

## 2022-01-29 DIAGNOSIS — R7989 Other specified abnormal findings of blood chemistry: Secondary | ICD-10-CM | POA: Insufficient documentation

## 2022-01-29 DIAGNOSIS — R5381 Other malaise: Secondary | ICD-10-CM | POA: Diagnosis not present

## 2022-01-29 DIAGNOSIS — R161 Splenomegaly, not elsewhere classified: Secondary | ICD-10-CM | POA: Diagnosis not present

## 2022-01-29 DIAGNOSIS — Z743 Need for continuous supervision: Secondary | ICD-10-CM | POA: Diagnosis not present

## 2022-01-29 DIAGNOSIS — D72829 Elevated white blood cell count, unspecified: Secondary | ICD-10-CM | POA: Insufficient documentation

## 2022-01-29 DIAGNOSIS — N1832 Chronic kidney disease, stage 3b: Secondary | ICD-10-CM | POA: Diagnosis not present

## 2022-01-29 DIAGNOSIS — R339 Retention of urine, unspecified: Secondary | ICD-10-CM | POA: Diagnosis present

## 2022-01-29 DIAGNOSIS — R69 Illness, unspecified: Secondary | ICD-10-CM | POA: Diagnosis not present

## 2022-01-29 DIAGNOSIS — K8020 Calculus of gallbladder without cholecystitis without obstruction: Secondary | ICD-10-CM | POA: Diagnosis not present

## 2022-01-29 DIAGNOSIS — M7989 Other specified soft tissue disorders: Secondary | ICD-10-CM | POA: Diagnosis not present

## 2022-01-29 DIAGNOSIS — I700 Atherosclerosis of aorta: Secondary | ICD-10-CM | POA: Diagnosis not present

## 2022-01-29 LAB — URINALYSIS, ROUTINE W REFLEX MICROSCOPIC
Bilirubin Urine: NEGATIVE
Glucose, UA: NEGATIVE mg/dL
Ketones, ur: NEGATIVE mg/dL
Nitrite: NEGATIVE
Protein, ur: 30 mg/dL — AB
Specific Gravity, Urine: 1.01 (ref 1.005–1.030)
pH: 5 (ref 5.0–8.0)

## 2022-01-29 LAB — CBC WITH DIFFERENTIAL/PLATELET
Basophils Absolute: 0 10*3/uL (ref 0.0–0.1)
Basophils Relative: 0 %
Eosinophils Absolute: 0 10*3/uL (ref 0.0–0.5)
Eosinophils Relative: 0 %
HCT: 27 % — ABNORMAL LOW (ref 36.0–46.0)
Hemoglobin: 8.7 g/dL — ABNORMAL LOW (ref 12.0–15.0)
Lymphocytes Relative: 89 %
Lymphs Abs: 54.7 10*3/uL — ABNORMAL HIGH (ref 0.7–4.0)
MCH: 32.6 pg (ref 26.0–34.0)
MCHC: 32.2 g/dL (ref 30.0–36.0)
MCV: 101.1 fL — ABNORMAL HIGH (ref 80.0–100.0)
Monocytes Absolute: 0.6 10*3/uL (ref 0.1–1.0)
Monocytes Relative: 1 %
Neutro Abs: 6.2 10*3/uL (ref 1.7–7.7)
Neutrophils Relative %: 10 %
Platelets: 149 10*3/uL — ABNORMAL LOW (ref 150–400)
RBC: 2.67 MIL/uL — ABNORMAL LOW (ref 3.87–5.11)
RDW: 17.2 % — ABNORMAL HIGH (ref 11.5–15.5)
WBC: 61.5 10*3/uL (ref 4.0–10.5)
nRBC: 0 % (ref 0.0–0.2)

## 2022-01-29 LAB — COMPREHENSIVE METABOLIC PANEL
ALT: 16 U/L (ref 0–44)
AST: 31 U/L (ref 15–41)
Albumin: 4.1 g/dL (ref 3.5–5.0)
Alkaline Phosphatase: 56 U/L (ref 38–126)
Anion gap: 9 (ref 5–15)
BUN: 32 mg/dL — ABNORMAL HIGH (ref 8–23)
CO2: 27 mmol/L (ref 22–32)
Calcium: 8.8 mg/dL — ABNORMAL LOW (ref 8.9–10.3)
Chloride: 102 mmol/L (ref 98–111)
Creatinine, Ser: 1.47 mg/dL — ABNORMAL HIGH (ref 0.44–1.00)
GFR, Estimated: 34 mL/min — ABNORMAL LOW (ref >60)
Glucose, Bld: 100 mg/dL — ABNORMAL HIGH (ref 70–99)
Potassium: 4 mmol/L (ref 3.5–5.1)
Sodium: 138 mmol/L (ref 135–145)
Total Bilirubin: 0.5 mg/dL (ref 0.3–1.2)
Total Protein: 6.4 g/dL — ABNORMAL LOW (ref 6.5–8.1)

## 2022-01-29 MED ORDER — SODIUM CHLORIDE 0.9 % IV BOLUS
500.0000 mL | Freq: Once | INTRAVENOUS | Status: AC
  Administered 2022-01-29: 500 mL via INTRAVENOUS

## 2022-01-29 MED ORDER — SODIUM CHLORIDE 0.9 % IV SOLN
INTRAVENOUS | Status: DC
Start: 2022-01-29 — End: 2022-01-30

## 2022-01-29 NOTE — ED Notes (Signed)
Date and time results received: 01/29/22 12:03 PM ? ? ?Test: WBC  ?Critical Value: 61.5 ? ?Name of Provider Notified: Dr. Rogene Houston ? ?Orders Received? Or Actions Taken?: See Order ?

## 2022-01-29 NOTE — ED Provider Notes (Signed)
?[facility] ?Provider Note ? ? ?CSN: [id number] ?Arrival date & time: 01/29/22  1006 ? ?  ? ?History ? ?Chief Complaint  ?Patient presents with  ? Urinary Retention  ? ? ?Faith Lee is a 88 y.o. female. ? ?Patient states she is here because she has not been able to urinate for 24 hours.  Patient is followed by hematology oncology upstairs for chronic lymphocytic leukemia lymphocytosis and normocytic anemia.  Other significant past medical history is hypertension irritable bowel anxiety she has had abdominal hysterectomy.  Patient is on Lasix patient has chronic lower extremity swelling.  Patient without any other complaints. ? ? ?  ? ?Home Medications ?Prior to Admission medications   ?Medication Sig Start Date End Date Taking? Authorizing Provider  ?acetaminophen (TYLENOL) 500 MG tablet Take 1,000 mg by mouth every 6 (six) hours as needed for moderate pain.   Yes [provider]  ?allopurinol (ZYLOPRIM) 300 MG tablet Take 1 tablet (300 mg total) by mouth daily. 01/11/22  Yes Derek Jack, MD  ?furosemide (LASIX) 20 MG tablet Take 20 mg by mouth daily. 05/02/21  Yes [provider]  ?LORazepam (ATIVAN) 0.5 MG tablet Take 0.5 mg by mouth 2 (two) times daily. 01/26/21  Yes [provider]  ?metoprolol tartrate (LOPRESSOR) 25 MG tablet Take 25 mg by mouth 2 (two) times daily. 05/02/21  Yes [provider]  ?mirtazapine (REMERON) 7.5 MG tablet Take 7.5 mg by mouth at bedtime. 12/14/21  Yes [provider]  ?olmesartan-hydrochlorothiazide (BENICAR HCT) 40-25 MG tablet Take 1 tablet by mouth daily. 05/29/21  Yes [provider]  ?potassium chloride (MICRO-K) 10 MEQ CR capsule Take 10 mEq by mouth daily. 05/02/21  Yes [provider]  ?   ? ?Allergies    ?Lidocaine   ? ?Review of Systems   ?Review of Systems  ?Constitutional:  Negative for chills and fever.  ?HENT:  Negative for ear pain and sore throat.   ?Eyes:  Negative for pain  and visual disturbance.  ?Respiratory:  Negative for cough and shortness of breath.   ?Cardiovascular:  Positive for leg swelling. Negative for chest pain and palpitations.  ?Gastrointestinal:  Negative for abdominal pain and vomiting.  ?Genitourinary:  Positive for decreased urine volume. Negative for dysuria and hematuria.  ?Musculoskeletal:  Negative for arthralgias and back pain.  ?Skin:  Negative for color change and rash.  ?Neurological:  Negative for seizures and syncope.  ?All other systems reviewed and are negative. ? ?Physical Exam ?Updated Vital Signs ?BP 124/62   Pulse 84   Temp 98.4 ?F (36.9 ?C) (Oral)   Resp 19   Ht 1.626 m (_0 )   Wt 71 kg   SpO2 98%   BMI 26.87 kg/m?  ?Physical Exam ?Vitals and nursing note reviewed.  ?Constitutional:   ?   General: She is not in acute distress. ?   Appearance: Normal appearance. She is well-developed.  ?HENT:  ?   Head: Normocephalic and atraumatic.  ?Eyes:  ?   Extraocular Movements: Extraocular movements intact.  ?   Conjunctiva/sclera: Conjunctivae normal.  ?   Pupils: Pupils are equal, round, and reactive to light.  ?Cardiovascular:  ?   Rate and Rhythm: Normal rate and regular rhythm.  ?   Heart sounds: No murmur heard. ?Pulmonary:  ?   Effort: Pulmonary effort is normal. No respiratory distress.  ?   Breath sounds: Normal breath sounds.  ?Abdominal:  ?   General: There is no  distension.  ?   Palpations: Abdomen is soft.  ?   Tenderness: There is no abdominal tenderness. There is no guarding.  ?Musculoskeletal:     ?   General: No swelling.  ?   Cervical back: Normal range of motion and neck supple.  ?   Right lower leg: Edema present.  ?   Left lower leg: Edema present.  ?Skin: ?   General: Skin is warm and dry.  ?   Capillary Refill: Capillary refill takes less than 2 seconds.  ?Neurological:  ?   General: No focal deficit present.  ?   Mental Status: She is alert and oriented to person, place, and time.  ?Psychiatric:     ?   Mood and Affect: Mood  normal.  ? ? ?ED Results / Procedures / Treatments   ?Labs ?(all labs ordered are listed, but only abnormal results are displayed) ?Labs Reviewed  ?CBC WITH DIFFERENTIAL/PLATELET - Abnormal; Notable for the following components:  ?    Result Value  ? WBC 61.5 (*)   ? RBC 2.67 (*)   ? Hemoglobin 8.7 (*)   ? HCT 27.0 (*)   ? MCV 101.1 (*)   ? RDW 17.2 (*)   ? Platelets 149 (*)   ? Lymphs Abs 54.7 (*)   ? All other components within normal limits  ?COMPREHENSIVE METABOLIC PANEL - Abnormal; Notable for the following components:  ? Glucose, Bld 100 (*)   ? BUN 32 (*)   ? Creatinine, Ser 1.47 (*)   ? Calcium 8.8 (*)   ? Total Protein 6.4 (*)   ? GFR, Estimated 34 (*)   ? All other components within normal limits  ?URINALYSIS, ROUTINE W REFLEX MICROSCOPIC  ? ? ?EKG ?None ? ?Radiology ?CT Renal Stone Study ? ?Result Date: 01/29/2022 ?CLINICAL DATA:  Neurogenic bladder dysfunction EXAM: CT ABDOMEN AND PELVIS WITHOUT CONTRAST TECHNIQUE: Multidetector CT imaging of the abdomen and pelvis was performed following the standard protocol without IV contrast. RADIATION DOSE REDUCTION: This exam was performed according to the departmental dose-optimization program which includes automated exposure control, adjustment of the mA and/or kV according to patient size and/or use of iterative reconstruction technique. COMPARISON:  PET-CT dated January 21, 2022 FINDINGS: Lower chest: Bibasilar atelectasis. Hepatobiliary: No suspicious liver lesions. Cholelithiasis. No gallbladder wall thickening. Limited evaluation for biliary ductal dilation due to lack of IV contrast. Pancreas: Unremarkable. No pancreatic ductal dilatation or surrounding inflammatory changes. Spleen: Marked splenomegaly, unchanged compared with prior PET-CT. Adrenals/Urinary Tract: Medial displacement of the left kidney secondary to marked splenomegaly. Prominent left extrarenal pelvis. No hydronephrosis or nephrolithiasis. Moderate distension of the urinary bladder.  Stomach/Bowel: Stomach is within normal limits. Appendix is not visualized. No evidence of bowel wall thickening, distention, or inflammatory changes. Vascular/Lymphatic: Enlarged lymph nodes of the hepatic duodenal ligament and retroperitoneal space, unchanged when compared with recent prior PET-CT. Atherosclerotic disease of the abdominal aorta. Reproductive: No adnexal masses. Other: Nonspecific trace free fluid seen in the pelvis. Musculoskeletal: No acute or significant osseous findings. IMPRESSION: 1. Moderate distention of the urinary bladder, compatible with history of neurogenic bladder dysfunction. 2. Marked splenomegaly and abdominal lymphadenopathy, unchanged compared with prior PET-CT. 3.  Aortic Atherosclerosis (ICD10-I70.0). Electronically Signed   By: Yetta Glassman M.D.   On: 01/29/2022 13:11   ? ?Procedures ?Procedures  ? ? ?Medications Ordered in ED ?Medications  ?0.9 %  sodium chloride infusion ( Intravenous New Bag/Given 01/29/22 1346)  ?sodium chloride 0.9 % bolus 500 mL (  0 mLs Intravenous Stopped 01/29/22 1346)  ? ? ?ED Course/ Medical Decision Making/ A&P ?  ?                        ?Medical Decision Making ?Amount and/or Complexity of Data Reviewed ?Labs: ordered. ?Radiology: ordered. ? ?Risk ?Prescription drug management. ? ?Bladder scan showed that patient only had about 250 cc of urine in the bladder.  We will get labs.  CBC is back white count is 61,000 most recently it was 71,000 patient known to have chronic lymphocytic leukemia.  And patient has 54% absolute lymphocytes.  So that would go along with that. ? ?Complete metabolic panel is now back.  BUN is 32 creatinine is 1.47 for a GFR of 34 potassium is normal glucose is at 100 rest of liver function test are normal.  Bilirubin 0.5 alk phos is 56 no anion gap. ? ?Patient's GFR was 38 just the end of March.  So no significant change. ? ?Because of the decreased urine output will get a CT renal study just to make sure there is no  blockage.  We will give patient some IV fluids. ? ?Patient was just able to urinate on her own.  She actually voided a fair amount.  We will send urine for urinalysis.  And wait to have that checked.  We will still plan on

## 2022-01-29 NOTE — ED Triage Notes (Signed)
Pt c/o unable to urinate, legs swollen, decrease in PO intake. Pt lives alone and stated no one has come to see about her.  ?

## 2022-01-29 NOTE — ED Notes (Signed)
Bladder scan 35 ml

## 2022-01-29 NOTE — ED Notes (Signed)
Attempted bedpan use, pt urinated before able to get bedpan under pt. Pt cleaned, new linen and pad. ?

## 2022-01-29 NOTE — Discharge Instructions (Addendum)
Make an appointment to follow-up with urology to evaluate voiding problems.  Make an appoint follow back up with your regular doctor.  Keep your appointment with Dr. Raliegh Ip up in hematology oncology.  Today's work-up without any acute findings.  The renal function a little bit worse than usual so want that rechecked in about 5 days. ?

## 2022-01-29 NOTE — ED Provider Notes (Signed)
3:30 PM-checkout from Dr. Rogene Houston to evaluate-after return urinalysis. ? ?Postvoid bladder scan-35 mL of urine in the urinary bladder. ? ?6:20 PM-urinalysis with small hemoglobin, increased protein, trace leukocytes, 11-20 white cells and rare bacteria. ? ?At this time the patient is comfortable, and hungry.  She states she has not eaten anything since yesterday.  She denies having dysuria.  She is unable to specify urine frequency. ? ?MDM-nonspecific symptoms, patient has urinated well.  Doubt UTI, significant metabolic abnormality or serious bacterial infection.  She is stable for discharge.  Plan will be to have her follow-up with her PCP for recheck of labs next week. ?  ?Daleen Bo, MD ?01/29/22 1836 ? ?

## 2022-01-29 NOTE — ED Notes (Signed)
252 ml of urine noted.   ?

## 2022-02-01 ENCOUNTER — Inpatient Hospital Stay (HOSPITAL_BASED_OUTPATIENT_CLINIC_OR_DEPARTMENT_OTHER): Payer: Medicare Other | Admitting: Hematology

## 2022-02-01 VITALS — BP 113/54 | HR 75 | Temp 98.5°F | Resp 18 | Ht 64.0 in | Wt 160.3 lb

## 2022-02-01 DIAGNOSIS — D72820 Lymphocytosis (symptomatic): Secondary | ICD-10-CM

## 2022-02-01 DIAGNOSIS — M542 Cervicalgia: Secondary | ICD-10-CM | POA: Diagnosis not present

## 2022-02-01 DIAGNOSIS — K589 Irritable bowel syndrome without diarrhea: Secondary | ICD-10-CM | POA: Diagnosis not present

## 2022-02-01 DIAGNOSIS — D649 Anemia, unspecified: Secondary | ICD-10-CM

## 2022-02-01 DIAGNOSIS — D500 Iron deficiency anemia secondary to blood loss (chronic): Secondary | ICD-10-CM

## 2022-02-01 DIAGNOSIS — M7989 Other specified soft tissue disorders: Secondary | ICD-10-CM | POA: Diagnosis not present

## 2022-02-01 DIAGNOSIS — D509 Iron deficiency anemia, unspecified: Secondary | ICD-10-CM | POA: Diagnosis not present

## 2022-02-01 DIAGNOSIS — C9110 Chronic lymphocytic leukemia of B-cell type not having achieved remission: Secondary | ICD-10-CM | POA: Diagnosis not present

## 2022-02-01 DIAGNOSIS — Z79899 Other long term (current) drug therapy: Secondary | ICD-10-CM | POA: Diagnosis not present

## 2022-02-01 DIAGNOSIS — I10 Essential (primary) hypertension: Secondary | ICD-10-CM | POA: Diagnosis not present

## 2022-02-01 DIAGNOSIS — R61 Generalized hyperhidrosis: Secondary | ICD-10-CM | POA: Diagnosis not present

## 2022-02-01 DIAGNOSIS — E790 Hyperuricemia without signs of inflammatory arthritis and tophaceous disease: Secondary | ICD-10-CM | POA: Diagnosis not present

## 2022-02-01 DIAGNOSIS — M1990 Unspecified osteoarthritis, unspecified site: Secondary | ICD-10-CM | POA: Diagnosis not present

## 2022-02-01 NOTE — Patient Instructions (Signed)
[place] at [facility] ?Discharge Instructions ? ? ?You were seen and examined today by Dr. Delton Coombes. ? ?He reviewed your lab work.  Your hemoglobin remains low even after receiving iron.   ? ?Keep the appointment for your bone marrow biopsy on 4/21.  ? ?We will also refer you to a local surgeon to try to get a biopsy on the lymph node in your neck.  ? ?Return as scheduled.  ? ? ?Thank you for choosing [place] at [facility] to provide your oncology and hematology care.  To afford each patient quality time with our provider, please arrive at least 15 minutes before your scheduled appointment time.  ? ?If you have a lab appointment with the [facility] please come in thru the Main Entrance and check in at the main information desk. ? ?You need to re-schedule your appointment should you arrive 10 or more minutes late.  We strive to give you quality time with our providers, and arriving late affects you and other patients whose appointments are after yours.  Also, if you no show three or more times for appointments you may be dismissed from the clinic at the providers discretion.     ?Again, thank you for choosing [facility].  Our hope is that these requests will decrease the amount of time that you wait before being seen by our physicians.       ?_____________________________________________________________ ? ?Should you have questions after your visit to [facility], please contact our office at [phone] and follow the prompts.  Our office hours are 8:00 a.m. and 4:30 p.m. Monday - Friday.  Please note that voicemails left after 4:00 p.m. may not be returned until the following business day.  We are closed weekends and major holidays.  You do have access to a nurse 24-7, just call the main number to the clinic [phone] and do not press any options, hold on the line and a nurse will answer the phone.   ? ?For prescription  refill requests, have your pharmacy contact our office and allow 72 hours.   ? ?Due to Covid, you will need to wear a mask upon entering the hospital. If you do not have a mask, a mask will be given to you at the Main Entrance upon arrival. For doctor visits, patients may have 1 support person age 33 or older with them. For treatment visits, patients can not have anyone with them due to social distancing guidelines and our immunocompromised population.  ? ?   ?

## 2022-02-01 NOTE — Progress Notes (Signed)
? ?[facility] ?[address] ?[place], [place] [address] ? ? ?CLINIC:  ?Medical Oncology/Hematology ? ?PCP:  ?Asencion Noble, MD ?[address] / [place] [address]  ?[phone] ? ?REASON FOR VISIT:  ?Follow-up for CLL, lymphocytosis, and normocytic anemia  ? ?PRIOR THERAPY: none ? ?CURRENT THERAPY: Intermittent IV Iron (Venofer) ? ?INTERVAL HISTORY:  ?[name], a 88 y.o. female, returns for routine follow-up for her CLL, lymphocytosis, and normocytic anemia. Faith Lee was last seen on 01/11/2022. ? ?Today she reports feeling good. She presented to the ED 4/14 because she was unable to urinate; she reports this has since resolved, and she is now able to urinate. She reports pain on the left side of her neck. She is eating well, and her energy is improving. She reports night sweats on the back of her head.  ? ?REVIEW OF SYSTEMS:  ?Review of Systems  ?Constitutional:  Negative for appetite change and fatigue.  ?Respiratory:  Positive for shortness of breath.   ?Gastrointestinal:  Positive for constipation.  ?Endocrine: Positive for hot flashes (night sweats).  ?Genitourinary:  Positive for difficulty urinating.   ?Musculoskeletal:  Positive for neck pain (L side).  ?Psychiatric/Behavioral:  Positive for depression. The patient is nervous/anxious.   ?All other systems reviewed and are negative. ? ?PAST MEDICAL/SURGICAL HISTORY:  ?Past Medical History:  ?Diagnosis Date  ? Anxiety   ? Hypertension   ? Irritable bowel disease   ? Osteoarthritis   ? ?Past Surgical History:  ?Procedure Laterality Date  ? ABDOMINAL HYSTERECTOMY  1974  ? CARDIAC CATHETERIZATION    ? 1991 and 2005  ? ? ?SOCIAL HISTORY:  ?Social History  ? ?Socioeconomic History  ? Marital status: Widowed  ?  Spouse name: Not on file  ? Number of children: Not on file  ? Years of education: Not on file  ? Highest education level: Not on file  ?Occupational History  ? Not on file  ?Tobacco Use  ? Smoking status: Never  ?  Smokeless tobacco: Never  ?Vaping Use  ? Vaping Use: Never used  ?Substance and Sexual Activity  ? Alcohol use: No  ? Drug use: No  ? Sexual activity: Not on file  ?Other Topics Concern  ? Not on file  ?Social History Narrative  ? Not on file  ? ?Social Determinants of Health  ? ?Financial Resource Strain: Low Risk   ? Difficulty of Paying Living Expenses: Not very hard  ?Food Insecurity: No Food Insecurity  ? Worried About Charity fundraiser in the Last Year: Never true  ? Ran Out of Food in the Last Year: Never true  ?Transportation Needs: No Transportation Needs  ? Lack of Transportation (Medical): No  ? Lack of Transportation (Non-Medical): No  ?Physical Activity: Insufficiently Active  ? Days of Exercise per Week: 2 days  ? Minutes of Exercise per Session: 10 min  ?Stress: Stress Concern Present  ? Feeling of Stress : To some extent  ?Social Connections: Moderately Integrated  ? Frequency of Communication with Friends and Family: More than three times a week  ? Frequency of Social Gatherings with Friends and Family: Twice a week  ? Attends Religious Services: More than 4 times per year  ? Active Member of Clubs or Organizations: Yes  ? Attends Archivist Meetings: 1 to 4 times per year  ? Marital Status: Widowed  ?Intimate Partner Violence: Not At Risk  ? Fear of Current or Ex-Partner: No  ?  Emotionally Abused: No  ? Physically Abused: No  ? Sexually Abused: No  ? ? ?FAMILY HISTORY:  ?Family History  ?Problem Relation Age of Onset  ? Renal Disease Sister   ? Multiple myeloma Sister   ? ? ?CURRENT MEDICATIONS:  ?Current Outpatient Medications  ?Medication Sig Dispense Refill  ? acetaminophen (TYLENOL) 500 MG tablet Take 1,000 mg by mouth every 6 (six) hours as needed for moderate pain.    ? allopurinol (ZYLOPRIM) 300 MG tablet Take 1 tablet (300 mg total) by mouth daily. 30 tablet 6  ? furosemide (LASIX) 20 MG tablet Take 20 mg by mouth daily.    ? LORazepam (ATIVAN) 0.5 MG tablet Take 0.5 mg by mouth  2 (two) times daily.    ? metoprolol tartrate (LOPRESSOR) 25 MG tablet Take 25 mg by mouth 2 (two) times daily.    ? mirtazapine (REMERON) 7.5 MG tablet Take 7.5 mg by mouth at bedtime.    ? olmesartan-hydrochlorothiazide (BENICAR HCT) 40-25 MG tablet Take 1 tablet by mouth daily.    ? potassium chloride (MICRO-K) 10 MEQ CR capsule Take 10 mEq by mouth daily.    ? ?No current facility-administered medications for this visit.  ? ? ?ALLERGIES:  ?Allergies  ?Allergen Reactions  ? Lidocaine Other (See Comments) and Hypertension  ?  Patient had chest tightness with stomach pain, numbness on left side of limbs and unable to speak  ? ? ?PHYSICAL EXAM:  ?Performance status (ECOG): 1 - Symptomatic but completely ambulatory ? ?Vitals:  ? 02/01/22 1110  ?BP: (!) 113/54  ?Pulse: 75  ?Resp: 18  ?Temp: 98.5 ?F (36.9 ?C)  ?SpO2: 100%  ? ?Wt Readings from Last 3 Encounters:  ?02/01/22 160 lb 4.8 oz (72.7 kg)  ?01/29/22 156 lb 8.4 oz (71 kg)  ?01/11/22 154 lb 14.4 oz (70.3 kg)  ? ?Physical Exam ?Vitals reviewed.  ?Constitutional:   ?   Appearance: Normal appearance.  ?   Comments: In wheelchair  ?Cardiovascular:  ?   Rate and Rhythm: Normal rate and regular rhythm.  ?   Pulses: Normal pulses.  ?   Heart sounds: Normal heart sounds.  ?Pulmonary:  ?   Effort: Pulmonary effort is normal.  ?   Breath sounds: Normal breath sounds.  ?Musculoskeletal:  ?   Right lower leg: 3+ Edema present.  ?   Left lower leg: 3+ Edema present.  ?Neurological:  ?   General: No focal deficit present.  ?   Mental Status: She is alert and oriented to person, place, and time.  ?Psychiatric:     ?   Mood and Affect: Mood normal.     ?   Behavior: Behavior normal.  ? ? ?LABORATORY DATA:  ?I have reviewed the labs as listed.  ? ?  Latest Ref Rng & Units 01/29/2022  ? 11:28 AM 01/11/2022  ?  9:42 AM 09/21/2021  ? 10:40 AM  ?CBC  ?WBC 4.0 - 10.5 K/uL 61.5   79.7   67.4    ?Hemoglobin 12.0 - 15.0 g/dL 8.7   8.6   9.5    ?Hematocrit 36.0 - 46.0 % 27.0   27.2   28.5     ?Platelets 150 - 400 K/uL 149   153   160    ? ? ?  Latest Ref Rng & Units 01/29/2022  ? 11:28 AM 01/11/2022  ?  9:42 AM 06/29/2021  ?  2:03 PM  ?CMP  ?Glucose 70 - 99 mg/dL 629  142   129    ?BUN 8 - 23 mg/dL 32   36   21    ?Creatinine 0.44 - 1.00 mg/dL 1.47   1.33   0.99    ?Sodium 135 - 145 mmol/L 138   138   131    ?Potassium 3.5 - 5.1 mmol/L 4.0   4.2   4.3    ?Chloride 98 - 111 mmol/L 102   104   95    ?CO2 22 - 32 mmol/L $RemoveB'27   26   28    'EoTkmBUK$ ?Calcium 8.9 - 10.3 mg/dL 8.8   8.5   8.4    ?Total Protein 6.5 - 8.1 g/dL 6.4   6.3   6.3    ?Total Bilirubin 0.3 - 1.2 mg/dL 0.5   0.5   0.4    ?Alkaline Phos 38 - 126 U/L 56   53   49    ?AST 15 - 41 U/L $Remo'31   27   25    'WchdC$ ?ALT 0 - 44 U/L $Remo'16   13   14    'zHYkk$ ? ?   ?Component Value Date/Time  ? RBC 2.67 (L) 01/29/2022 1128  ? MCV 101.1 (H) 01/29/2022 1128  ? MCH 32.6 01/29/2022 1128  ? MCHC 32.2 01/29/2022 1128  ? RDW 17.2 (H) 01/29/2022 1128  ? LYMPHSABS 54.7 (H) 01/29/2022 1128  ? MONOABS 0.6 01/29/2022 1128  ? EOSABS 0.0 01/29/2022 1128  ? BASOSABS 0.0 01/29/2022 1128  ? ? ?DIAGNOSTIC IMAGING:  ?I have independently reviewed the scans and discussed with the patient. ?NM PET Image Initial (PI) Skull Base To Thigh ? ?Result Date: 01/22/2022 ?CLINICAL DATA:  Initial treatment strategy for lymphocytosis. EXAM: NUCLEAR MEDICINE PET SKULL BASE TO THIGH TECHNIQUE: 8.3 mCi F-18 FDG was injected intravenously. Full-ring PET imaging was performed from the skull base to thigh after the radiotracer. CT data was obtained and used for attenuation correction and anatomic localization. Fasting blood glucose: 98 mg/dl COMPARISON:  None. FINDINGS: Mediastinal blood pool activity: SUV max 2.00 Liver activity: SUV max 3.5 NECK: No hypermetabolic lymph nodes in the neck. Incidental CT findings: none CHEST: Hypermetabolic bilateral supraclavicular lymphadenopathy. Index 1.6 cm short axis right supraclavicular node with SUV max = 5.0. Hypermetabolic mediastinal lymphadenopathy. Index 1.8 cm short axis  precarinal lymph node. SUV max = 5.3. Hypermetabolic subcarinal lymphadenopathy demonstrates SUV max = 4.8. Hypermetabolic lymphadenopathy seen in both axillary regions. Incidental CT findings: Heart size m

## 2022-02-03 ENCOUNTER — Ambulatory Visit: Payer: Medicare Other | Admitting: Physician Assistant

## 2022-02-04 ENCOUNTER — Other Ambulatory Visit: Payer: Self-pay | Admitting: Radiology

## 2022-02-04 ENCOUNTER — Ambulatory Visit (INDEPENDENT_AMBULATORY_CARE_PROVIDER_SITE_OTHER): Payer: Medicare Other | Admitting: Physician Assistant

## 2022-02-04 ENCOUNTER — Encounter: Payer: Self-pay | Admitting: *Deleted

## 2022-02-04 VITALS — BP 106/68 | HR 80 | Ht 64.0 in | Wt 160.0 lb

## 2022-02-04 DIAGNOSIS — R3914 Feeling of incomplete bladder emptying: Secondary | ICD-10-CM

## 2022-02-04 DIAGNOSIS — R339 Retention of urine, unspecified: Secondary | ICD-10-CM | POA: Diagnosis not present

## 2022-02-04 DIAGNOSIS — I700 Atherosclerosis of aorta: Secondary | ICD-10-CM | POA: Diagnosis not present

## 2022-02-04 DIAGNOSIS — N3000 Acute cystitis without hematuria: Secondary | ICD-10-CM | POA: Diagnosis not present

## 2022-02-04 DIAGNOSIS — C9110 Chronic lymphocytic leukemia of B-cell type not having achieved remission: Secondary | ICD-10-CM | POA: Diagnosis not present

## 2022-02-04 DIAGNOSIS — I10 Essential (primary) hypertension: Secondary | ICD-10-CM | POA: Diagnosis not present

## 2022-02-04 LAB — URINALYSIS, ROUTINE W REFLEX MICROSCOPIC
Bilirubin, UA: NEGATIVE
Glucose, UA: NEGATIVE
Ketones, UA: NEGATIVE
Nitrite, UA: POSITIVE — AB
Specific Gravity, UA: 1.015 (ref 1.005–1.030)
Urobilinogen, Ur: 0.2 mg/dL (ref 0.2–1.0)
pH, UA: 5.5 (ref 5.0–7.5)

## 2022-02-04 LAB — MICROSCOPIC EXAMINATION

## 2022-02-04 LAB — BLADDER SCAN AMB NON-IMAGING: Scan Result: 93

## 2022-02-04 MED ORDER — SULFAMETHOXAZOLE-TRIMETHOPRIM 800-160 MG PO TABS: 1.0000 | ORAL_TABLET | Freq: Two times a day (BID) | ORAL | 0 refills | Status: DC

## 2022-02-04 NOTE — Progress Notes (Signed)
? ?02/04/2022 ?1:32 PM  ? ?[name] ?1931/04/19 ?[id number] ? ? ?Assessment: ? ?1. Acute cystitis without hematuria   ?2. Incomplete emptying of bladder   ? ? ? ?Plan: ?Bactrim DS ordered for UTI and urine cx ordered. Will adjust tx if indicated by results. FU in 3-4 weeks for recheck UA and PVR. ? ?Chief Complaint: No chief complaint on file. ? ? ?Referring provider: Asencion Noble, MD ?[address] ?[place],  [place] [address] ? ? ?History of Present Illness: ? ?Faith Lee is a 88 y.o. year old female with a history of neurogenic bladder, chronic lymphocytic leukemia and stage III CKD who is seen in consultation from Asencion Noble, MD for follow up post ED evaluation on 01/29/22 for c/o inability to void x 24 hours. Pt was found to have 250 mL of urine on bladder scan and was able to void without difficulty during her ED course.  CT stone study indicated a distended bladder felt to be compatible with her history of neurogenic bladder syndrome, but no evidence of urinary lesion or obstruction. UA: 11-20WBC, rare bacteria, protein $RemoveBeforeDEI'30mg'PxmNkUGxyKdPlxPS$ /dL. No cx obtained and no tx indicated for infection. Medical records and results reviewed personally during pt's OV. She denies dysuria, burning, frequency, incontinence. No h/o UTIs ?UA= 6-10 WBCs, 0-2 RBCs, many bacteria, nitrite positive ?PVR=76ml ? ?01/29/22 ED note ?Patient states she is here because she has not been able to urinate for 24 hours.  Patient is followed by hematology oncology upstairs for chronic lymphocytic leukemia lymphocytosis and normocytic anemia.  Other significant past medical history is hypertension irritable bowel anxiety she has had abdominal hysterectomy.  Patient is on Lasix patient has chronic lower extremity swelling.  Patient without any other complaints. ?  ? ?Past Medical History:  ?Past Medical History:  ?Diagnosis Date  ? Anxiety   ? Hypertension   ? Irritable bowel disease   ? Osteoarthritis   ? ? ?Past Surgical History:  ?Past  Surgical History:  ?Procedure Laterality Date  ? ABDOMINAL HYSTERECTOMY  1974  ? CARDIAC CATHETERIZATION    ? 1991 and 2005  ? ? ?Allergies:  ?Allergies  ?Allergen Reactions  ? Lidocaine Other (See Comments) and Hypertension  ?  Patient had chest tightness with stomach pain, numbness on left side of limbs and unable to speak  ? ? ?Family History:  ?Family History  ?Problem Relation Age of Onset  ? Renal Disease Sister   ? Multiple myeloma Sister   ? ? ?Social History:  ?Social History  ? ?Tobacco Use  ? Smoking status: Never  ? Smokeless tobacco: Never  ?Vaping Use  ? Vaping Use: Never used  ?Substance Use Topics  ? Alcohol use: No  ? Drug use: No  ? ? ?Review of symptoms:  ?Constitutional:  Negative for night sweats, fever, chills ?ENT:  Negative for nose bleeds, sinus pain, painful swallowing ?CV:  Negative for chest pain ?Resp:  Negative for cough, wheezing, ?GI:  Negative for nausea, vomiting, diarrhea, bloody stools ?GU:  Positives noted in HPI; otherwise negative for gross hematuria, dysuria, urinary incontinence ?Neuro:  Negative for seizures, slurred speech ?Hematologic:  Negative for purpura, petechia, prolonged or excessive bleeding, use of anticoagulants  ? ?Physical Exam: ?BP 106/68   Pulse 80   Ht $R'5\' 4"'AU$  (1.626 m)   Wt 160 lb (72.6 kg)   BMI 27.46 kg/m?   ?Constitutional:  Alert and oriented, No acute distress. Pt in wheelchair and appears chronically ill ?HEENT: NCAT, moist mucus membranes.  Trachea midline, no masses. ?Cardiovascular: Regular rate and rhythm without murmur, rub, or gallops ?No clubbing, cyanosis, or edema. ?Respiratory: Normal respiratory effort, clear to auscultation bilaterally ?GI: Abdomen is soft, nontender, nondistended, no abdominal masses ?BACK:  Non-tender to palpation.  No CVAT ?Skin: No obvious rashes, warm, dry, intact ?Neurologic: Alert and oriented, no focal deficits ?Psychiatric: Appropriate. Normal mood and affect. ? ?Laboratory Data: ?No results found for this or  any previous visit (from the past 24 hour(s)). ? ?Lab Results  ?Component Value Date  ? WBC 61.5 (HH) 01/29/2022  ? HGB 8.7 (L) 01/29/2022  ? HCT 27.0 (L) 01/29/2022  ? MCV 101.1 (H) 01/29/2022  ? PLT 149 (L) 01/29/2022  ? ? ?Lab Results  ?Component Value Date  ? CREATININE 1.47 (H) 01/29/2022  ? ? ? ?Urinalysis ?   ?Component Value Date/Time  ? COLORURINE YELLOW 01/29/2022 1436  ? APPEARANCEUR HAZY (A) 01/29/2022 1436  ? LABSPEC 1.010 01/29/2022 1436  ? PHURINE 5.0 01/29/2022 1436  ? GLUCOSEU NEGATIVE 01/29/2022 1436  ? HGBUR SMALL (A) 01/29/2022 1436  ? [place] NEGATIVE 01/29/2022 1436  ? [place] NEGATIVE 01/29/2022 1436  ? PROTEINUR 30 (A) 01/29/2022 1436  ? UROBILINOGEN 0.2 08/01/2015 1144  ? NITRITE NEGATIVE 01/29/2022 1436  ? LEUKOCYTESUR TRACE (A) 01/29/2022 1436  ? ? ?Lab Results  ?Component Value Date  ? BACTERIA RARE (A) 01/29/2022  ? ? ?Pertinent Imaging: ?No results found for this or any previous visit. ? ?No results found for this or any previous visit. ? ? ? ? ?Summerlin, Berneice Heinrich, PA-C ?[facility] [place] ?  ?

## 2022-02-04 NOTE — Progress Notes (Unsigned)
Suttle, Faith Ashing, MD  Roosvelt Maser ?Approved for right supraclavicular lymph node biopsy.  Can be done same day.  ? ?Dylan   ?  ?   ?Previous Messages ?  ?----- Message -----  ?From: Roosvelt Maser  ?Sent: 02/03/2022  12:37 PM EDT  ?To: [facility] Procedure Requests  ?Subject: FW: CT Biopsy                                  ? ?I have not received at review for this bx?  ? ?----- Message -----  ?From: Roosvelt Maser  ?Sent: 02/01/2022   1:11 PM EDT  ?To: [facility] Procedure Requests  ?Subject: FW: CT Biopsy                                  ? ?MD Asking if this can be done on same day as her bone marrow biopsy?  ?----- Message -----  ?From: Roosvelt Maser  ?Sent: 02/01/2022  11:57 AM EDT  ?To: [facility] Procedure Requests  ?Subject: CT Biopsy                                      ? ? ?Procedure:  CT Biopsy  ? ?Reason: suspected lymphoma  ? ?History:  Pet and CT in computer  ? ?Patient is scheduled on 4/21 for BMBX but per the RN this is a different biopsy  ? ?Provider: Derek Jack  ?[phone] ?

## 2022-02-04 NOTE — Progress Notes (Signed)
post void residual =59m ?

## 2022-02-05 ENCOUNTER — Other Ambulatory Visit: Payer: Self-pay

## 2022-02-05 ENCOUNTER — Ambulatory Visit (HOSPITAL_COMMUNITY)
Admission: RE | Admit: 2022-02-05 | Discharge: 2022-02-05 | Disposition: A | Discharge status: Home / Self Care | Payer: Medicare Other | Source: Ambulatory Visit | Attending: Hematology | Admitting: Hematology

## 2022-02-05 ENCOUNTER — Encounter (HOSPITAL_COMMUNITY): Payer: Self-pay

## 2022-02-05 DIAGNOSIS — C8311 Mantle cell lymphoma, lymph nodes of head, face, and neck: Secondary | ICD-10-CM | POA: Diagnosis not present

## 2022-02-05 DIAGNOSIS — I10 Essential (primary) hypertension: Secondary | ICD-10-CM | POA: Insufficient documentation

## 2022-02-05 DIAGNOSIS — K589 Irritable bowel syndrome without diarrhea: Secondary | ICD-10-CM | POA: Insufficient documentation

## 2022-02-05 DIAGNOSIS — F419 Anxiety disorder, unspecified: Secondary | ICD-10-CM | POA: Diagnosis not present

## 2022-02-05 DIAGNOSIS — M1990 Unspecified osteoarthritis, unspecified site: Secondary | ICD-10-CM | POA: Insufficient documentation

## 2022-02-05 DIAGNOSIS — C8310 Mantle cell lymphoma, unspecified site: Secondary | ICD-10-CM | POA: Diagnosis not present

## 2022-02-05 DIAGNOSIS — D72820 Lymphocytosis (symptomatic): Secondary | ICD-10-CM | POA: Insufficient documentation

## 2022-02-05 DIAGNOSIS — C8319 Mantle cell lymphoma, extranodal and solid organ sites: Secondary | ICD-10-CM | POA: Diagnosis not present

## 2022-02-05 DIAGNOSIS — R590 Localized enlarged lymph nodes: Secondary | ICD-10-CM | POA: Diagnosis not present

## 2022-02-05 DIAGNOSIS — D500 Iron deficiency anemia secondary to blood loss (chronic): Secondary | ICD-10-CM | POA: Insufficient documentation

## 2022-02-05 DIAGNOSIS — C9110 Chronic lymphocytic leukemia of B-cell type not having achieved remission: Secondary | ICD-10-CM | POA: Diagnosis not present

## 2022-02-05 DIAGNOSIS — D696 Thrombocytopenia, unspecified: Secondary | ICD-10-CM | POA: Diagnosis not present

## 2022-02-05 LAB — CBC WITH DIFFERENTIAL/PLATELET
Abs Immature Granulocytes: 0.11 10*3/uL — ABNORMAL HIGH (ref 0.00–0.07)
Basophils Absolute: 0.1 10*3/uL (ref 0.0–0.1)
Basophils Relative: 0 %
Eosinophils Absolute: 0.5 10*3/uL (ref 0.0–0.5)
Eosinophils Relative: 1 %
HCT: 22.7 % — ABNORMAL LOW (ref 36.0–46.0)
Hemoglobin: 7.5 g/dL — ABNORMAL LOW (ref 12.0–15.0)
Immature Granulocytes: 0 %
Lymphocytes Relative: 80 %
Lymphs Abs: 43.4 10*3/uL — ABNORMAL HIGH (ref 0.7–4.0)
MCH: 32.9 pg (ref 26.0–34.0)
MCHC: 33 g/dL (ref 30.0–36.0)
MCV: 99.6 fL (ref 80.0–100.0)
Monocytes Absolute: 5.9 10*3/uL — ABNORMAL HIGH (ref 0.1–1.0)
Monocytes Relative: 11 %
Neutro Abs: 4.3 10*3/uL (ref 1.7–7.7)
Neutrophils Relative %: 8 %
Platelets: 134 10*3/uL — ABNORMAL LOW (ref 150–400)
RBC: 2.28 MIL/uL — ABNORMAL LOW (ref 3.87–5.11)
RDW: 18.1 % — ABNORMAL HIGH (ref 11.5–15.5)
WBC: 54.2 10*3/uL (ref 4.0–10.5)
nRBC: 0.1 % (ref 0.0–0.2)

## 2022-02-05 MED ORDER — MIDAZOLAM HCL 2 MG/2ML IJ SOLN
INTRAMUSCULAR | Status: AC | PRN
  Administered 2022-02-05: .5 mg via INTRAVENOUS

## 2022-02-05 MED ORDER — MIDAZOLAM HCL 2 MG/2ML IJ SOLN
INTRAMUSCULAR | Status: AC
  Filled 2022-02-05: qty 2

## 2022-02-05 MED ORDER — FENTANYL CITRATE (PF) 100 MCG/2ML IJ SOLN
INTRAMUSCULAR | Status: AC
  Filled 2022-02-05: qty 2

## 2022-02-05 MED ORDER — CHLOROPROCAINE HCL 1 % IJ SOLN
INTRAMUSCULAR | Status: AC | PRN
  Administered 2022-02-05: 5 mL

## 2022-02-05 MED ORDER — NALOXONE HCL 0.4 MG/ML IJ SOLN
INTRAMUSCULAR | Status: AC
  Filled 2022-02-05: qty 1

## 2022-02-05 MED ORDER — CHLOROPROCAINE HCL 1 % IJ SOLN
INTRAMUSCULAR | Status: AC
  Filled 2022-02-05: qty 30

## 2022-02-05 MED ORDER — FLUMAZENIL 0.5 MG/5ML IV SOLN
INTRAVENOUS | Status: DC
Start: 2022-02-05 — End: 2022-02-05
  Filled 2022-02-05: qty 5

## 2022-02-05 MED ORDER — SODIUM CHLORIDE 0.9 % IV SOLN: INTRAVENOUS | Status: DC

## 2022-02-05 NOTE — Consult Note (Signed)
? ?Chief Complaint: ?Patient was seen in consultation today for image guided bone marrow biopsy and right supraclavicular lymph node biopsy ? ?Referring Physician(s): ?Derek Jack ? ?Supervising Physician: Sandi Mariscal ? ?Patient Status: [place] ? ?History of Present Illness: ?Faith Lee is an 88 y.o. female with past medical history of anxiety, hypertension, irritable bowel disease, neurogenic bladder dysfunction, osteoarthritis CLL, lymphocytosis and normocytic anemia.  Flow cytometry in July of last year showed monoclonal population of B cells with expression of CD5 and no significant CD20 expression.  There were small to medium sized lymphocytes with coarse chromatin and frequent prominent nucleoli with differential including atypical CLL versus mantle cell lymphoma.  Recent PET scan also revealed marked splenomegaly with diffuse splenic hypermetabolism and hypermetabolic supraclavicular, mediastinal, bilateral hilar and abdominal lymphadenopathy.  She presents today for image guided bone marrow biopsy and right supraclavicular lymph node biopsy for further evaluation. ? ?Past Medical History:  ?Diagnosis Date  ? Anxiety   ? Hypertension   ? Irritable bowel disease   ? Osteoarthritis   ? ? ?Past Surgical History:  ?Procedure Laterality Date  ? ABDOMINAL HYSTERECTOMY  1974  ? CARDIAC CATHETERIZATION    ? 1991 and 2005  ? ? ?Allergies: ?Lidocaine ? ?Medications: ?Prior to Admission medications   ?Medication Sig Start Date End Date Taking? Authorizing Provider  ?acetaminophen (TYLENOL) 500 MG tablet Take 1,000 mg by mouth every 6 (six) hours as needed for moderate pain.    [provider]  ?allopurinol (ZYLOPRIM) 300 MG tablet Take 1 tablet (300 mg total) by mouth daily. 01/11/22   Derek Jack, MD  ?furosemide (LASIX) 20 MG tablet Take 20 mg by mouth daily. 05/02/21   [provider]  ?LORazepam (ATIVAN) 0.5 MG tablet Take 0.5 mg by mouth 2 (two) times daily.  01/26/21   [provider]  ?metoprolol tartrate (LOPRESSOR) 25 MG tablet Take 25 mg by mouth 2 (two) times daily. 05/02/21   [provider]  ?mirtazapine (REMERON) 7.5 MG tablet Take 7.5 mg by mouth at bedtime. 12/14/21   [provider]  ?olmesartan-hydrochlorothiazide (BENICAR HCT) 40-25 MG tablet Take 1 tablet by mouth daily. 05/29/21   [provider]  ?potassium chloride (MICRO-K) 10 MEQ CR capsule Take 10 mEq by mouth daily. 05/02/21   [provider]  ?sulfamethoxazole-trimethoprim (BACTRIM DS) 800-160 MG tablet Take 1 tablet by mouth 2 (two) times daily. 02/04/22   Summerlin, Berneice Heinrich, PA-C  ?  ? ?Family History  ?Problem Relation Age of Onset  ? Renal Disease Sister   ? Multiple myeloma Sister   ? ? ?Social History  ? ?Socioeconomic History  ? Marital status: Widowed  ?  Spouse name: Not on file  ? Number of children: Not on file  ? Years of education: Not on file  ? Highest education level: Not on file  ?Occupational History  ? Not on file  ?Tobacco Use  ? Smoking status: Never  ? Smokeless tobacco: Never  ?Vaping Use  ? Vaping Use: Never used  ?Substance and Sexual Activity  ? Alcohol use: No  ? Drug use: No  ? Sexual activity: Not on file  ?Other Topics Concern  ? Not on file  ?Social History Narrative  ? Not on file  ? ?Social Determinants of Health  ? ?Financial Resource Strain: Low Risk   ? Difficulty of Paying Living Expenses: Not very hard  ?Food Insecurity: No Food Insecurity  ? Worried About Charity fundraiser in the Last  Year: Never true  ? Ran Out of Food in the Last Year: Never true  ?Transportation Needs: No Transportation Needs  ? Lack of Transportation (Medical): No  ? Lack of Transportation (Non-Medical): No  ?Physical Activity: Insufficiently Active  ? Days of Exercise per Week: 2 days  ? Minutes of Exercise per Session: 10 min  ?Stress: Stress Concern Present  ? Feeling of Stress : To some extent  ?Social Connections: Moderately Integrated   ? Frequency of Communication with Friends and Family: More than three times a week  ? Frequency of Social Gatherings with Friends and Family: Twice a week  ? Attends Religious Services: More than 4 times per year  ? Active Member of Clubs or Organizations: Yes  ? Attends Archivist Meetings: 1 to 4 times per year  ? Marital Status: Widowed  ? ? ? ? ?Review of Systems currently denies fever,HA,CP,worsening dyspnea, cough, abd/back pain,N/V or bleeding ? ?Vital Signs: ?Vitals:  ? 02/05/22 0909  ?BP: (!) 103/51  ?Pulse: 88  ?Resp: 16  ?Temp: 98.2 ?F (36.8 ?C)  ?SpO2: 93%  ? ? ? ? ?Physical Exam awake/alert; chest-clear to auscultation bilaterally.  Heart with regular rate and rhythm.  Abdomen soft, positive bowel sounds, nontender.  Bilateral lower extremity edema noted ? ?Imaging: ?NM PET Image Initial (PI) Skull Base To Thigh ? ?Result Date: 01/22/2022 ?CLINICAL DATA:  Initial treatment strategy for lymphocytosis. EXAM: NUCLEAR MEDICINE PET SKULL BASE TO THIGH TECHNIQUE: 8.3 mCi F-18 FDG was injected intravenously. Full-ring PET imaging was performed from the skull base to thigh after the radiotracer. CT data was obtained and used for attenuation correction and anatomic localization. Fasting blood glucose: 98 mg/dl COMPARISON:  None. FINDINGS: Mediastinal blood pool activity: SUV max 2.00 Liver activity: SUV max 3.5 NECK: No hypermetabolic lymph nodes in the neck. Incidental CT findings: none CHEST: Hypermetabolic bilateral supraclavicular lymphadenopathy. Index 1.6 cm short axis right supraclavicular node with SUV max = 5.0. Hypermetabolic mediastinal lymphadenopathy. Index 1.8 cm short axis precarinal lymph node. SUV max = 5.3. Hypermetabolic subcarinal lymphadenopathy demonstrates SUV max = 4.8. Hypermetabolic lymphadenopathy seen in both axillary regions. Incidental CT findings: Heart size mildly enlarged. No pericardial effusion. Mild atherosclerotic calcification is noted in the wall of the thoracic  aorta. Tiny left pleural effusion no suspicious pulmonary nodule or mass. No focal consolidation. ABDOMEN/PELVIS: Spleen is markedly enlarged measuring 20 x 15.3 x 9.6 cm (1734 cc estimated volume). Spleen shows diffuse hypermetabolism with SUV max = 6.8. Hypermetabolic lymphadenopathy identified in the hepatoduodenal ligament and retroperitoneal space of the abdomen. 1.5 cm precaval node on 149/3 demonstrates SUV max = 4.9. No hypermetabolic lymphadenopathy in the pelvis. Incidental CT findings: Calcified gallstones evident. There is mild atherosclerotic calcification of the abdominal aorta without aneurysm. Splenomegaly displaces intraperitoneal and retroperitoneal anatomy to the right. SKELETON: No focal hypermetabolic activity to suggest skeletal metastasis. Incidental CT findings: No worrisome lytic or sclerotic osseous abnormality. IMPRESSION: 1. Marked splenomegaly with diffuse splenic hypermetabolism ([place] 4). 2. Hypermetabolic supraclavicular, mediastinal, bilateral hilar, and abdominal lymphadenopathy ([place] 4). 3. Cholelithiasis. 4.  Aortic Atherosclerois (ICD10-170.0) Electronically Signed   By: Misty Stanley M.D.   On: 01/22/2022 09:25  ? ?CT Renal Stone Study ? ?Result Date: 01/29/2022 ?CLINICAL DATA:  Neurogenic bladder dysfunction EXAM: CT ABDOMEN AND PELVIS WITHOUT CONTRAST TECHNIQUE: Multidetector CT imaging of the abdomen and pelvis was performed following the standard protocol without IV contrast. RADIATION DOSE REDUCTION: This exam was performed according to the departmental dose-optimization program which includes  automated exposure control, adjustment of the mA and/or kV according to patient size and/or use of iterative reconstruction technique. COMPARISON:  PET-CT dated January 21, 2022 FINDINGS: Lower chest: Bibasilar atelectasis. Hepatobiliary: No suspicious liver lesions. Cholelithiasis. No gallbladder wall thickening. Limited evaluation for biliary ductal dilation due to lack of IV  contrast. Pancreas: Unremarkable. No pancreatic ductal dilatation or surrounding inflammatory changes. Spleen: Marked splenomegaly, unchanged compared with prior PET-CT. Adrenals/Urinary Tract: Medial displac

## 2022-02-05 NOTE — Sedation Documentation (Signed)
Lymph node bx complete. Patient being prepped for bone marrow bx. ?

## 2022-02-05 NOTE — Procedures (Signed)
Pre-procedure Diagnosis: CLL ?Post-procedure Diagnosis: Same ? ?Technically successful CT guided bone marrow aspiration and biopsy of left iliac crest.  ?Post US guided biopsy of hypermetabolic right supraclavicular LN Bx. ? ?Complications: None Immediate ? ?EBL: None ? ?Signed: ?Sandi Mariscal ?Pager: [phone] ?02/05/2022, 11:36 AM ? ? ?

## 2022-02-05 NOTE — Progress Notes (Signed)
Called and updated IR / Radiology CT of critical lab values. HGB 7.5 and WBC 54.2.   Both values similar to earlier results on 4/14- MD aware. ?

## 2022-02-05 NOTE — Discharge Instructions (Signed)
For questions /concerns may call Interventional Radiology at [phone] ? ?You may remove your dressing and shower tomorrow afternoon ? ?Moderate Conscious Sedation, Adult, Care After ?This sheet gives you information about how to care for yourself after your procedure. Your health care provider may also give you more specific instructions. If you have problems or questions, contact your health careprovider. ?What can I expect after the procedure? ?After the procedure, it is common to have: ?Sleepiness for several hours. ?Impaired judgment for several hours. ?Difficulty with balance. ?Vomiting if you eat too soon. ?Follow these instructions at home: ?For the time period you were told by your health care provider: ?Rest. ?Do not participate in activities where you could fall or become injured. ?Do not drive or use machinery. ?Do not drink alcohol. ?Do not take sleeping pills or medicines that cause drowsiness. ?Do not make important decisions or sign legal documents. ?Do not take care of children on your own. ?Eating and drinking ? ?Follow the diet recommended by your health care provider. ?Drink enough fluid to keep your urine pale yellow. ?If you vomit: ?Drink water, juice, or soup when you can drink without vomiting. ?Make sure you have little or no nausea before eating solid foods. ? ?General instructions ?Take over-the-counter and prescription medicines only as told by your health care provider. ?Have a responsible adult stay with you for the time you are told. It is important to have someone help care for you until you are awake and alert. ?Do not smoke. ?Keep all follow-up visits as told by your health care provider. This is important. ?Contact a health care provider if: ?You are still sleepy or having trouble with balance after 24 hours. ?You feel light-headed. ?You keep feeling nauseous or you keep vomiting. ?You develop a rash. ?You have a fever. ?You have redness or swelling around the IV site. ?Get help  right away if: ?You have trouble breathing. ?You have new-onset confusion at home. ?Summary ?After the procedure, it is common to feel sleepy, have impaired judgment, or feel nauseous if you eat too soon. ?Rest after you get home. Know the things you should not do after the procedure. ?Follow the diet recommended by your health care provider and drink enough fluid to keep your urine pale yellow. ?Get help right away if you have trouble breathing or new-onset confusion at home. ?This information is not intended to replace advice given to you by your health care provider. Make sure you discuss any questions you have with your healthcare provider. ? ? ?Bone Marrow Aspiration and Bone Marrow Biopsy, Adult, Care After ?This sheet gives you information about how to care for yourself after your procedure. Your health care provider may also give you more specific instructions. If you have problems or questions, contact your health careprovider. ?What can I expect after the procedure? ?After the procedure, it is common to have: ?Mild pain and tenderness. ?Swelling. ?Bruising. ?Follow these instructions at home: ?Puncture site care ?Follow instructions from your health care provider about how to take care of the puncture site. Make sure you: ?Wash your hands with soap and water before and after you change your bandage (dressing). If soap and water are not available, use hand sanitizer. ?Change your dressing as told by your health care provider. ?Check your puncture site every day for signs of infection. Check for: ?More redness, swelling, or pain. ?Fluid or blood. ?Warmth. ?Pus or a bad smell. ? ?Activity ?Return to your normal activities as told by your health care  provider. Ask your health care provider what activities are safe for you. ?Do not lift anything that is heavier than 10 lb (4.5 kg), or the limit that you are told, until your health care provider says that it is safe. ?Do not drive for 24 hours if you were given  a sedative during your procedure. ?General instructions ?Take over-the-counter and prescription medicines only as told by your health care provider. ?Do not take baths, swim, or use a hot tub until your health care provider approves. Ask your health care provider if you may take showers. You may only be allowed to take sponge baths. ?If directed, put ice on the affected area. To do this: ?Put ice in a plastic bag. ?Place a towel between your skin and the bag. ?Leave the ice on for 20 minutes, 2-3 times a day. ?Keep all follow-up visits as told by your health care provider. This is important. ? ?Contact a health care provider if: ?Your pain is not controlled with medicine. ?You have a fever. ?You have more redness, swelling, or pain around the puncture site. ?You have fluid or blood coming from the puncture site. ?Your puncture site feels warm to the touch. ?You have pus or a bad smell coming from the puncture site. ?Summary ?After the procedure, it is common to have mild pain, tenderness, swelling, and bruising. ?Follow instructions from your health care provider about how to take care of the puncture site and what activities are safe for you. ?Take over-the-counter and prescription medicines only as told by your health care provider. ?Contact a health care provider if you have any signs of infection, such as fluid or blood coming from the puncture site. ?This information is not intended to replace advice given to you by your health care provider. Make sure you discuss any questions you have with your healthcare provider. ? ? ?Needle Biopsy, Care After ?This sheet gives you information about how to care for yourself after your procedure. Your health care provider may also give you more specific instructions. If you have problems or questions, contact your health careprovider. ?What can I expect after the procedure? ?After the procedure, it is common to have soreness, bruising, or mild pain atthe puncture site. This  should go away in a few days. ?Follow these instructions at home: ?Needle insertion site care ?Wash your hands with soap and water before you change your bandage (dressing). If you cannot use soap and water, use hand sanitizer. ?Follow instructions from your health care provider about how to take care of your puncture site. This includes: ?When and how to change your dressing. ?When to remove your dressing. ?Check your puncture site every day for signs of infection. Check for: ?Redness, swelling, or pain. ?Fluid or blood. ?Pus or a bad smell. ?Warmth. ?  ?General instructions ?Return to your normal activities as told by your health care provider. Ask your health care provider what activities are safe for you. ?Do not take baths, swim, or use a hot tub until your health care provider approves. Ask your health care provider if you may take showers. You may only be allowed to take sponge baths. ?Take over-the-counter and prescription medicines only as told by your health care provider. ?Keep all follow-up visits as told by your health care provider. This is important. ?Contact a health care provider if: ?You have a fever. ?You have redness, swelling, or pain at the puncture site that lasts longer than a few days. ?You have fluid, blood, or pus  coming from your puncture site. ?Your puncture site feels warm to the touch. ?Get help right away if: ?You have severe bleeding from the puncture site. ?Summary ?After the procedure, it is common to have soreness, bruising, or mild pain at the puncture site. This should go away in a few days. ?Check your puncture site every day for signs of infection, such as redness, swelling, or pain. ?Get help right away if you have severe bleeding from your puncture site. ?This information is not intended to replace advice given to you by your health care provider. Make sure you discuss any questions you have with your healthcare provider. ?  ?  ? ?

## 2022-02-08 ENCOUNTER — Telehealth: Payer: Self-pay

## 2022-02-08 LAB — URINE CULTURE

## 2022-02-08 LAB — SURGICAL PATHOLOGY

## 2022-02-08 NOTE — Telephone Encounter (Signed)
No answer left voicemail for patient to call back. ?

## 2022-02-08 NOTE — Telephone Encounter (Signed)
-----   Message from Reynaldo Minium, [place] sent at 02/08/2022 11:09 AM EDT ----- ?Please let pt know her urine dx indicates Bactrim covers the UTI she has. She needs to complete the antibx and fu in 3-4 weeks if she is having any urinary concerns ?----- Message ----- ?From: Interface, [facility] Lab Results In ?Sent: 02/04/2022   3:45 PM EDT ?To: Berneice Heinrich [place], PA-C ? ? ?

## 2022-02-09 LAB — SURGICAL PATHOLOGY

## 2022-02-09 NOTE — Telephone Encounter (Signed)
Tried to call patient again, no answer.  Sent mychart message informing her.  ?

## 2022-02-16 ENCOUNTER — Other Ambulatory Visit (HOSPITAL_COMMUNITY): Payer: Self-pay | Admitting: *Deleted

## 2022-02-16 ENCOUNTER — Encounter (HOSPITAL_COMMUNITY): Payer: Self-pay | Admitting: Hematology

## 2022-02-16 ENCOUNTER — Inpatient Hospital Stay (HOSPITAL_COMMUNITY): Payer: Medicare Other | Attending: Hematology | Admitting: Hematology

## 2022-02-16 ENCOUNTER — Other Ambulatory Visit (HOSPITAL_COMMUNITY): Payer: Self-pay

## 2022-02-16 VITALS — BP 112/53 | HR 72 | Temp 98.4°F | Resp 16

## 2022-02-16 DIAGNOSIS — D649 Anemia, unspecified: Secondary | ICD-10-CM | POA: Insufficient documentation

## 2022-02-16 DIAGNOSIS — I10 Essential (primary) hypertension: Secondary | ICD-10-CM | POA: Insufficient documentation

## 2022-02-16 DIAGNOSIS — Z5111 Encounter for antineoplastic chemotherapy: Secondary | ICD-10-CM | POA: Insufficient documentation

## 2022-02-16 DIAGNOSIS — R61 Generalized hyperhidrosis: Secondary | ICD-10-CM | POA: Diagnosis not present

## 2022-02-16 DIAGNOSIS — K5900 Constipation, unspecified: Secondary | ICD-10-CM | POA: Insufficient documentation

## 2022-02-16 DIAGNOSIS — R634 Abnormal weight loss: Secondary | ICD-10-CM | POA: Diagnosis not present

## 2022-02-16 DIAGNOSIS — Z9221 Personal history of antineoplastic chemotherapy: Secondary | ICD-10-CM | POA: Diagnosis not present

## 2022-02-16 DIAGNOSIS — R197 Diarrhea, unspecified: Secondary | ICD-10-CM

## 2022-02-16 DIAGNOSIS — C9110 Chronic lymphocytic leukemia of B-cell type not having achieved remission: Secondary | ICD-10-CM | POA: Diagnosis not present

## 2022-02-16 DIAGNOSIS — N390 Urinary tract infection, site not specified: Secondary | ICD-10-CM | POA: Diagnosis not present

## 2022-02-16 DIAGNOSIS — D72820 Lymphocytosis (symptomatic): Secondary | ICD-10-CM | POA: Diagnosis not present

## 2022-02-16 DIAGNOSIS — Z79899 Other long term (current) drug therapy: Secondary | ICD-10-CM | POA: Insufficient documentation

## 2022-02-16 DIAGNOSIS — M1990 Unspecified osteoarthritis, unspecified site: Secondary | ICD-10-CM | POA: Diagnosis not present

## 2022-02-16 DIAGNOSIS — Z806 Family history of leukemia: Secondary | ICD-10-CM | POA: Insufficient documentation

## 2022-02-16 DIAGNOSIS — R630 Anorexia: Secondary | ICD-10-CM | POA: Insufficient documentation

## 2022-02-16 DIAGNOSIS — M7989 Other specified soft tissue disorders: Secondary | ICD-10-CM | POA: Insufficient documentation

## 2022-02-16 DIAGNOSIS — C8318 Mantle cell lymphoma, lymph nodes of multiple sites: Secondary | ICD-10-CM

## 2022-02-16 DIAGNOSIS — R5383 Other fatigue: Secondary | ICD-10-CM | POA: Diagnosis not present

## 2022-02-16 DIAGNOSIS — C8310 Mantle cell lymphoma, unspecified site: Secondary | ICD-10-CM | POA: Insufficient documentation

## 2022-02-16 DIAGNOSIS — D500 Iron deficiency anemia secondary to blood loss (chronic): Secondary | ICD-10-CM

## 2022-02-16 LAB — C DIFFICILE QUICK SCREEN W PCR REFLEX
C Diff antigen: NEGATIVE
C Diff interpretation: NOT DETECTED
C Diff toxin: NEGATIVE

## 2022-02-16 NOTE — Progress Notes (Signed)
? ?[facility] ?[address] ?[place], [place] [address] ? ? ?CLINIC:  ?Medical Oncology/Hematology ? ?PCP:  ?Asencion Noble, MD ?[address] / [place] [address]  ?[phone] ? ?REASON FOR VISIT:  ?Stage IVB mantle cell lymphoma ? ?PRIOR THERAPY: none ? ?CURRENT THERAPY: Intermittent IV Iron (Venofer) ? ?INTERVAL HISTORY:  ?Faith Lee, a 88 y.o. female, returns for routine follow-up for her B-cell lymphoproliferative disorder and normocytic anemia. Faith Lee was last seen on 02/01/2022. ? ?Today she reports feeling well. She reports fatigue and night sweats. Her sleep is poor, and she spends most of the day sitting or laying down. She reports severe diarrhea since 4/28. She took Bactrim starting on 4/20 BID for 7 days for a UTI. She is able to stand short period of time with a walker. She is eating small portions, and her taste is poor.  ? ?REVIEW OF SYSTEMS:  ?Review of Systems  ?Constitutional:  Positive for appetite change and fatigue.  ?HENT:   Positive for trouble swallowing.   ?Gastrointestinal:  Positive for diarrhea and nausea.  ?Endocrine: Positive for hot flashes (night sweats).  ?Musculoskeletal:  Positive for arthralgias (4/10 legs).  ?Neurological:  Positive for dizziness, headaches and numbness.  ?Psychiatric/Behavioral:  Positive for depression and sleep disturbance. The patient is nervous/anxious.   ?All other systems reviewed and are negative. ? ?PAST MEDICAL/SURGICAL HISTORY:  ?Past Medical History:  ?Diagnosis Date  ? Anxiety   ? Hypertension   ? Irritable bowel disease   ? Osteoarthritis   ? ?Past Surgical History:  ?Procedure Laterality Date  ? ABDOMINAL HYSTERECTOMY  1974  ? CARDIAC CATHETERIZATION    ? 1991 and 2005  ? ? ?SOCIAL HISTORY:  ?Social History  ? ?Socioeconomic History  ? Marital status: Widowed  ?  Spouse name: Not on file  ? Number of children: Not on file  ? Years of education: Not on file  ? Highest education level: Not on file  ?Occupational  History  ? Not on file  ?Tobacco Use  ? Smoking status: Never  ? Smokeless tobacco: Never  ?Vaping Use  ? Vaping Use: Never used  ?Substance and Sexual Activity  ? Alcohol use: No  ? Drug use: No  ? Sexual activity: Not on file  ?Other Topics Concern  ? Not on file  ?Social History Narrative  ? Not on file  ? ?Social Determinants of Health  ? ?Financial Resource Strain: Low Risk   ? Difficulty of Paying Living Expenses: Not very hard  ?Food Insecurity: No Food Insecurity  ? Worried About Charity fundraiser in the Last Year: Never true  ? Ran Out of Food in the Last Year: Never true  ?Transportation Needs: No Transportation Needs  ? Lack of Transportation (Medical): No  ? Lack of Transportation (Non-Medical): No  ?Physical Activity: Insufficiently Active  ? Days of Exercise per Week: 2 days  ? Minutes of Exercise per Session: 10 min  ?Stress: Stress Concern Present  ? Feeling of Stress : To some extent  ?Social Connections: Moderately Integrated  ? Frequency of Communication with Friends and Family: More than three times a week  ? Frequency of Social Gatherings with Friends and Family: Twice a week  ? Attends Religious Services: More than 4 times per year  ? Active Member of Clubs or Organizations: Yes  ? Attends Archivist Meetings: 1 to 4 times per year  ? Marital Status: Widowed  ?Intimate Partner Violence: Not At  Risk  ? Fear of Current or Ex-Partner: No  ? Emotionally Abused: No  ? Physically Abused: No  ? Sexually Abused: No  ? ? ?FAMILY HISTORY:  ?Family History  ?Problem Relation Age of Onset  ? Renal Disease Sister   ? Multiple myeloma Sister   ? ? ?CURRENT MEDICATIONS:  ?Current Outpatient Medications  ?Medication Sig Dispense Refill  ? acetaminophen (TYLENOL) 500 MG tablet Take 1,000 mg by mouth every 6 (six) hours as needed for moderate pain.    ? allopurinol (ZYLOPRIM) 300 MG tablet Take 1 tablet (300 mg total) by mouth daily. 30 tablet 6  ? furosemide (LASIX) 20 MG tablet Take 20 mg by mouth  daily.    ? LORazepam (ATIVAN) 0.5 MG tablet Take 0.5 mg by mouth 2 (two) times daily.    ? metoprolol tartrate (LOPRESSOR) 25 MG tablet Take 25 mg by mouth 2 (two) times daily.    ? mirtazapine (REMERON) 7.5 MG tablet Take 7.5 mg by mouth at bedtime.    ? olmesartan-hydrochlorothiazide (BENICAR HCT) 40-25 MG tablet Take 1 tablet by mouth daily.    ? potassium chloride (MICRO-K) 10 MEQ CR capsule Take 10 mEq by mouth daily.    ? sulfamethoxazole-trimethoprim (BACTRIM DS) 800-160 MG tablet Take 1 tablet by mouth 2 (two) times daily. 14 tablet 0  ? ?No current facility-administered medications for this visit.  ? ? ?ALLERGIES:  ?Allergies  ?Allergen Reactions  ? Lidocaine Other (See Comments) and Hypertension  ?  Patient had chest tightness with stomach pain, numbness on left side of limbs and unable to speak  ? ? ?PHYSICAL EXAM:  ?Performance status (ECOG): 1 - Symptomatic but completely ambulatory ? ?Vitals:  ? 02/16/22 0916  ?BP: (!) 112/53  ?Pulse: 72  ?Resp: 16  ?Temp: 98.4 ?F (36.9 ?C)  ?SpO2: 100%  ? ?Wt Readings from Last 3 Encounters:  ?02/05/22 164 lb (74.4 kg)  ?02/04/22 160 lb (72.6 kg)  ?02/01/22 160 lb 4.8 oz (72.7 kg)  ? ?Physical Exam ?Vitals reviewed.  ?Constitutional:   ?   Appearance: Normal appearance.  ?   Comments: In wheelchair  ?Cardiovascular:  ?   Rate and Rhythm: Normal rate and regular rhythm.  ?   Pulses: Normal pulses.  ?   Heart sounds: Normal heart sounds.  ?Pulmonary:  ?   Effort: Pulmonary effort is normal.  ?   Breath sounds: Normal breath sounds.  ?Musculoskeletal:  ?   Right lower leg: 2+ Edema present.  ?   Left lower leg: 2+ Edema present.  ?Neurological:  ?   General: No focal deficit present.  ?   Mental Status: She is alert and oriented to person, place, and time.  ?Psychiatric:     ?   Mood and Affect: Mood normal.     ?   Behavior: Behavior normal.  ? ? ?LABORATORY DATA:  ?I have reviewed the labs as listed.  ? ?  Latest Ref Rng & Units 02/05/2022  ?  9:15 AM 01/29/2022  ?  11:28 AM 01/11/2022  ?  9:42 AM  ?CBC  ?WBC 4.0 - 10.5 K/uL 54.2   61.5   79.7    ?Hemoglobin 12.0 - 15.0 g/dL 7.5   8.7   8.6    ?Hematocrit 36.0 - 46.0 % 22.7   27.0   27.2    ?Platelets 150 - 400 K/uL 134   149   153    ? ? ?  Latest Ref Rng & Units  01/29/2022  ? 11:28 AM 01/11/2022  ?  9:42 AM 06/29/2021  ?  2:03 PM  ?CMP  ?Glucose 70 - 99 mg/dL 100   142   129    ?BUN 8 - 23 mg/dL 32   36   21    ?Creatinine 0.44 - 1.00 mg/dL 1.47   1.33   0.99    ?Sodium 135 - 145 mmol/L 138   138   131    ?Potassium 3.5 - 5.1 mmol/L 4.0   4.2   4.3    ?Chloride 98 - 111 mmol/L 102   104   95    ?CO2 22 - 32 mmol/L _0 ?Calcium 8.9 - 10.3 mg/dL 8.8   8.5   8.4    ?Total Protein 6.5 - 8.1 g/dL 6.4   6.3   6.3    ?Total Bilirubin 0.3 - 1.2 mg/dL 0.5   0.5   0.4    ?Alkaline Phos 38 - 126 U/L 56   53   49    ?AST 15 - 41 U/L _1 ?ALT 0 - 44 U/L _2 ? ?   ?Component Value Date/Time  ? RBC 2.28 (L) 02/05/2022 0915  ? MCV 99.6 02/05/2022 0915  ? MCH 32.9 02/05/2022 0915  ? MCHC 33.0 02/05/2022 0915  ? RDW 18.1 (H) 02/05/2022 0915  ? LYMPHSABS 43.4 (H) 02/05/2022 0915  ? MONOABS 5.9 (H) 02/05/2022 0915  ? EOSABS 0.5 02/05/2022 0915  ? BASOSABS 0.1 02/05/2022 0915  ? ? ?DIAGNOSTIC IMAGING:  ?I have independently reviewed the scans and discussed with the patient. ?NM PET Image Initial (PI) Skull Base To Thigh ? ?Result Date: 01/22/2022 ?CLINICAL DATA:  Initial treatment strategy for lymphocytosis. EXAM: NUCLEAR MEDICINE PET SKULL BASE TO THIGH TECHNIQUE: 8.3 mCi F-18 FDG was injected intravenously. Full-ring PET imaging was performed from the skull base to thigh after the radiotracer. CT data was obtained and used for attenuation correction and anatomic localization. Fasting blood glucose: 98 mg/dl COMPARISON:  None. FINDINGS: Mediastinal blood pool activity: SUV max 2.00 Liver activity: SUV max 3.5 NECK: No hypermetabolic lymph nodes in the neck. Incidental CT findings: none CHEST: Hypermetabolic  bilateral supraclavicular lymphadenopathy. Index 1.6 cm short axis right supraclavicular node with SUV max = 5.0. Hypermetabolic mediastinal lymphadenopathy. Index 1.8 cm short axis precarinal lymph node. SUV ma

## 2022-02-16 NOTE — Progress Notes (Signed)
START ON PATHWAY REGIMEN - Lymphoma and CLL ? ? ?  A cycle is every 28 days: ?    Rituximab-xxxx  ?    Bendamustine  ? ?**Always confirm dose/schedule in your pharmacy ordering system** ? ?Patient Characteristics: ?Mantle Cell Lymphoma, First Line, Aggressive Disease or Treatment Indicated, Stage II - IV, Not a Transplant Candidate ?Disease Type: Not Applicable ?Disease Type: Mantle Cell Lymphoma ?Disease Type: Not Applicable ?Line of Therapy: First Line ?Was TP53 Mutation Testing Completed<= Yes, TP53 Mutation Positive ?Patient Characteristics: Not a Transplant Candidate ?Intent of Therapy: ?Non-Curative / Palliative Intent, Discussed with Patient ?

## 2022-02-16 NOTE — Addendum Note (Signed)
Addended by: Joie Bimler on: 02/16/2022 03:09 PM ? ? Modules accepted: Orders ? ?

## 2022-02-16 NOTE — Patient Instructions (Signed)
[facility] at [facility] ?Discharge Instructions ? ? ?You were seen and examined today by Dr. Delton Coombes. ? ?He reviewed the results of your bone marrow biopsy and lymph node biopsy. It shows mantle cell lymphoma. This lymphoma is causing your blood to be low and is causing your symptoms. Since this is the case, we should start you on treatment for the lymphoma. ? ?The two drugs we would give to treat this are called Rituxan and Bendamustine. You would come to the clinic two days in a row for treatment. You would do this every 4 weeks.  ? ?This will require you to have a port a cath placed. The first treatment we will give through a peripheral vein.  ? ?Return as scheduled.  ? ? ?Thank you for choosing [facility] to provide your oncology and hematology care.  To afford each patient quality time with our provider, please arrive at least 15 minutes before your scheduled appointment time.  ? ?If you have a lab appointment with the [facility] please come in thru the Main Entrance and check in at the main information desk. ? ?You need to re-schedule your appointment should you arrive 10 or more minutes late.  We strive to give you quality time with our providers, and arriving late affects you and other patients whose appointments are after yours.  Also, if you no show three or more times for appointments you may be dismissed from the clinic at the providers discretion.     ?Again, thank you for choosing [facility].  Our hope is that these requests will decrease the amount of time that you wait before being seen by our physicians.       ?_____________________________________________________________ ? ?Should you have questions after your visit to [facility], please contact our office at [phone] and follow the prompts.  Our office hours are 8:00 a.m. and 4:30 p.m. Monday - Friday.  Please note that voicemails left after 4:00  p.m. may not be returned until the following business day.  We are closed weekends and major holidays.  You do have access to a nurse 24-7, just call the main number to the clinic [phone] and do not press any options, hold on the line and a nurse will answer the phone.   ? ?For prescription refill requests, have your pharmacy contact our office and allow 72 hours.   ? ?Due to Covid, you will need to wear a mask upon entering the hospital. If you do not have a mask, a mask will be given to you at the Main Entrance upon arrival. For doctor visits, patients may have 1 support person age 48 or older with them. For treatment visits, patients can not have anyone with them due to social distancing guidelines and our immunocompromised population.  ? ?   ?

## 2022-02-16 NOTE — Addendum Note (Signed)
Addended by: Derek Jack on: 02/16/2022 03:19 PM ? ? Modules accepted: Orders ? ?

## 2022-02-17 ENCOUNTER — Encounter: Payer: Self-pay | Admitting: Adult Health

## 2022-02-17 ENCOUNTER — Encounter (HOSPITAL_COMMUNITY): Payer: Self-pay | Admitting: Hematology

## 2022-02-17 LAB — GASTROINTESTINAL PANEL BY PCR, STOOL (REPLACES STOOL CULTURE)

## 2022-02-17 MED ORDER — PROCHLORPERAZINE MALEATE 10 MG PO TABS: 10.0000 mg | ORAL_TABLET | Freq: Four times a day (QID) | ORAL | 3 refills | Status: DC | PRN

## 2022-02-17 NOTE — Patient Instructions (Signed)
[facility] ?Chemotherapy Teaching ? ? ?You are diagnosed with Stage IVa mantle cell lymphoma.  We will treat you in the clinic every 4 weeks with two different drugs.  Those drugs are rituximab (Rituxan) and bendamustine.  You will come two days in a row every 4 weeks.  On the first day you will receive both drugs.  On the second day, you will receive the bendamustine only.  We will do this for a total of 6 cycles, then we will have you come in every 2 months for maintenance therapy with Rituxan only.  The intent of treatment is to control this cancer, shrink it and prevent it from spreading further, and to alleviate any symptoms you may be having related to this lymphoma.  You will see the doctor regularly throughout treatment.  We will obtain blood work from you prior to every treatment and monitor your results to make sure it is safe to give your treatment. The doctor monitors your response to treatment by the way you are feeling, your blood work, and by obtaining scans periodically.  There will be wait times while you are here for treatment.  It will take about 30 minutes to 1 hour for your lab work to result.  Then there will be wait times while pharmacy mixes your medications.   ? ? ?Medications you will receive in the clinic prior to your chemotherapy medications: ? ?Aloxi:  ALOXI is used in adults to help prevent nausea and vomiting that happens with certain chemotherapy drugs.  Aloxi is a long acting medication, and will remain in your system for about two days.  ? ?Dexamethasone:  This is a steroid given prior to chemotherapy to help prevent allergic reactions; it may also help prevent and control nausea and diarrhea.  ? ?Tylenol:  Given to prevent infusion reactions to rituximab. ? ?Benadryl:  Antihistamine given to prevent allergic/infusion reactions to rituximab.  ? ? ? ?Rituximab (Generic Name) ?Other Name: Rituxan? ? ?About This Drug ? ?Rituximab is a monoclonal antibody used to treat  cancer. This drug is given in the vein (IV).  This first time this is given it will be infused slower to monitor for infusion reactions.   ? ?Possible Side Effects (More Common) ? ? Bone marrow depression. This is a decrease in the number of white blood cells, red blood cells, and platelets. This  ?may raise your risk of infection, make you tired and weak (fatigue), and raise your risk of bleeding. ? ? Rash-skin irritation, redness or itching (dermatitis) ? ? Flu-like symptoms: fever, headache, muscle and joint aches, and fatigue (low energy, feeling weak) ? ? Infusion-related reactions ? ? Hepatitis B - if you have ever had hepatitis B, the virus may come back during treatment with this drug. Your doctor will test to see if you have ever had hepatitis B prior to your treatment. ? ? Changes in your central nervous system can happen. The central nervous system is made up of your brain and spinal cord. You could feel: extreme tiredness, agitation, confusion, or have: hallucinations (see or hear things that are not there), trouble understanding or speaking, loss of control of your bowels or bladder, eyesight changes, numbness or lack of strength to your arms, legs, face, or body, seizures or coma. If you start to have any of these symptoms let your doctor know right away. ? ? Tumor lysis: This drug may act on the cancer cells very quickly. This may affect how your kidneys  work. Your doctor will monitor your kidney function. ? ? Changes in your liver function. Your doctor will check your liver function as needed. ? ? Nausea and throwing up (vomiting): these symptoms may happen within a few hours after your treatment and may last up to 24 hours. Medicines are available to stop or lessen these side effects. ? ? Loose bowel movements (diarrhea) that may last for a few days ? ? Abdominal pain ? ? Infections ? ? Cough, runny nose ? ? Swelling of your legs, ankles and/or feet or hands ? ? High blood pressure. Your doctor will  check your blood pressure as needed. ? ? Abnormal heart beat ? ?Possible Side Effects (Less Common) ? ? Shortness of breath ? ? Soreness of the mouth and throat. You may have red areas, white patches, or sores that hurt. ? ?Infusion Reactions ? ?Infusion Reactions are the most common side effect linked to use of this drug and can be quite severe. Medicines will be given before you get the drug to lower the severity of this side effect. The infusion ?reactions are the worse with the first dose of the drug and become less severe with more doses of the drug. While you are getting this drug in your vein (IV), tell your nurse right away if you have any of these symptoms of an allergic reaction: ? ? Trouble catching your breath ? ? Feeling like your tongue or throat are swelling ? ? Feeling your heart beat quickly or in a not normal way (palpitations) ? ? Feeling dizzy or lightheaded ? ? Flushing, itching, rash, and/or hives ? ? ?Treating Side Effects ? ? Ask your doctor or nurse about medicine to stop or lessen headache, loose bowel movements (diarrhea), constipation, nausea, throwing up (vomiting), or pain. ? ? If you get a rash do not put anything it unless your doctor or nurse says you may. Keep the area around the rash clean and dry. Ask your doctor for medicine if the rash bothers you. ? ? Drink 6-8 cups of fluids each day unless your doctor has told you to limit your fluid intake due to some other health problem. A cup is 8 ounces of fluid. If you throw up or have loose bowel movements, you should drink more fluids so that you do not become dehydrated (lack of water in the body from losing too much fluid). ? ? If you are not able to move your bowels, check with your doctor or nurse before you use enemas, laxatives, or suppositories ? ? If you have mouth sores, avoid mouthwash that has alcohol. Also avoid alcohol and smoking because they can bother your mouth and throat. ? ? If you have a nose bleed, sit with your  head tipped slightly forward. Apply pressure by lightly pinching the bridge of your nose between your thumb and forefinger. Call your doctor if you feel dizzy or faint or if the bleeding doesn't stop after 10 to 15 minutes ? ? ?Important Information ? ? After treatment with this drug, vaccination with live viruses should be delayed until the immune system recovers. ? ? Symptoms of abnormal bleeding may be: coughing up blood, throwing up blood (may look like coffee grounds), red or black, tarry bowel movements, blood in urine, abnormally heavy menstrual flow, nosebleeds, or any unusual bleeding. ? ? Symptoms of high blood pressure may be: headache, blurred vision, confusion, chest pain, or a feeling that your heart is beat differently. ? ? Urinary tract infection.  Symptoms may include: ? ? Pain or burning when you pass urine ? ? Feeling like you have to pass urine often, but not much comes out when you do. ? ? Tender or heavy feeling in your lower abdomen ? ? Cloudy urine and/or urine that smells bad. ? ? Pain on one side of your back under your ribs. This is where your kidneys are. ? ? Fever, chills, nausea and/or throwing up ? ? ?Food and Drug Interactions ? ?There are no known interactions of rituximab and any food. This drug may interact with other medicines. Tell your doctor and pharmacist about all the medicines and dietary supplements (vitamins, minerals, herbs and others) that you are taking at this time. The safety and use of dietary supplements and alternative diets are often not known. Using these might affect your cancer or interfere with your treatment. Until more is known, you should not use dietary supplements or alternative diets without your cancer doctor's help. ? ? ?When to Call the Doctor ? ?Call your doctor or nurse right away if you have any of these symptoms: ? ? Fever of 100.4 F (38 C) higher ? ? Chills ? ? Trouble breathing ? ? Rash with or without itching ? ? Blistering or peeling of  skin ? ? Chest pain or symptoms of a heart attack. Most heart attacks involve pain in the center of the chest that lasts more than a few minutes. The pain may go away and come back or it can be constant. It ca

## 2022-02-17 NOTE — Addendum Note (Signed)
Addended by: Derek Jack on: 02/17/2022 07:43 AM ? ? Modules accepted: Orders ? ?

## 2022-02-17 NOTE — Progress Notes (Deleted)
?Location:  [facility] Room Number: 141-W ?Place of Service:  SNF (31) ? ? ?CODE STATUS: *** ? ?Allergies  ?Allergen Reactions  ?? Lidocaine Other (See Comments) and Hypertension  ?  Patient had chest tightness with stomach pain, numbness on left side of limbs and unable to speak  ? ? ?Chief Complaint  ?Patient presents with  ?? Follow-up  ?  Transfer follow-up   ?? Error  ? ? ?HPI: ? ? ? ?Past Medical History:  ?Diagnosis Date  ?? Anxiety   ?? Hypertension   ?? Irritable bowel disease   ?? Osteoarthritis   ? ? ?Past Surgical History:  ?Procedure Laterality Date  ?? ABDOMINAL HYSTERECTOMY  1974  ?? CARDIAC CATHETERIZATION    ? 1991 and 2005  ? ? ?Social History  ? ?Socioeconomic History  ?? Marital status: Widowed  ?  Spouse name: Not on file  ?? Number of children: Not on file  ?? Years of education: Not on file  ?? Highest education level: Not on file  ?Occupational History  ?? Not on file  ?Tobacco Use  ?? Smoking status: Never  ?? Smokeless tobacco: Never  ?Vaping Use  ?? Vaping Use: Never used  ?Substance and Sexual Activity  ?? Alcohol use: No  ?? Drug use: No  ?? Sexual activity: Not on file  ?Other Topics Concern  ?? Not on file  ?Social History Narrative  ?? Not on file  ? ?Social Determinants of Health  ? ?Financial Resource Strain: Low Risk   ?? Difficulty of Paying Living Expenses: Not very hard  ?Food Insecurity: No Food Insecurity  ?? Worried About Charity fundraiser in the Last Year: Never true  ?? Ran Out of Food in the Last Year: Never true  ?Transportation Needs: No Transportation Needs  ?? Lack of Transportation (Medical): No  ?? Lack of Transportation (Non-Medical): No  ?Physical Activity: Insufficiently Active  ?? Days of Exercise per Week: 2 days  ?? Minutes of Exercise per Session: 10 min  ?Stress: Stress Concern Present  ?? Feeling of Stress : To some extent  ?Social Connections: Moderately Integrated  ?? Frequency of Communication with Friends and Family: More than  three times a week  ?? Frequency of Social Gatherings with Friends and Family: Twice a week  ?? Attends Religious Services: More than 4 times per year  ?? Active Member of Clubs or Organizations: Yes  ?? Attends Archivist Meetings: 1 to 4 times per year  ?? Marital Status: Widowed  ?Intimate Partner Violence: Not At Risk  ?? Fear of Current or Ex-Partner: No  ?? Emotionally Abused: No  ?? Physically Abused: No  ?? Sexually Abused: No  ? ?Family History  ?Problem Relation Age of Onset  ?? Renal Disease Sister   ?? Multiple myeloma Sister   ? ? ? ? ?VITAL SIGNS ?There were no vitals taken for this visit. ? ?Outpatient Encounter Medications as of 02/17/2022  ?Medication Sig  ?? acetaminophen (TYLENOL) 500 MG tablet Take 1,000 mg by mouth every 6 (six) hours as needed for moderate pain.  ?? allopurinol (ZYLOPRIM) 300 MG tablet Take 1 tablet (300 mg total) by mouth daily.  ?? furosemide (LASIX) 20 MG tablet Take 20 mg by mouth daily.  ?? LORazepam (ATIVAN) 0.5 MG tablet Take 0.5 mg by mouth 2 (two) times daily.  ?? metoprolol tartrate (LOPRESSOR) 25 MG tablet Take 25 mg by mouth 2 (two) times daily.  ?? mirtazapine (REMERON) 7.5 MG  tablet Take 7.5 mg by mouth at bedtime.  ?? olmesartan-hydrochlorothiazide (BENICAR HCT) 40-25 MG tablet Take 1 tablet by mouth daily.  ?? potassium chloride (MICRO-K) 10 MEQ CR capsule Take 10 mEq by mouth daily.  ?? sulfamethoxazole-trimethoprim (BACTRIM DS) 800-160 MG tablet Take 1 tablet by mouth 2 (two) times daily.  ? ?No facility-administered encounter medications on file as of 02/17/2022.  ? ? ? ?SIGNIFICANT DIAGNOSTIC EXAMS ? ? ? ? ? ? ?ASSESSMENT/ PLAN: ? ? ? ? ?Ok Edwards NP ?[facility]  ?Contact [phone] Monday through Friday 8am- 5pm  ?After hours call [phone]  ? ?This encounter was created in error - please disregard. ?

## 2022-02-17 NOTE — Progress Notes (Signed)
Error, patient has not arrived at [facility] yet  ?

## 2022-02-18 ENCOUNTER — Encounter (HOSPITAL_COMMUNITY): Payer: Self-pay

## 2022-02-18 ENCOUNTER — Inpatient Hospital Stay (HOSPITAL_COMMUNITY): Payer: Medicare Other

## 2022-02-18 ENCOUNTER — Other Ambulatory Visit: Payer: Self-pay

## 2022-02-18 VITALS — BP 109/48 | HR 94 | Temp 98.9°F | Resp 20 | Ht 60.5 in | Wt 158.2 lb

## 2022-02-18 DIAGNOSIS — Z79899 Other long term (current) drug therapy: Secondary | ICD-10-CM | POA: Diagnosis not present

## 2022-02-18 DIAGNOSIS — K5900 Constipation, unspecified: Secondary | ICD-10-CM | POA: Diagnosis not present

## 2022-02-18 DIAGNOSIS — N390 Urinary tract infection, site not specified: Secondary | ICD-10-CM | POA: Diagnosis not present

## 2022-02-18 DIAGNOSIS — C8318 Mantle cell lymphoma, lymph nodes of multiple sites: Secondary | ICD-10-CM

## 2022-02-18 DIAGNOSIS — M7989 Other specified soft tissue disorders: Secondary | ICD-10-CM | POA: Diagnosis not present

## 2022-02-18 DIAGNOSIS — M1990 Unspecified osteoarthritis, unspecified site: Secondary | ICD-10-CM | POA: Diagnosis not present

## 2022-02-18 DIAGNOSIS — D649 Anemia, unspecified: Secondary | ICD-10-CM | POA: Diagnosis not present

## 2022-02-18 DIAGNOSIS — R5383 Other fatigue: Secondary | ICD-10-CM | POA: Diagnosis not present

## 2022-02-18 DIAGNOSIS — Z5111 Encounter for antineoplastic chemotherapy: Secondary | ICD-10-CM | POA: Diagnosis not present

## 2022-02-18 DIAGNOSIS — Z9221 Personal history of antineoplastic chemotherapy: Secondary | ICD-10-CM | POA: Diagnosis not present

## 2022-02-18 DIAGNOSIS — C9110 Chronic lymphocytic leukemia of B-cell type not having achieved remission: Secondary | ICD-10-CM | POA: Diagnosis not present

## 2022-02-18 DIAGNOSIS — R634 Abnormal weight loss: Secondary | ICD-10-CM | POA: Diagnosis not present

## 2022-02-18 DIAGNOSIS — I10 Essential (primary) hypertension: Secondary | ICD-10-CM | POA: Diagnosis not present

## 2022-02-18 DIAGNOSIS — R61 Generalized hyperhidrosis: Secondary | ICD-10-CM | POA: Diagnosis not present

## 2022-02-18 DIAGNOSIS — K6289 Other specified diseases of anus and rectum: Secondary | ICD-10-CM

## 2022-02-18 DIAGNOSIS — Z806 Family history of leukemia: Secondary | ICD-10-CM | POA: Diagnosis not present

## 2022-02-18 LAB — CBC WITH DIFFERENTIAL/PLATELET
Basophils Absolute: 0 10*3/uL (ref 0.0–0.1)
Basophils Relative: 0 %
Blasts: 46 %
Eosinophils Absolute: 0 10*3/uL (ref 0.0–0.5)
Eosinophils Relative: 0 %
HCT: 23.5 % — ABNORMAL LOW (ref 36.0–46.0)
Hemoglobin: 7.7 g/dL — ABNORMAL LOW (ref 12.0–15.0)
Lymphocytes Relative: 43 %
Lymphs Abs: 26.5 10*3/uL — ABNORMAL HIGH (ref 0.7–4.0)
MCH: 33.6 pg (ref 26.0–34.0)
MCHC: 32.8 g/dL (ref 30.0–36.0)
MCV: 102.6 fL — ABNORMAL HIGH (ref 80.0–100.0)
Monocytes Absolute: 2.5 10*3/uL — ABNORMAL HIGH (ref 0.1–1.0)
Monocytes Relative: 4 %
Neutro Abs: 4.3 10*3/uL (ref 1.7–7.7)
Neutrophils Relative %: 7 %
Platelets: 157 10*3/uL (ref 150–400)
RBC: 2.29 MIL/uL — ABNORMAL LOW (ref 3.87–5.11)
RDW: 17.5 % — ABNORMAL HIGH (ref 11.5–15.5)
WBC: 61.7 10*3/uL (ref 4.0–10.5)
nRBC: 0 % (ref 0.0–0.2)

## 2022-02-18 LAB — COMPREHENSIVE METABOLIC PANEL
ALT: 13 U/L (ref 0–44)
AST: 30 U/L (ref 15–41)
Albumin: 3.5 g/dL (ref 3.5–5.0)
Alkaline Phosphatase: 53 U/L (ref 38–126)
Anion gap: 8 (ref 5–15)
BUN: 20 mg/dL (ref 8–23)
CO2: 24 mmol/L (ref 22–32)
Calcium: 8.3 mg/dL — ABNORMAL LOW (ref 8.9–10.3)
Chloride: 100 mmol/L (ref 98–111)
Creatinine, Ser: 1.35 mg/dL — ABNORMAL HIGH (ref 0.44–1.00)
GFR, Estimated: 38 mL/min — ABNORMAL LOW (ref >60)
Glucose, Bld: 110 mg/dL — ABNORMAL HIGH (ref 70–99)
Potassium: 4.3 mmol/L (ref 3.5–5.1)
Sodium: 132 mmol/L — ABNORMAL LOW (ref 135–145)
Total Bilirubin: 0.6 mg/dL (ref 0.3–1.2)
Total Protein: 6 g/dL — ABNORMAL LOW (ref 6.5–8.1)

## 2022-02-18 LAB — MAGNESIUM: Magnesium: 2 mg/dL (ref 1.7–2.4)

## 2022-02-18 LAB — LACTATE DEHYDROGENASE: LDH: 260 U/L — ABNORMAL HIGH (ref 98–192)

## 2022-02-18 LAB — PHOSPHORUS: Phosphorus: 3.6 mg/dL (ref 2.5–4.6)

## 2022-02-18 LAB — SAMPLE TO BLOOD BANK

## 2022-02-18 LAB — URIC ACID: Uric Acid, Serum: 5.5 mg/dL (ref 2.5–7.1)

## 2022-02-18 MED ORDER — PROCHLORPERAZINE MALEATE 10 MG PO TABS
10.0000 mg | ORAL_TABLET | Freq: Four times a day (QID) | ORAL | 3 refills | Status: DC | PRN
Start: 2022-02-18 — End: 2022-03-05

## 2022-02-18 MED ORDER — FAMOTIDINE IN NACL 20-0.9 MG/50ML-% IV SOLN
20.0000 mg | Freq: Once | INTRAVENOUS | Status: AC
  Administered 2022-02-18: 20 mg via INTRAVENOUS
  Filled 2022-02-18: qty 50

## 2022-02-18 MED ORDER — METHYLPREDNISOLONE SODIUM SUCC 125 MG IJ SOLR
125.0000 mg | Freq: Once | INTRAMUSCULAR | Status: AC | PRN
  Administered 2022-02-18: 125 mg via INTRAVENOUS

## 2022-02-18 MED ORDER — SODIUM CHLORIDE 0.9 % IV SOLN
50.0000 mg/m2 | Freq: Once | INTRAVENOUS | Status: AC
  Administered 2022-02-18: 100 mg via INTRAVENOUS
  Filled 2022-02-18: qty 4

## 2022-02-18 MED ORDER — SODIUM CHLORIDE 0.9 % IV SOLN
375.0000 mg/m2 | Freq: Once | INTRAVENOUS | Status: AC
  Administered 2022-02-18: 700 mg via INTRAVENOUS
  Filled 2022-02-18: qty 50

## 2022-02-18 MED ORDER — EPINEPHRINE 0.3 MG/0.3ML IJ SOAJ
0.3000 mg | Freq: Once | INTRAMUSCULAR | Status: DC | PRN
  Filled 2022-02-18: qty 0.6

## 2022-02-18 MED ORDER — ACETAMINOPHEN 325 MG PO TABS
650.0000 mg | ORAL_TABLET | Freq: Four times a day (QID) | ORAL | Status: DC | PRN
  Administered 2022-02-18: 650 mg via ORAL
  Filled 2022-02-18: qty 2

## 2022-02-18 MED ORDER — SODIUM CHLORIDE 0.9% FLUSH: 10.0000 mL | INTRAVENOUS | Status: DC | PRN

## 2022-02-18 MED ORDER — ACETAMINOPHEN 325 MG PO TABS
650.0000 mg | ORAL_TABLET | Freq: Once | ORAL | Status: AC
  Administered 2022-02-18: 650 mg via ORAL
  Filled 2022-02-18: qty 2

## 2022-02-18 MED ORDER — ALBUTEROL SULFATE (2.5 MG/3ML) 0.083% IN NEBU: 2.5000 mg | INHALATION_SOLUTION | Freq: Once | RESPIRATORY_TRACT | Status: DC | PRN

## 2022-02-18 MED ORDER — FAMOTIDINE IN NACL 20-0.9 MG/50ML-% IV SOLN: 20.0000 mg | Freq: Once | INTRAVENOUS | Status: DC | PRN

## 2022-02-18 MED ORDER — SODIUM CHLORIDE 0.9 % IV SOLN: Freq: Once | INTRAVENOUS | Status: DC | PRN

## 2022-02-18 MED ORDER — DIPHENHYDRAMINE HCL 25 MG PO CAPS
25.0000 mg | ORAL_CAPSULE | Freq: Once | ORAL | Status: AC
  Administered 2022-02-18: 25 mg via ORAL
  Filled 2022-02-18: qty 1

## 2022-02-18 MED ORDER — SODIUM CHLORIDE 0.9 % IV SOLN
10.0000 mg | Freq: Once | INTRAVENOUS | Status: AC
  Administered 2022-02-18: 10 mg via INTRAVENOUS
  Filled 2022-02-18: qty 10

## 2022-02-18 MED ORDER — PALONOSETRON HCL INJECTION 0.25 MG/5ML
0.2500 mg | Freq: Once | INTRAVENOUS | Status: AC
  Administered 2022-02-18: 0.25 mg via INTRAVENOUS
  Filled 2022-02-18: qty 5

## 2022-02-18 MED ORDER — SODIUM CHLORIDE 0.9 % IV SOLN
6.0000 mg | Freq: Once | INTRAVENOUS | Status: AC
  Administered 2022-02-18: 6 mg via INTRAVENOUS
  Filled 2022-02-18: qty 4

## 2022-02-18 MED ORDER — SODIUM CHLORIDE 0.9 % IV SOLN: Freq: Once | INTRAVENOUS | Status: AC

## 2022-02-18 MED ORDER — MORPHINE SULFATE (PF) 2 MG/ML IV SOLN
1.0000 mg | Freq: Once | INTRAVENOUS | Status: AC
  Administered 2022-02-18: 1 mg via INTRAVENOUS
  Filled 2022-02-18: qty 1

## 2022-02-18 MED ORDER — DIPHENHYDRAMINE HCL 50 MG/ML IJ SOLN: 50.0000 mg | Freq: Once | INTRAMUSCULAR | Status: DC | PRN

## 2022-02-18 NOTE — Progress Notes (Signed)

## 2022-02-18 NOTE — Patient Instructions (Signed)
[facility]  Discharge Instructions: ?Thank you for choosing [facility] to provide your oncology and hematology care.  ?If you have a lab appointment with the [facility], please come in thru the Main Entrance and check in at the main information desk. ? ?Wear comfortable clothing and clothing appropriate for easy access to any Portacath or PICC line.  ? ?We strive to give you quality time with your provider. You may need to reschedule your appointment if you arrive late (15 or more minutes).  Arriving late affects you and other patients whose appointments are after yours.  Also, if you miss three or more appointments without notifying the office, you may be dismissed from the clinic at the provider?s discretion.    ?  ?For prescription refill requests, have your pharmacy contact our office and allow 72 hours for refills to be completed.   ? ?Today you received the following chemotherapy and/or immunotherapy agents Rituximab and Bendamustine, return as scheduled. ? ?To help prevent nausea and vomiting after your treatment, we encourage you to take your nausea medication as directed. ? ?BELOW ARE SYMPTOMS THAT SHOULD BE REPORTED IMMEDIATELY: ?*FEVER GREATER THAN 100.4 F (38 ?C) OR HIGHER ?*CHILLS OR SWEATING ?*NAUSEA AND VOMITING THAT IS NOT CONTROLLED WITH YOUR NAUSEA MEDICATION ?*UNUSUAL SHORTNESS OF BREATH ?*UNUSUAL BRUISING OR BLEEDING ?*URINARY PROBLEMS (pain or burning when urinating, or frequent urination) ?*BOWEL PROBLEMS (unusual diarrhea, constipation, pain near the anus) ?TENDERNESS IN MOUTH AND THROAT WITH OR WITHOUT PRESENCE OF ULCERS (sore throat, sores in mouth, or a toothache) ?UNUSUAL RASH, SWELLING OR PAIN  ?UNUSUAL VAGINAL DISCHARGE OR ITCHING  ? ?Items with * indicate a potential emergency and should be followed up as soon as possible or go to the Emergency Department if any problems should occur. ? ?Please show the CHEMOTHERAPY ALERT CARD or IMMUNOTHERAPY ALERT CARD  at check-in to the Emergency Department and triage nurse. ? ?Should you have questions after your visit or need to cancel or reschedule your appointment, please contact [facility] [phone]  and follow the prompts.  Office hours are 8:00 a.m. to 4:30 p.m. Monday - Friday. Please note that voicemails left after 4:00 p.m. may not be returned until the following business day.  We are closed weekends and major holidays. You have access to a nurse at all times for urgent questions. Please call the main number to the clinic [phone] and follow the prompts. ? ?For any non-urgent questions, you may also contact your provider using MyChart. We now offer e-Visits for anyone 67 and older to request care online for non-urgent symptoms. For details visit [email or web address]. ?  ?Also download the MyChart app! Go to the app store, search "MyChart", open the app, select [facility], and log in with your MyChart username and password. ? ?Due to Covid, a mask is required upon entering the hospital/clinic. If you do not have a mask, one will be given to you upon arrival. For doctor visits, patients may have 1 support person aged 36 or older with them. For treatment visits, patients cannot have anyone with them due to current Covid guidelines and our immunocompromised population.  ?

## 2022-02-18 NOTE — Progress Notes (Signed)
Pharmacist Chemotherapy Monitoring - Initial Assessment   ? ?Anticipated start date: 02/18/2022  ? ?The following has been reviewed per standard work regarding the patient's treatment regimen: ?The patient's diagnosis, treatment plan and drug doses, and organ/hematologic function ?Lab orders and baseline tests specific to treatment regimen  ?The treatment plan start date, drug sequencing, and pre-medications ?Prior authorization status  ?Patient's documented medication list, including drug-drug interaction screen and prescriptions for anti-emetics and supportive care specific to the treatment regimen ?The drug concentrations, fluid compatibility, administration routes, and timing of the medications to be used ?The patient's access for treatment and lifetime cumulative dose history, if applicable  ?The patient's medication allergies and previous infusion related reactions, if applicable  ? ?Changes made to treatment plan:  ?N/A ? ?Follow up needed:  ?Hep B panels are reactive. ? ? ?Faith Lee, [facility], ?02/18/2022  9:44 AM ? ?

## 2022-02-18 NOTE — Progress Notes (Signed)
Patient presents today for D1C1 Rituiamab, Hgb 7.8, okay to proceed with treatment per Dr. Delton Coombes. Consent signed. ?Rituximab started at 1146, at 48 patient's daughter called out stating patient is complaining of pressure in her chest. Rituxamab stopped and NS bolus started. At 1156 Dr. Delton Coombes arrived in room to assessed patient. 125 mg Solu-medrol and 1 mg Morphine ordered and given per orders. EKG ordered and examined by Dr. Delton Coombes. Vitals remained stable. Per Dr. Delton Coombes RN is okay to start back Rituximab 30 mins after chest pressure resolves.  ?Patient tolerated chemotherapy with no complaints voiced. Side effects with management reviewed understanding verbalized. IV site clean and dry with no bruising or swelling noted at site. Good blood return noted before and after administration of chemotherapy. Band aid applied. Patient left in satisfactory condition with VSS and no s/s of distress noted.  ?

## 2022-02-18 NOTE — Progress Notes (Signed)
CRITICAL VALUE ALERT ?Critical value received:  WBC 61.7 ?Date of notification:  02-18-2022 ?Time of notification: 0920 ?Critical value read back:  Yes.   ?Nurse who received alert:  C. Kaven Cumbie RN ?MD notified time and response:  [id number] , no new orders.  ?

## 2022-02-18 NOTE — Progress Notes (Signed)
Hypersensitivity Reaction note ? ?Date of event: 02/18/22 ?Time of event: 1155 ?Generic name of drug involved: Rituximab-pvvr ?Name of provider notified of the hypersensitivity reaction: Dr. Delton Coombes ?Was agent that likely caused hypersensitivity reaction added to Allergies List within EMR? Yes ?Chain of events including reaction signs/symptoms, treatment administered, and outcome (e.g., drug resumed; drug discontinued; sent to Emergency Department; etc.) Rituximab started at 1146, at 65 patient's daughter called out stating patient is complaining of pressure in her chest. Rituxamab stopped and NS bolus started. At 1156 Dr. Delton Coombes arrived in room to assessed patient. 125 mg Solu-medrol and 1 mg Morphine ordered and given per orders. EKG ordered and examined by Dr. Delton Coombes. Vitals remained stable. Per Dr. Delton Coombes RN is okay to start back Rituximab 30 mins after chest pressure resolves. Patient tolerated infusion with no complaints voiced. ? ?Elpidio Anis, RN ?02/18/2022 5:00 PM ? ? ? ?

## 2022-02-19 ENCOUNTER — Other Ambulatory Visit (HOSPITAL_COMMUNITY): Payer: Self-pay | Admitting: Physician Assistant

## 2022-02-19 ENCOUNTER — Encounter: Payer: Self-pay | Admitting: Adult Health

## 2022-02-19 ENCOUNTER — Other Ambulatory Visit: Payer: Self-pay | Admitting: Adult Health

## 2022-02-19 ENCOUNTER — Inpatient Hospital Stay (HOSPITAL_COMMUNITY): Payer: Medicare Other

## 2022-02-19 ENCOUNTER — Non-Acute Institutional Stay (SKILLED_NURSING_FACILITY): Payer: Medicare Other | Admitting: Adult Health

## 2022-02-19 VITALS — BP 101/55 | HR 72 | Temp 97.6°F | Resp 16

## 2022-02-19 DIAGNOSIS — M1A30X0 Chronic gout due to renal impairment, unspecified site, without tophus (tophi): Secondary | ICD-10-CM | POA: Diagnosis not present

## 2022-02-19 DIAGNOSIS — R600 Localized edema: Secondary | ICD-10-CM

## 2022-02-19 DIAGNOSIS — M1990 Unspecified osteoarthritis, unspecified site: Secondary | ICD-10-CM | POA: Diagnosis not present

## 2022-02-19 DIAGNOSIS — C8318 Mantle cell lymphoma, lymph nodes of multiple sites: Secondary | ICD-10-CM

## 2022-02-19 DIAGNOSIS — R634 Abnormal weight loss: Secondary | ICD-10-CM | POA: Diagnosis not present

## 2022-02-19 DIAGNOSIS — I10 Essential (primary) hypertension: Secondary | ICD-10-CM | POA: Diagnosis not present

## 2022-02-19 DIAGNOSIS — Z5111 Encounter for antineoplastic chemotherapy: Secondary | ICD-10-CM | POA: Diagnosis not present

## 2022-02-19 DIAGNOSIS — D649 Anemia, unspecified: Secondary | ICD-10-CM | POA: Diagnosis not present

## 2022-02-19 DIAGNOSIS — F339 Major depressive disorder, recurrent, unspecified: Secondary | ICD-10-CM

## 2022-02-19 DIAGNOSIS — M7989 Other specified soft tissue disorders: Secondary | ICD-10-CM | POA: Diagnosis not present

## 2022-02-19 DIAGNOSIS — N390 Urinary tract infection, site not specified: Secondary | ICD-10-CM | POA: Diagnosis not present

## 2022-02-19 DIAGNOSIS — Z79899 Other long term (current) drug therapy: Secondary | ICD-10-CM | POA: Diagnosis not present

## 2022-02-19 DIAGNOSIS — I700 Atherosclerosis of aorta: Secondary | ICD-10-CM | POA: Diagnosis not present

## 2022-02-19 DIAGNOSIS — Z806 Family history of leukemia: Secondary | ICD-10-CM | POA: Diagnosis not present

## 2022-02-19 DIAGNOSIS — R61 Generalized hyperhidrosis: Secondary | ICD-10-CM | POA: Diagnosis not present

## 2022-02-19 DIAGNOSIS — Z9221 Personal history of antineoplastic chemotherapy: Secondary | ICD-10-CM | POA: Diagnosis not present

## 2022-02-19 DIAGNOSIS — N1832 Chronic kidney disease, stage 3b: Secondary | ICD-10-CM | POA: Diagnosis not present

## 2022-02-19 DIAGNOSIS — D500 Iron deficiency anemia secondary to blood loss (chronic): Secondary | ICD-10-CM | POA: Diagnosis not present

## 2022-02-19 DIAGNOSIS — R5383 Other fatigue: Secondary | ICD-10-CM | POA: Diagnosis not present

## 2022-02-19 DIAGNOSIS — I129 Hypertensive chronic kidney disease with stage 1 through stage 4 chronic kidney disease, or unspecified chronic kidney disease: Secondary | ICD-10-CM

## 2022-02-19 DIAGNOSIS — N183 Chronic kidney disease, stage 3 unspecified: Secondary | ICD-10-CM

## 2022-02-19 DIAGNOSIS — K5900 Constipation, unspecified: Secondary | ICD-10-CM | POA: Diagnosis not present

## 2022-02-19 DIAGNOSIS — C9110 Chronic lymphocytic leukemia of B-cell type not having achieved remission: Secondary | ICD-10-CM | POA: Diagnosis not present

## 2022-02-19 LAB — COMPREHENSIVE METABOLIC PANEL
ALT: 17 U/L (ref 0–44)
AST: 51 U/L — ABNORMAL HIGH (ref 15–41)
Albumin: 3.4 g/dL — ABNORMAL LOW (ref 3.5–5.0)
Alkaline Phosphatase: 53 U/L (ref 38–126)
Anion gap: 10 (ref 5–15)
BUN: 37 mg/dL — ABNORMAL HIGH (ref 8–23)
CO2: 22 mmol/L (ref 22–32)
Calcium: 7.9 mg/dL — ABNORMAL LOW (ref 8.9–10.3)
Chloride: 100 mmol/L (ref 98–111)
Creatinine, Ser: 1.94 mg/dL — ABNORMAL HIGH (ref 0.44–1.00)
GFR, Estimated: 24 mL/min — ABNORMAL LOW (ref >60)
Glucose, Bld: 111 mg/dL — ABNORMAL HIGH (ref 70–99)
Potassium: 5.4 mmol/L — ABNORMAL HIGH (ref 3.5–5.1)
Sodium: 132 mmol/L — ABNORMAL LOW (ref 135–145)
Total Bilirubin: 0.6 mg/dL (ref 0.3–1.2)
Total Protein: 5.7 g/dL — ABNORMAL LOW (ref 6.5–8.1)

## 2022-02-19 LAB — PATHOLOGIST SMEAR REVIEW

## 2022-02-19 LAB — TYPE AND SCREEN
ABO/RH(D): A POS
Antibody Screen: NEGATIVE

## 2022-02-19 LAB — MAGNESIUM: Magnesium: 2 mg/dL (ref 1.7–2.4)

## 2022-02-19 LAB — PHOSPHORUS: Phosphorus: 5.4 mg/dL — ABNORMAL HIGH (ref 2.5–4.6)

## 2022-02-19 LAB — PREPARE RBC (CROSSMATCH)

## 2022-02-19 LAB — URIC ACID: Uric Acid, Serum: <1.5 mg/dL — ABNORMAL LOW (ref 2.5–7.1)

## 2022-02-19 MED ORDER — SODIUM CHLORIDE 0.9 % IV SOLN: Freq: Once | INTRAVENOUS | Status: AC

## 2022-02-19 MED ORDER — SODIUM CHLORIDE 0.9% IV SOLUTION
250.0000 mL | Freq: Once | INTRAVENOUS | Status: AC
  Administered 2022-02-19: 250 mL via INTRAVENOUS

## 2022-02-19 MED ORDER — DIPHENHYDRAMINE HCL 25 MG PO CAPS
25.0000 mg | ORAL_CAPSULE | Freq: Once | ORAL | Status: AC
  Administered 2022-02-19: 25 mg via ORAL
  Filled 2022-02-19: qty 1

## 2022-02-19 MED ORDER — ACETAMINOPHEN 325 MG PO TABS
650.0000 mg | ORAL_TABLET | Freq: Once | ORAL | Status: AC
  Administered 2022-02-19: 650 mg via ORAL
  Filled 2022-02-19: qty 2

## 2022-02-19 MED ORDER — SODIUM CHLORIDE 0.9% FLUSH: 10.0000 mL | INTRAVENOUS | Status: DC | PRN

## 2022-02-19 MED ORDER — LORAZEPAM 0.5 MG PO TABS: 0.5000 mg | ORAL_TABLET | Freq: Two times a day (BID) | ORAL | 0 refills | Status: DC

## 2022-02-19 MED ORDER — SODIUM CHLORIDE 0.9 % IV SOLN
50.0000 mg/m2 | Freq: Once | INTRAVENOUS | Status: AC
  Administered 2022-02-19: 100 mg via INTRAVENOUS
  Filled 2022-02-19: qty 4

## 2022-02-19 MED ORDER — SODIUM CHLORIDE 0.9 % IV SOLN
10.0000 mg | Freq: Once | INTRAVENOUS | Status: AC
  Administered 2022-02-19: 10 mg via INTRAVENOUS
  Filled 2022-02-19: qty 10

## 2022-02-19 NOTE — Progress Notes (Signed)
Patient tolerated transfusion with no complaints voiced.  Side effects with management reviewed with understanding verbalized.  Port site clean and dry with no bruising or swelling noted at site.  Good blood return noted before and after administration.  Band aid applied.  ? ?Patient tolerated chemotherapy with no complaints voiced.  Side effects with management reviewed with understanding verbalized.  Port site clean and dry with no bruising or swelling noted at site.  Good blood return noted before and after administration of chemotherapy.  Band aid applied.  Patient left in satisfactory condition with VSS and no s/s of distress noted.     ?

## 2022-02-19 NOTE — Patient Instructions (Signed)
[facility]  Discharge Instructions: ?Thank you for choosing [facility] to provide your oncology and hematology care.  ?If you have a lab appointment with the [facility], please come in thru the Main Entrance and check in at the main information desk. ? ?Wear comfortable clothing and clothing appropriate for easy access to any Portacath or PICC line.  ? ?We strive to give you quality time with your provider. You may need to reschedule your appointment if you arrive late (15 or more minutes).  Arriving late affects you and other patients whose appointments are after yours.  Also, if you miss three or more appointments without notifying the office, you may be dismissed from the clinic at the provider?s discretion.    ?  ?For prescription refill requests, have your pharmacy contact our office and allow 72 hours for refills to be completed.   ? ?Today you received the following chemotherapy and/or immunotherapy agents bendaka.    ?  ?To help prevent nausea and vomiting after your treatment, we encourage you to take your nausea medication as directed. ? ?BELOW ARE SYMPTOMS THAT SHOULD BE REPORTED IMMEDIATELY: ?*FEVER GREATER THAN 100.4 F (38 ?C) OR HIGHER ?*CHILLS OR SWEATING ?*NAUSEA AND VOMITING THAT IS NOT CONTROLLED WITH YOUR NAUSEA MEDICATION ?*UNUSUAL SHORTNESS OF BREATH ?*UNUSUAL BRUISING OR BLEEDING ?*URINARY PROBLEMS (pain or burning when urinating, or frequent urination) ?*BOWEL PROBLEMS (unusual diarrhea, constipation, pain near the anus) ?TENDERNESS IN MOUTH AND THROAT WITH OR WITHOUT PRESENCE OF ULCERS (sore throat, sores in mouth, or a toothache) ?UNUSUAL RASH, SWELLING OR PAIN  ?UNUSUAL VAGINAL DISCHARGE OR ITCHING  ? ?Items with * indicate a potential emergency and should be followed up as soon as possible or go to the Emergency Department if any problems should occur. ? ?Please show the CHEMOTHERAPY ALERT CARD or IMMUNOTHERAPY ALERT CARD at check-in to the Emergency  Department and triage nurse. ? ?Should you have questions after your visit or need to cancel or reschedule your appointment, please contact [facility] [phone]  and follow the prompts.  Office hours are 8:00 a.m. to 4:30 p.m. Monday - Friday. Please note that voicemails left after 4:00 p.m. may not be returned until the following business day.  We are closed weekends and major holidays. You have access to a nurse at all times for urgent questions. Please call the main number to the clinic [phone] and follow the prompts. ? ?For any non-urgent questions, you may also contact your provider using MyChart. We now offer e-Visits for anyone 29 and older to request care online for non-urgent symptoms. For details visit [email or web address]. ?  ?Also download the MyChart app! Go to the app store, search "MyChart", open the app, select [facility], and log in with your MyChart username and password. ? ?Due to Covid, a mask is required upon entering the hospital/clinic. If you do not have a mask, one will be given to you upon arrival. For doctor visits, patients may have 1 support person aged 105 or older with them. For treatment visits, patients cannot have anyone with them due to current Covid guidelines and our immunocompromised population.  ?

## 2022-02-19 NOTE — Progress Notes (Signed)
?Location:  [facility] ?Nursing Home Room Number: 141-W ?Place of Service:  SNF (31) ? ? ?CODE STATUS: Full Code ? ?Allergies  ?Allergen Reactions  ? Lidocaine Other (See Comments) and Hypertension  ?  Patient had chest tightness with stomach pain, numbness on left side of limbs and unable to speak  ? Rituximab-Pvvr Other (See Comments)  ?  Patient complained of chest pressure/pain ?  ? ? ?Chief Complaint  ?Patient presents with  ? Acute Visit  ?  Follow-up transfer  ? ? ?HPI: ? ?She is a 88 year old woman who has been transferred to this facility for long term placement. 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 She has recently been treated for uti. She does have some fatigue present; with dizziness present. 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 Iron deficiency anemia due to chronic blood loss: ? ?Past Medical History:  ?Diagnosis Date  ? Anxiety   ? Hypertension   ? Irritable bowel disease   ? Osteoarthritis   ? ? ?Past Surgical History:  ?Procedure Laterality Date  ? ABDOMINAL HYSTERECTOMY  1974  ? CARDIAC CATHETERIZATION    ? 1991 and 2005  ? ? ?Social History  ? ?Socioeconomic History  ? Marital status: Widowed  ?  Spouse name: Not on file  ? Number of children: Not on file  ? Years of education: Not on file  ? Highest education level: Not on file  ?Occupational History  ? Not on file  ?Tobacco Use  ? Smoking status: Never  ? Smokeless tobacco: Never  ?Vaping Use  ? Vaping Use: Never used  ?Substance and Sexual Activity  ? Alcohol use: No  ? Drug use: No  ? Sexual activity: Not on file  ?Other Topics Concern  ? Not on file  ?Social History Narrative  ? Not on file  ? ?Social Determinants of Health  ? ?Financial Resource Strain: Low Risk   ? Difficulty of Paying Living Expenses: Not very hard  ?Food Insecurity: No Food  Insecurity  ? Worried About Charity fundraiser in the Last Year: Never true  ? Ran Out of Food in the Last Year: Never true  ?Transportation Needs: No Transportation Needs  ? Lack of Transportation (Medical): No  ? Lack of Transportation (Non-Medical): No  ?Physical Activity: Insufficiently Active  ? Days of Exercise per Week: 2 days  ? Minutes of Exercise per Session: 10 min  ?Stress: Stress Concern Present  ? Feeling of Stress : To some extent  ?Social Connections: Moderately Integrated  ? Frequency of Communication with Friends and Family: More than three times a week  ? Frequency of Social Gatherings with Friends and Family: Twice a week  ? Attends Religious Services: More than 4 times per year  ? Active Member of Clubs or Organizations: Yes  ? Attends Archivist Meetings: 1 to 4 times per year  ? Marital Status: Widowed  ?Intimate Partner Violence: Not At Risk  ? Fear of Current or Ex-Partner: No  ? Emotionally Abused: No  ? Physically Abused: No  ? Sexually Abused: No  ? ?Family History  ?Problem Relation Age of Onset  ? Renal Disease Sister   ? Multiple myeloma Sister   ? ? ? ? ?VITAL SIGNS ?BP 128/73   Pulse 95   Temp (!) 97.3 ?F (  36.3 ?C)   Resp 20   Wt 158 lb (71.7 kg)   BMI 30.35 kg/m?  ? ?Outpatient Encounter Medications as of 02/19/2022  ?Medication Sig  ? allopurinol (ZYLOPRIM) 300 MG tablet Take 1 tablet (300 mg total) by mouth daily.  ? furosemide (LASIX) 20 MG tablet Take 20 mg by mouth daily.  ? LORazepam (ATIVAN) 0.5 MG tablet Take 1 tablet (0.5 mg total) by mouth 2 (two) times daily. (Patient taking differently: Take 0.5 mg by mouth. Before Meals and At Bedtime)  ? losartan-hydrochlorothiazide (HYZAAR) 100-25 MG tablet Take 1 tablet by mouth daily.  ? metoprolol tartrate (LOPRESSOR) 25 MG tablet Take 25 mg by mouth 2 (two) times daily.  ? mirtazapine (REMERON) 7.5 MG tablet Take 7.5 mg by mouth at bedtime.  ? potassium chloride (MICRO-K) 10 MEQ CR capsule Take 10 mEq by mouth daily.   ? prochlorperazine (COMPAZINE) 10 MG tablet Take 1 tablet (10 mg total) by mouth every 6 (six) hours as needed for nausea or vomiting.  ? acetaminophen (TYLENOL) 500 MG tablet Take 1,000 mg by mouth every 6 (six) hours as needed for moderate pain.  ? BENDAMUSTINE HCL IV Inject into the vein every 28 (twenty-eight) days. Days 1&2 every 28 days  ? RITUXIMAB IV Inject into the vein every 28 (twenty-eight) days.  ? ?Facility-Administered Encounter Medications as of 02/19/2022  ?Medication  ? 0.9 %  sodium chloride infusion (Manually program via Guardrails IV Fluids)  ? acetaminophen (TYLENOL) tablet 650 mg  ? diphenhydrAMINE (BENADRYL) capsule 25 mg  ? ? ? ?SIGNIFICANT DIAGNOSTIC EXAMS ? ?LAB REVIEWED:  ? ?02-18-22: wbc 61.7; hgb 7.7; hct 23.5; mcv 102.6 plt 157; glucose 110; bun 20; creat 1.35; k+ 4.3; na++ 132; ca 8.3; GFR 3.8; mag 2.0 phos 3.6; protein 6.0; albumin 3.5 uric acid 5.5  ? ?Review of Systems  ?Constitutional:  Positive for malaise/fatigue.  ?Respiratory:  Negative for cough and shortness of breath.   ?Cardiovascular:  Negative for chest pain, palpitations and leg swelling.  ?Gastrointestinal:  Negative for abdominal pain, constipation and heartburn.  ?Musculoskeletal:  Negative for back pain, joint pain and myalgias.  ?Skin: Negative.   ?Neurological:  Positive for dizziness.  ?Psychiatric/Behavioral:  The patient has insomnia. The patient is not nervous/anxious.   ? ?Physical Exam ?Constitutional:   ?   General: She is not in acute distress. ?   Appearance: She is well-developed. She is not diaphoretic.  ?Neck:  ?   Thyroid: No thyromegaly.  ?Cardiovascular:  ?   Rate and Rhythm: Normal rate and regular rhythm.  ?   Pulses: Normal pulses.  ?   Heart sounds: Normal heart sounds.  ?Pulmonary:  ?   Effort: Pulmonary effort is normal. No respiratory distress.  ?   Breath sounds: Normal breath sounds.  ?Abdominal:  ?   General: Bowel sounds are normal. There is no distension.  ?   Palpations: Abdomen is soft.   ?   Tenderness: There is no abdominal tenderness.  ?Musculoskeletal:     ?   General: Normal range of motion.  ?   Cervical back: Neck supple.  ?   Right lower leg: Edema present.  ?   Left lower leg: Edema present.  ?   Comments: 2-3 + bilateral lower extremity edema   ?Lymphadenopathy:  ?   Cervical: No cervical adenopathy.  ?Skin: ?   General: Skin is warm and dry.  ?Neurological:  ?   Mental Status: She is alert and oriented to  person, place, and time.  ?Psychiatric:     ?   Mood and Affect: Mood normal.  ? ? ? ? ?ASSESSMENT/ PLAN: ? ?TODAY ? ?Stage 3b chronic kidney disease: bun 20; creat 1.35; gfr 38 ? ?2.  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 ?3.  Bilateral lower extremity edema: is without change: will continue lasix 20 mg daily with k+ 10 meq daily  ? ?4. Iron deficiency anemia due to chronic blood loss: hgb 7.7; is [facility] ? ?5. Aortic atherosclerosis: (ct 02-06-21) will monitor  ? ?6. Major depression recurrent chronic: is on remeron 7.5 mg nightly and ativan 0.5 mg twice daily  ? ?7. 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 ?8. Mantle cell lymphoma of lymph nodes of multiple regions.  ? ? ?Ok Edwards NP ?[facility]  ?call [phone]  ? ?

## 2022-02-22 ENCOUNTER — Non-Acute Institutional Stay (SKILLED_NURSING_FACILITY): Payer: Medicare Other | Admitting: Internal Medicine

## 2022-02-22 ENCOUNTER — Encounter: Payer: Self-pay | Admitting: Internal Medicine

## 2022-02-22 DIAGNOSIS — C8318 Mantle cell lymphoma, lymph nodes of multiple sites: Secondary | ICD-10-CM

## 2022-02-22 DIAGNOSIS — D649 Anemia, unspecified: Secondary | ICD-10-CM

## 2022-02-22 DIAGNOSIS — N1832 Chronic kidney disease, stage 3b: Secondary | ICD-10-CM | POA: Diagnosis not present

## 2022-02-22 DIAGNOSIS — M1A30X0 Chronic gout due to renal impairment, unspecified site, without tophus (tophi): Secondary | ICD-10-CM

## 2022-02-22 LAB — TYPE AND SCREEN
ABO/RH(D): A POS
Antibody Screen: NEGATIVE
Unit division: 0

## 2022-02-22 LAB — BPAM RBC
Blood Product Expiration Date: 202306082359
ISSUE DATE / TIME: 202305051204
Unit Type and Rh: 6200

## 2022-02-22 LAB — SURGICAL PATHOLOGY

## 2022-02-22 NOTE — Assessment & Plan Note (Addendum)
H/H was 7.7/23.5 in the context of rectal bleeding.  She was transfused 5/5.  She continues to have profound fatigue.  No active bleeding dyscrasias reported at this time by her or by staff. ?

## 2022-02-22 NOTE — Progress Notes (Signed)
? ?NURSING HOME LOCATION:  [facility] ?ROOM NUMBER:  76 W ? ?CODE STATUS:  to be determined ? ?PCP: Ok Edwards NP,PSC ? ?This is a comprehensive admission note to this SNFperformed on this date less than 30 days from date of admission. ?Included are preadmission medical/surgical history; reconciled medication list; family history; social history and comprehensive review of systems.  ?Corrections and additions to the records were documented. Comprehensive physical exam was also performed. Additionally a clinical summary was entered for each active diagnosis pertinent to this admission in the Problem List to enhance continuity of care. ? ?HPI: She transferred to this facility to be a permanent residence as she has 2 sisters who live here.  She is being actively treated for stage IVb mantle cell lymphoma with intermittent IV iron (Venofer).  ?This is in context of stage IIIb CKD, IBS, essential hypertension, history of gout, and iron deficiency anemia due to chronic blood loss. ?It was reported that that she did have some rectal bleeding following the infusion 5/5. ?Past surgical history cardiac cath x2 and abdominal hysterectomy. ? ?Labs were performed 02/19/2022 and revealed a creatinine of 1.94 with a GFR of 24 indicating stage IV CKD.  Albumin was mildly reduced at 3.4 and total protein was 5.7.  Uric acid was less than 1.5. ?White blood count was 61,700.  H/H was 7.7/23.5 with an MCV of 102.6.  B12 level was last checked 09/21/2021 and was normal at 511. ? ?Social history: Non drinker; non-smoker. ? ?Family history: Noncontributory due to advanced age. ?  ?Review of systems: Reported symptoms include fatigue and night sweats.  She also reports poor sleep hygiene.  She did have severe diarrhea following a 7-day course of Bactrim for UTI.  She also describes anorexia.  States she has difficulty eating as she is missing so many posterior teeth. ?She has had constipation since the rectal bleeding  ; she states that was her last bowel movement of any kind. ?She complains of severe knee pain and states that she has "no kneecaps". ?When I ask what condition she has she stated "white corpuscles eating up red corpuscles".  When asked how this was being treated she stated "not that I know of". ?She has a corn on her right toe which is trimmed periodically. ? ?Constitutional: No fever, significant weight change  ?Eyes: No redness, discharge, pain, vision change ?ENT/mouth: No nasal congestion, purulent discharge, earache, change in hearing, sore throat  ?Cardiovascular: No chest pain, palpitations, paroxysmal nocturnal dyspnea, claudication, edema  ?Respiratory: No cough, sputum production, hemoptysis, DOE, significant snoring, apnea  ?Gastrointestinal: No heartburn, dysphagia, abdominal pain, nausea /vomiting, melena ?Genitourinary: No dysuria, hematuria, pyuria, incontinence, nocturia ?Dermatologic: No rash, pruritus, change in appearance of skin ?Neurologic: No dizziness, headache, syncope, seizures, numbness, tingling ?Psychiatric: No significant anxiety, depression, anorexia ?Endocrine: No change in hair/skin/nails, excessive thirst, excessive hunger, excessive urination  ?Hematologic/lymphatic: No significant bruising, lymphadenopathy ?Allergy/immunology: No itchy/watery eyes, significant sneezing, urticaria, angioedema ? ?Physical exam:  ?Pertinent or positive findings: She appears her stated age.  Facies are blank and affect is flat.  She has bilateral ptosis.  There is intermittent exotropia of the right eye.  She is missing multiple  teeth superiorly and inferiorly.  First heart sound is increased.  Breath sounds are decreased at the bases.  Pedal pulses are not palpable.  She has tense 2+ edema of the lower extremities.  Fusiform changes of the knees are present.  Interosseous wasting of the hands is also noted. ? ?  General appearance: no acute distress, increased work of breathing is present.    ?Lymphatic: No lymphadenopathy about the head, neck, axilla. ?Eyes: No conjunctival inflammation or lid edema is present. There is no scleral icterus. ?Ears:  External ear exam shows no significant lesions or deformities.   ?Nose:  External nasal examination shows no deformity or inflammation. Nasal mucosa are pink and moist without lesions, exudates ?Neck:  No thyromegaly, masses, tenderness noted.    ?Heart:  Normal rate and regular rhythm. S2 normal without gallop, murmur, click, rub.  ?Lungs:  without wheezes, rhonchi, rales, rubs. ?Abdomen: Bowel sounds are normal.  Abdomen is soft and nontender with no organomegaly, hernias, masses. ?GU: Deferred  ?Extremities:  No cyanosis, clubbing ?Neurologic exam:  Balance, Rhomberg, finger to nose testing could not be completed due to clinical state ?Skin: Warm & dry w/o tenting. ?No significant lesions or rash. ? ?See clinical summary under each active problem in the Problem List with associated updated therapeutic plan ? ?

## 2022-02-22 NOTE — Assessment & Plan Note (Addendum)
Current uric acid is less than 1.5.  With stage IV CKD; allopurinol dose should be less than 50 mg every other day.  It is possible that it could be discontinued totally in view of this uric acid level.  She has no active gout symptoms. ?

## 2022-02-22 NOTE — Assessment & Plan Note (Addendum)
Present treatment plan for non-Hodgkin's lymphoma is rituximab D 1+ Bendamustine D1, 2 every 28 days x 6 cycles as per Oncology ?

## 2022-02-22 NOTE — Patient Instructions (Signed)
See assessment and plan under each diagnosis in the problem list and acutely for this visit 

## 2022-02-22 NOTE — Assessment & Plan Note (Signed)
Current creatinine is 1.94 with GFR 24 indicating CKD stage IV.  With GFR are less than 30; allopurinol dose should be 50 mg every other day. ?

## 2022-02-24 NOTE — Progress Notes (Signed)
? ?[facility] ?[address] ?[place], [name] [address] ? ? ?CLINIC:  ?Medical Oncology/Hematology ? ?PCP:  ?Gerlene Fee, NP ?[address] [place] [address] ?[phone] ? ? ?REASON FOR VISIT:   Stage IVb mantle cell lymphoma, new start chemotherapy follow-up visit ? ?CURRENT THERAPY: Bendamustine and rituximab ? ?LAST TREATMENT DATE: D1/C1 02/18/2022 ? ?BRIEF ONCOLOGIC HISTORY:  ?Oncology History  ?Mantle cell lymphoma ([place])  ?02/16/2022 Initial Diagnosis  ? Mantle cell lymphoma ([place]) ? ?  ?02/18/2022 -  Chemotherapy  ? Patient is on Treatment Plan : NON-HODGKINS LYMPHOMA Rituximab D1 + Bendamustine D1,2 q28d x 6 cycles  ? ?   ? ? ?CANCER STAGING: ? Cancer Staging  ?Mantle cell lymphoma ([place]) ?Staging form: Hodgkin and Non-Hodgkin Lymphoma, AJCC 8th Edition ?- Clinical stage from 02/16/2022: Stage IV - Unsigned ? ? ?INTERVAL HISTORY:  ?[name], a 88 y.o. female, is managed by Dr. Delton Coombes for stage IVb mantle cell lymphoma.  She received first cycle of treatment with Bendamustine and rituximab on 02/18/2022. She is seen today for toxicity check and follow-up of new start chemo. ? ?Her functional status has been declining rapidly since diagnosis, even prior to starting chemotherapy, and she was recently placed in SNF [facility]) for long-term placement as of 02/19/2022. ? ?She notes increased fatigue after she started chemotherapy.  She presents today in a wheelchair.  She reports constipation, states that she has not had a bowel movement in the last week.  She denies any nausea, vomiting, diarrhea, mouth sores, or new pain. ? ?She has had poor appetite, and reports that she has not been able to drink any Ensure since she became a resident at [facility].  She reports that she is avoiding drinking water because she "pees all the time" from her Lasix.  She has lost 8 pounds in the past week. ? ? ?REVIEW OF SYSTEMS:  ?Review of Systems  ?Constitutional:  Positive for appetite  change, fatigue and unexpected weight change. Negative for chills, diaphoresis and fever.  ?HENT:   Negative for lump/mass and nosebleeds.   ?Eyes:  Negative for eye problems.  ?Respiratory:  Negative for cough, hemoptysis and shortness of breath.   ?Cardiovascular:  Positive for palpitations. Negative for chest pain and leg swelling.  ?Gastrointestinal:  Positive for constipation. Negative for abdominal pain, blood in stool, diarrhea, nausea and vomiting.  ?Genitourinary:  Positive for frequency. Negative for hematuria.   ?Skin: Negative.   ?Neurological:  Positive for dizziness, headaches and numbness. Negative for light-headedness.  ?Hematological:  Does not bruise/bleed easily.  ?Psychiatric/Behavioral:  Positive for depression.   ? ?PAST MEDICAL/SURGICAL HISTORY:  ?Past Medical History:  ?Diagnosis Date  ? Anxiety   ? Hypertension   ? Irritable bowel disease   ? Osteoarthritis   ? ?Past Surgical History:  ?Procedure Laterality Date  ? ABDOMINAL HYSTERECTOMY  1974  ? CARDIAC CATHETERIZATION    ? 1991 and 2005  ? ? ?SOCIAL HISTORY:  ?Social History  ? ?Socioeconomic History  ? Marital status: Widowed  ?  Spouse name: Not on file  ? Number of children: Not on file  ? Years of education: Not on file  ? Highest education level: Not on file  ?Occupational History  ? Not on file  ?Tobacco Use  ? Smoking status: Never  ? Smokeless tobacco: Never  ?Vaping Use  ? Vaping Use: Never used  ?Substance and Sexual Activity  ? Alcohol use: No  ? Drug  use: No  ? Sexual activity: Not on file  ?Other Topics Concern  ? Not on file  ?Social History Narrative  ? Not on file  ? ?Social Determinants of Health  ? ?Financial Resource Strain: Low Risk   ? Difficulty of Paying Living Expenses: Not very hard  ?Food Insecurity: No Food Insecurity  ? Worried About Charity fundraiser in the Last Year: Never true  ? Ran Out of Food in the Last Year: Never true  ?Transportation Needs: No Transportation Needs  ? Lack of Transportation (Medical):  No  ? Lack of Transportation (Non-Medical): No  ?Physical Activity: Insufficiently Active  ? Days of Exercise per Week: 2 days  ? Minutes of Exercise per Session: 10 min  ?Stress: Stress Concern Present  ? Feeling of Stress : To some extent  ?Social Connections: Moderately Integrated  ? Frequency of Communication with Friends and Family: More than three times a week  ? Frequency of Social Gatherings with Friends and Family: Twice a week  ? Attends Religious Services: More than 4 times per year  ? Active Member of Clubs or Organizations: Yes  ? Attends Archivist Meetings: 1 to 4 times per year  ? Marital Status: Widowed  ?Intimate Partner Violence: Not At Risk  ? Fear of Current or Ex-Partner: No  ? Emotionally Abused: No  ? Physically Abused: No  ? Sexually Abused: No  ? ? ?FAMILY HISTORY: Family history has been reviewed by me and is documented elsewhere in the electronic medical record. ? ?CURRENT MEDICATIONS: Current medications have been reviewed by me and are documented elsewhere in electronic medical record. ? ?ALLERGIES: Drug allergies have been reviewed by me and are documented elsewhere in the electronic medical record. ? ?PHYSICAL EXAM:  ?Performance status (ECOG): 3 - Symptomatic, >50% confined to bed ?Vitals:  ? 02/25/22 1141  ?BP: (!) 119/55  ?Pulse: 83  ?Resp: 18  ?Temp: 98.1 ?F (36.7 ?C)  ?SpO2: 100%  ? ?Wt Readings from Last 3 Encounters:  ?02/25/22 153 lb 11.2 oz (69.7 kg)  ?02/22/22 161 lb 9.6 oz (73.3 kg)  ?02/19/22 158 lb (71.7 kg)  ? ?Physical Exam ?Vitals reviewed.  ?Constitutional:   ?   Appearance: Normal appearance.  ?   Comments: In wheelchair  ?Cardiovascular:  ?   Rate and Rhythm: Normal rate and regular rhythm.  ?   Pulses: Normal pulses.  ?   Heart sounds: Normal heart sounds.  ?Pulmonary:  ?   Effort: Pulmonary effort is normal.  ?   Breath sounds: Normal breath sounds.  ?Musculoskeletal:  ?   Right lower leg: 1+ Edema present.  ?   Left lower leg: 1+ Edema present.   ?Neurological:  ?   General: No focal deficit present.  ?   Mental Status: She is alert and oriented to person, place, and time.  ?Psychiatric:     ?   Mood and Affect: Mood normal.     ?   Behavior: Behavior normal.  ?  ? ?LABORATORY DATA: Relevant labs have been reviewed by me and are documented elsewhere in the electronic medical record. ? ?DIAGNOSTIC IMAGING: Relevant imaging has been reviewed by me and are documented elsewhere in the electronic medical record. ? ?ASSESSMENT & PLAN: ?1.  Stage IVb mantle cell lymphoma ?- Primary oncologist is Dr. Delton Coombes ?- Received first cycle of treatment with Bendamustine and rituximab on 02/18/2022 ?- She has had worsening fatigue with chemotherapy, continues to have decrease in functional status ?-  CBC (02/25/22 ): WBC improved at 18.5, Hgb stable 7.7, platelets 117. ?- CMP (02/25/22): LFTs normal, creatinine stable at 1.52.  Potassium 5.0, magnesium 2.2. ?- PLAN:  She will see me for follow-up and symptom management visit next week on 03/04/2022 ?- Patient is scheduled for labs, MD visit with Dr. Delton Coombes, and next cycle of treatment on 03/18/2022  ? ?2.  TLS prophylaxis ?- She is taking allopurinol daily.  She was given rasburicase with cycle 1. ?- Labs today (02/25/2022): Normal phosphorus 3.3, normal uric acid 3.0, improved LDH 208, normal magnesium 2.2, potassium at upper limits of normal at 5.0 ?- PLAN: Continue allopurinol ?- Due to borderline high potassium, we will STOP patient's daily potassium supplement for the time being   ? ?3.  Normocytic anemia: ?- CBC shows low but stable Hgb 7.7, due to infiltration from lymphoma. ? ?4.  Lower extremity swelling: ?-Swelling is improved, now 1+ pitting edema bilaterally ?- PLAN: Continue Lasix 20 mg daily ? ?5.  Constipation ?- Reports that she has been constipated for the past week, ever since receiving chemotherapy ?- PLAN: Recommend Colace 100 mg nightly, can increase to 2 capsules nightly if needed ? ?6.  Nutrition &  hydration ?- Weight today is down 8 pounds in the past week ?- She has not been receiving any nutritional supplements at SNF ?- Reports poor appetite ?- PLAN: We will refer to dietitian due to weight loss ?- Encour

## 2022-02-25 ENCOUNTER — Inpatient Hospital Stay (HOSPITAL_COMMUNITY): Payer: Medicare Other

## 2022-02-25 ENCOUNTER — Inpatient Hospital Stay (HOSPITAL_BASED_OUTPATIENT_CLINIC_OR_DEPARTMENT_OTHER): Payer: Medicare Other | Admitting: Physician Assistant

## 2022-02-25 VITALS — BP 119/55 | HR 83 | Temp 98.1°F | Resp 18 | Ht 64.0 in | Wt 153.7 lb

## 2022-02-25 DIAGNOSIS — Z79899 Other long term (current) drug therapy: Secondary | ICD-10-CM | POA: Diagnosis not present

## 2022-02-25 DIAGNOSIS — C8318 Mantle cell lymphoma, lymph nodes of multiple sites: Secondary | ICD-10-CM

## 2022-02-25 DIAGNOSIS — I10 Essential (primary) hypertension: Secondary | ICD-10-CM | POA: Diagnosis not present

## 2022-02-25 DIAGNOSIS — Z5111 Encounter for antineoplastic chemotherapy: Secondary | ICD-10-CM | POA: Diagnosis not present

## 2022-02-25 DIAGNOSIS — M7989 Other specified soft tissue disorders: Secondary | ICD-10-CM | POA: Diagnosis not present

## 2022-02-25 DIAGNOSIS — M1990 Unspecified osteoarthritis, unspecified site: Secondary | ICD-10-CM | POA: Diagnosis not present

## 2022-02-25 DIAGNOSIS — N390 Urinary tract infection, site not specified: Secondary | ICD-10-CM | POA: Diagnosis not present

## 2022-02-25 DIAGNOSIS — R61 Generalized hyperhidrosis: Secondary | ICD-10-CM | POA: Diagnosis not present

## 2022-02-25 DIAGNOSIS — C9110 Chronic lymphocytic leukemia of B-cell type not having achieved remission: Secondary | ICD-10-CM | POA: Diagnosis not present

## 2022-02-25 DIAGNOSIS — Z9221 Personal history of antineoplastic chemotherapy: Secondary | ICD-10-CM | POA: Diagnosis not present

## 2022-02-25 DIAGNOSIS — Z806 Family history of leukemia: Secondary | ICD-10-CM | POA: Diagnosis not present

## 2022-02-25 DIAGNOSIS — K5900 Constipation, unspecified: Secondary | ICD-10-CM | POA: Diagnosis not present

## 2022-02-25 DIAGNOSIS — R634 Abnormal weight loss: Secondary | ICD-10-CM | POA: Diagnosis not present

## 2022-02-25 DIAGNOSIS — D649 Anemia, unspecified: Secondary | ICD-10-CM | POA: Diagnosis not present

## 2022-02-25 DIAGNOSIS — R5383 Other fatigue: Secondary | ICD-10-CM | POA: Diagnosis not present

## 2022-02-25 LAB — CBC WITH DIFFERENTIAL/PLATELET
Abs Immature Granulocytes: 0.06 10*3/uL (ref 0.00–0.07)
Basophils Absolute: 0.1 10*3/uL (ref 0.0–0.1)
Basophils Relative: 1 %
Eosinophils Absolute: 0.3 10*3/uL (ref 0.0–0.5)
Eosinophils Relative: 2 %
HCT: 24.4 % — ABNORMAL LOW (ref 36.0–46.0)
Hemoglobin: 7.7 g/dL — ABNORMAL LOW (ref 12.0–15.0)
Immature Granulocytes: 0 %
Lymphocytes Relative: 67 %
Lymphs Abs: 12.6 10*3/uL — ABNORMAL HIGH (ref 0.7–4.0)
MCH: 32.5 pg (ref 26.0–34.0)
MCHC: 31.6 g/dL (ref 30.0–36.0)
MCV: 103 fL — ABNORMAL HIGH (ref 80.0–100.0)
Monocytes Absolute: 1.6 10*3/uL — ABNORMAL HIGH (ref 0.1–1.0)
Monocytes Relative: 9 %
Neutro Abs: 3.9 10*3/uL (ref 1.7–7.7)
Neutrophils Relative %: 21 %
Platelets: 117 10*3/uL — ABNORMAL LOW (ref 150–400)
RBC: 2.37 MIL/uL — ABNORMAL LOW (ref 3.87–5.11)
RDW: 16.5 % — ABNORMAL HIGH (ref 11.5–15.5)
WBC: 18.5 10*3/uL — ABNORMAL HIGH (ref 4.0–10.5)
nRBC: 0 % (ref 0.0–0.2)

## 2022-02-25 LAB — LACTATE DEHYDROGENASE: LDH: 208 U/L — ABNORMAL HIGH (ref 98–192)

## 2022-02-25 LAB — SAMPLE TO BLOOD BANK

## 2022-02-25 LAB — COMPREHENSIVE METABOLIC PANEL
ALT: 15 U/L (ref 0–44)
AST: 27 U/L (ref 15–41)
Albumin: 3.5 g/dL (ref 3.5–5.0)
Alkaline Phosphatase: 44 U/L (ref 38–126)
Anion gap: 5 (ref 5–15)
BUN: 37 mg/dL — ABNORMAL HIGH (ref 8–23)
CO2: 25 mmol/L (ref 22–32)
Calcium: 8.4 mg/dL — ABNORMAL LOW (ref 8.9–10.3)
Chloride: 108 mmol/L (ref 98–111)
Creatinine, Ser: 1.52 mg/dL — ABNORMAL HIGH (ref 0.44–1.00)
GFR, Estimated: 33 mL/min — ABNORMAL LOW (ref >60)
Glucose, Bld: 113 mg/dL — ABNORMAL HIGH (ref 70–99)
Potassium: 5 mmol/L (ref 3.5–5.1)
Sodium: 138 mmol/L (ref 135–145)
Total Bilirubin: 0.6 mg/dL (ref 0.3–1.2)
Total Protein: 5.6 g/dL — ABNORMAL LOW (ref 6.5–8.1)

## 2022-02-25 LAB — URIC ACID: Uric Acid, Serum: 3 mg/dL (ref 2.5–7.1)

## 2022-02-25 LAB — PHOSPHORUS: Phosphorus: 3.3 mg/dL (ref 2.5–4.6)

## 2022-02-25 LAB — MAGNESIUM: Magnesium: 2.2 mg/dL (ref 1.7–2.4)

## 2022-02-25 MED ORDER — DOCUSATE SODIUM 100 MG PO CAPS: 100.0000 mg | ORAL_CAPSULE | Freq: Every evening | ORAL | 0 refills | Status: DC

## 2022-02-25 NOTE — Patient Instructions (Signed)
[place] at [facility] ?Discharge Instructions ? ?You were seen today by Tarri Abernethy PA-C for your chemotherapy follow-up visit. ? ?CONSTIPATION: Take Colace 100 mg capsule every night.  You can increase to 2 capsules each night if you are still having significant constipation.  You should stop this medication if you start having any diarrhea. ? ?WEIGHT LOSS: Drink Ensure 3 times each day.  We will refer you to cancer center dietitian.  You should drink at least 1.5 L of water daily. ? ?MEDICATIONS: ?-Continue allopurinol ?- STOP potassium supplement ? ?FOLLOW-UP APPOINTMENT: Follow-up visit on 03/04/2022 ? ? ?Thank you for choosing [place] at [facility] to provide your oncology and hematology care.  To afford each patient quality time with our provider, please arrive at least 15 minutes before your scheduled appointment time.  ? ?If you have a lab appointment with the [facility] please come in thru the Main Entrance and check in at the main information desk. ? ?You need to re-schedule your appointment should you arrive 10 or more minutes late.  We strive to give you quality time with our providers, and arriving late affects you and other patients whose appointments are after yours.  Also, if you no show three or more times for appointments you may be dismissed from the clinic at the providers discretion.     ?Again, thank you for choosing [facility].  Our hope is that these requests will decrease the amount of time that you wait before being seen by our physicians.       ?_____________________________________________________________ ? ?Should you have questions after your visit to [facility], please contact our office at [phone] and follow the prompts.  Our office hours are 8:00 a.m. and 4:30 p.m. Monday - Friday.  Please note that voicemails left after 4:00 p.m. may not be returned until the following business day.  We  are closed weekends and major holidays.  You do have access to a nurse 24-7, just call the main number to the clinic [phone] and do not press any options, hold on the line and a nurse will answer the phone.   ? ?For prescription refill requests, have your pharmacy contact our office and allow 72 hours.   ? ?Due to Covid, you will need to wear a mask upon entering the hospital. If you do not have a mask, a mask will be given to you at the Main Entrance upon arrival. For doctor visits, patients may have 1 support person age 59 or older with them. For treatment visits, patients can not have anyone with them due to social distancing guidelines and our immunocompromised population.  ? ? ? ?

## 2022-03-01 ENCOUNTER — Inpatient Hospital Stay (HOSPITAL_COMMUNITY): Payer: Medicare Other | Admitting: Dietician

## 2022-03-01 NOTE — Progress Notes (Signed)
Nutrition Assessment ? ? ?Reason for Assessment: Provider referral ? ? ?ASSESSMENT: 89 year old female with stage IV mantle cell lymphoma and normocytic anemia.  She is currently receiving Bendamustine + Rituximab q28d (first treatment 5/4) as well as intermittent VenoferPatient is under the care of Dr. Delton Coombes.  ? ?Past medical history includes anxiety, HTN, IBD, osteoarthritis, CKD stage III, HTN, IDA, BLE, chronic gout. ? ?Patient transferred to [facility] on 5/05 for long term placement. Met with patient in clinic. She reports adjusting well to nursing center. Patient shares she has 2 sisters that reside there as well. Patient reports she was given a laxative this morning. This "cleaned me out like you wouldn't believe" Patient reports afraid to eat lunch, but had a few bites and did not have to go to the bathroom. Patient reports adjusting to the food. She provides a few likes/dislikes that this RD shared with [facility] RD this afternoon. Patient reports not eating much at meals is usual for her, but misses her own cooking. Patient is currently ordered to receive 2 Ensure daily. Per pt there have been occassions she has not received them, they have been poured into a cup filled with ice, or received a small amount in a medicine cup. She likes the strawberry flavor best. Patient is taking lasix for chronic BLE. She reports this makes her have to "pee all the time" especially at night. Patient "feels bad" about having to request help to the bathroom multiple times. Patient denies nausea, vomiting.  ? ? ?Nutrition Focused Physical Exam:  ? ?Orbital Region: moderate ?Buccal Region: moderate ?Upper Arm Region: moderate ?Thoracic and Lumbar Region: UTA ?Temple Region: mild  ?Clavicle Bone Region: severe  ?Shoulder and Acromion Bone Region: moderate ?Scapular Bone Region: UTA ?Dorsal Hand: severe ?Patellar Region: UTA (edema) ?Anterior Thigh Region: UTA (edema) ?Posterior Calf Region: UTA (edema) ?Edema (RD  assessment): +1 BLE ?Hair: reviewed  ?Eyes: reviewed  ?Mouth: UTA ?Skin: reviewed ?Nails: reviewed ? ?Medications: colace, ativan, compazine, remeron, lopressor, lasix, zyloprim ? ? ?Labs: 5/11 - glucose 113, BUN 37, Cr 1.52, Ca 8.4, Albumin 3.5 (WNL) ? ? ?Anthropometrics: per chart, weights have decreased 5% (8 lbs) in 3 days. She weighed 161 lb 9.6 oz on 5/8 (suspect some loss r/t BLE on lasix) ? ?Height: 5'4" ?Weight: 153 lb 11.2 oz (5/11) ?UBW: 155-160 lb (per pt) ?BMI: 26.38 ? ? ?NUTRITION DIAGNOSIS: Inadequate oral intake related to cancer and associated treatments as evidenced by reported poor baseline appetite, fatigue, constipation, 25-75% oral intake per inpt RD report ? ? ?INTERVENTION:  ?Educated on importance of adequate calories and protein to maintain weight/strength ?Discussed with [facility] RD pt food preferences/concerns ?Continue Ensure Plus/equivalent twice daily  ?Contact information provided  ? ? ? ?MONITORING, EVALUATION, GOAL: patient will tolerate increased calories and protein to minimize weight loss  ? ? ?Next Visit: Thursday June 1 during infusion ? ? ? ? ? ? ?

## 2022-03-02 ENCOUNTER — Ambulatory Visit (INDEPENDENT_AMBULATORY_CARE_PROVIDER_SITE_OTHER): Payer: Medicare Other | Admitting: Surgery

## 2022-03-02 ENCOUNTER — Encounter: Payer: Self-pay | Admitting: Surgery

## 2022-03-02 VITALS — BP 92/48 | HR 101 | Temp 98.8°F | Resp 16 | Ht 64.0 in | Wt 153.0 lb

## 2022-03-02 DIAGNOSIS — C8318 Mantle cell lymphoma, lymph nodes of multiple sites: Secondary | ICD-10-CM | POA: Diagnosis not present

## 2022-03-03 NOTE — Progress Notes (Signed)
[place], [place] [address]   CLINIC:  Medical Oncology/Hematology  PCP:  Hendricks Limes, MD [address] [place] [address] [phone]   REASON FOR VISIT:   Stage IVb mantle cell lymphoma, new start chemotherapy follow-up visit  CURRENT THERAPY: Bendamustine and rituximab   LAST TREATMENT DATE: D1/C1 02/18/2022  BRIEF ONCOLOGIC HISTORY:  Oncology History  Mantle cell lymphoma ([place])  02/16/2022 Initial Diagnosis   Mantle cell lymphoma ([place])    02/18/2022 -  Chemotherapy   Patient is on Treatment Plan : NON-HODGKINS LYMPHOMA Rituximab D1 + Bendamustine D1,2 q28d x 6 cycles        CANCER STAGING: Cancer Staging  Mantle cell lymphoma ([place]) Staging form: Hodgkin and Non-Hodgkin Lymphoma, AJCC 8th Edition - Clinical stage from 02/16/2022: Stage IV - Unsigned   INTERVAL HISTORY:  Ms. BARBARITA Faith Lee, a 88 y.o. female, is managed by Dr. Delton Coombes for stage IVb mantle cell lymphoma.  She received first cycle of treatment with Bendamustine and rituximab on 02/18/2022. She is seen today for toxicity check and follow-up of new start chemo.  She was seen for symptom management visit last week on 02/25/2022 by Tarri Abernethy PA-C.  Her functional status has been declining rapidly since diagnosis, even prior to starting chemotherapy, and she was recently placed in SNF [facility]) for long-term placement as of 02/19/2022.  At today's visit, she does not remember that she previously had chemotherapy.  She does report that she has increasing fatigue with little to no energy.  She presents in wheelchair today.  She reports that her constipation has been better after she was started on Colace, and she is now having daily bowel movements.  She denies any nausea, vomiting, diarrhea, mouth sores, or new pain.  Her weight today is 151 pounds, which is down 2 pounds from last week.  She continues to have poor appetite (about 50%), but is trying to  drink her 2 Ensure beverages daily at [facility].  She reports that she "does not drink much water."   REVIEW OF SYSTEMS:  Review of Systems  Constitutional:  Positive for appetite change, fatigue and unexpected weight change. Negative for chills, diaphoresis and fever.  HENT:   Negative for lump/mass and nosebleeds.   Eyes:  Positive for eye problems.  Respiratory:  Negative for cough, hemoptysis and shortness of breath.   Cardiovascular:  Negative for chest pain, leg swelling and palpitations.  Gastrointestinal:  Positive for constipation. Negative for abdominal pain, blood in stool, diarrhea, nausea and vomiting.  Genitourinary:  Positive for difficulty urinating. Negative for hematuria.   Skin: Negative.   Neurological:  Positive for dizziness, headaches and numbness. Negative for light-headedness.  Hematological:  Does not bruise/bleed easily.  Psychiatric/Behavioral:  Positive for sleep disturbance.    PAST MEDICAL/SURGICAL HISTORY:  Past Medical History:  Diagnosis Date   Anxiety    Hypertension    Irritable bowel disease    Osteoarthritis    Past Surgical History:  Procedure Laterality Date   ABDOMINAL HYSTERECTOMY  1974   CARDIAC CATHETERIZATION     1991 and 2005    SOCIAL HISTORY:  Social History   Socioeconomic History   Marital status: Widowed    Spouse name: Not on file   Number of children: Not on file   Years of education: Not on file   Highest education level: Not on file  Occupational History   Not on file  Tobacco Use  Smoking status: Never   Smokeless tobacco: Never  Vaping Use   Vaping Use: Never used  Substance and Sexual Activity   Alcohol use: No   Drug use: No   Sexual activity: Not on file  Other Topics Concern   Not on file  Social History Narrative   Not on file   Social Determinants of Health   Financial Resource Strain: Low Risk    Difficulty of Paying Living Expenses: Not very hard  Food Insecurity: No Food Insecurity    Worried About Running Out of Food in the Last Year: Never true   Ran Out of Food in the Last Year: Never true  Transportation Needs: No Transportation Needs   Lack of Transportation (Medical): No   Lack of Transportation (Non-Medical): No  Physical Activity: Insufficiently Active   Days of Exercise per Week: 2 days   Minutes of Exercise per Session: 10 min  Stress: Stress Concern Present   Feeling of Stress : To some extent  Social Connections: Moderately Integrated   Frequency of Communication with Friends and Family: More than three times a week   Frequency of Social Gatherings with Friends and Family: Twice a week   Attends Religious Services: More than 4 times per year   Active Member of [employer] or Organizations: Yes   Attends Archivist Meetings: 1 to 4 times per year   Marital Status: Widowed  Human resources officer Violence: Not At Risk   Fear of Current or Ex-Partner: No   Emotionally Abused: No   Physically Abused: No   Sexually Abused: No    FAMILY HISTORY: Family history has been reviewed by me and is documented elsewhere in the electronic medical record.  CURRENT MEDICATIONS: Current medications have been reviewed by me and are documented elsewhere in electronic medical record.  ALLERGIES: Drug allergies have been reviewed by me and are documented elsewhere in the electronic medical record.  PHYSICAL EXAM:  Performance status (ECOG): 3 - Symptomatic, >50% confined to bed There were no vitals filed for this visit. Wt Readings from Last 3 Encounters:  03/02/22 153 lb (69.4 kg)  02/25/22 153 lb 11.2 oz (69.7 kg)  02/22/22 161 lb 9.6 oz (73.3 kg)   Physical Exam Vitals reviewed.  Constitutional:      Appearance: Normal appearance.     Comments: In wheelchair  Cardiovascular:     Rate and Rhythm: Normal rate and regular rhythm.     Pulses: Normal pulses.     Heart sounds: Normal heart sounds.  Pulmonary:     Effort: Pulmonary effort is normal.     Breath  sounds: Normal breath sounds.  Musculoskeletal:     Right lower leg: 1+ Edema present.     Left lower leg: 1+ Edema present.  Neurological:     General: No focal deficit present.     Mental Status: She is alert and oriented to person, place, and time.  Psychiatric:        Mood and Affect: Mood normal.        Behavior: Behavior normal.     LABORATORY DATA: Relevant labs have been reviewed by me and are documented elsewhere in the electronic medical record.  DIAGNOSTIC IMAGING: Relevant imaging has been reviewed by me and are documented elsewhere in the electronic medical record.  ASSESSMENT & PLAN: 1.  Stage IVb mantle cell lymphoma - Primary oncologist is Dr. Delton Coombes - Received first cycle of treatment with Bendamustine and rituximab on 02/18/2022 - She has had  worsening fatigue with chemotherapy, continues to have decrease in functional status   - CBC (03/04/2022: WBC 11.3/ALC 6.9/AMC 1.8, platelets 127, Hgb 7.7 - CMP (03/04/2022): Creatinine 1.16 (baseline), normal LFTs.  No major electrolyte disturbances. - PLAN:  Patient is scheduled for labs, MD visit with Dr. Delton Coombes, and next cycle of treatment on 03/18/2022   2.  TLS prophylaxis   - She is taking allopurinol daily.  She was given rasburicase with cycle 1. - Potassium supplement was stopped at her visit last week due to potassium 5.0 - Labs today (03/04/2022): Normal potassium, magnesium, uric acid, phosphorus.  LDH is 247. - PLAN: Continue allopurinol - Due to borderline high potassium, we will STOP patient's daily potassium supplement for the time being   3.  Normocytic anemia:   - CBC shows low but stable Hgb 7.7 , due to infiltration from lymphoma.   4.  Lower extremity swelling:   -Swelling is improved, now 1+ pitting edema bilaterally   - PLAN: Continue Lasix 20 mg daily   5.  Constipation - Initially reported that she has been constipated for the past week, ever since receiving chemotherapy - Prescription sent  for Colace 100 mg x 2 capsules nightly was sent to SNF - Bowel movements are well managed on Colace - PLAN: Continue Colace   6.  Nutrition & hydration   - Weight today is 151 pounds (down 2 pounds from last week) - She was evaluated by nutritionist on 03/01/2022 - She is receiving Ensure twice daily most days at SNF - Reports poor appetite - Reports that she "does not drink much water" - Blood pressure today 90/51 - PLAN: We will give IV fluids (1 L NS) today due to borderline hypotension/dehydration with BP 90/51. - Continue follow-up with dietitian due to weight loss - Encouraged small frequent meals - Encouraged nutritional supplementation with Ensure or equivalent 2x daily  - Instructed patient to drink at least 64 ounces of water daily    All questions were answered. The patient knows to call the clinic with any problems, questions or concerns.  Medical decision making: Moderate  Time spent on visit: I spent 20 minutes counseling the patient face to face. The total time spent in the appointment was 30 minutes and more than 50% was on counseling.   Harriett Rush, PA-C  03/04/2022 1:12 PM

## 2022-03-03 NOTE — Progress Notes (Signed)
[facility] History and Physical  Reason for Referral: Port-A-Cath insertion Referring Physician: Dr. Delton Coombes  Chief Complaint   New Patient (Initial Visit)     Faith Lee is a 88 y.o. female.  HPI: Patient presents for evaluation of Port-A-Cath insertion.  She was recently diagnosed with mantle cell lymphoma, and will be starting chemotherapy.  She has been progressively becoming weaker, and using a wheelchair to get around.  She is currently staying in the [facility] secondary to her increasing weakness.  Her surgical history is significant for a vaginal hysterectomy.  She denies use of blood thinning medications.  She is also denies use of tobacco products, alcohol, and illicit drugs.  Past Medical History:  Diagnosis Date   Anxiety    Hypertension    Irritable bowel disease    Osteoarthritis     Past Surgical History:  Procedure Laterality Date   ABDOMINAL HYSTERECTOMY  1974   CARDIAC CATHETERIZATION     1991 and 2005    Family History  Problem Relation Age of Onset   Renal Disease Sister    Multiple myeloma Sister     Social History   Tobacco Use   Smoking status: Never   Smokeless tobacco: Never  Vaping Use   Vaping Use: Never used  Substance Use Topics   Alcohol use: No   Drug use: No    Medications: I have reviewed the patient's current medications. Allergies as of 03/02/2022       Reactions   Lidocaine Other (See Comments), Hypertension   Patient had chest tightness with stomach pain, numbness on left side of limbs and unable to speak   Rituximab-pvvr Other (See Comments)   Patient complained of chest pressure/pain        Medication List        Accurate as of Mar 02, 2022 11:59 PM. If you have any questions, ask your nurse or doctor.          acetaminophen 500 MG tablet Commonly known as: TYLENOL Take 1,000 mg by mouth every 6 (six) hours as needed for moderate pain.   allopurinol 300 MG  tablet Commonly known as: ZYLOPRIM Take 1 tablet (300 mg total) by mouth daily.   BENDAMUSTINE HCL IV Inject into the vein every 28 (twenty-eight) days. Days 1&2 every 28 days   docusate sodium 100 MG capsule Commonly known as: Colace Take 1 capsule (100 mg total) by mouth at bedtime. May take 2 capsules if needed.   furosemide 20 MG tablet Commonly known as: LASIX Take 20 mg by mouth daily.   LORazepam 0.5 MG tablet Commonly known as: ATIVAN Take 1 tablet (0.5 mg total) by mouth 2 (two) times daily. What changed:  when to take this additional instructions   losartan 100 MG tablet Commonly known as: COZAAR Take 100 mg by mouth daily.   metoprolol tartrate 25 MG tablet Commonly known as: LOPRESSOR Take 25 mg by mouth 2 (two) times daily.   mirtazapine 7.5 MG tablet Commonly known as: REMERON Take 7.5 mg by mouth at bedtime.   prochlorperazine 10 MG tablet Commonly known as: COMPAZINE Take 1 tablet (10 mg total) by mouth every 6 (six) hours as needed for nausea or vomiting.   RITUXIMAB IV Inject into the vein every 28 (twenty-eight) days.   sulfamethoxazole-trimethoprim 800-160 MG tablet Commonly known as: BACTRIM DS Take 1 tablet by mouth 2 (two) times daily.         ROS:  Constitutional:  negative for chills, fatigue, and fevers Eyes: positive for blurred vision, negative for pain Ears, nose, mouth, throat, and face: negative for ear drainage, sore throat, and sinus problems Respiratory: positive for cough, wheezing, and shortness of breath Cardiovascular: negative for chest pain and palpitations Gastrointestinal: positive for abdominal pain and reflux symptoms, negative for nausea and vomiting Genitourinary:positive for frequency, negative for dysuria and urinary retention Integument/breast: positive for dryness, negative for rash Hematologic/lymphatic: negative for bleeding and lymphadenopathy Musculoskeletal:positive for back pain, neck pain, and joint  pain Neurological: positive for numbness, negative for dizziness and tremors Endocrine: negative for temperature intolerance  Blood pressure (!) 92/48, pulse (!) 101, temperature 98.8 F (37.1 C), temperature source Oral, resp. rate 16, height $RemoveBe'5\' 4"'CyfFzuvfz$  (1.626 m), weight 153 lb (69.4 kg), SpO2 96 %. Physical Exam Vitals reviewed.  Constitutional:      Appearance: Normal appearance.  HENT:     Head: Normocephalic and atraumatic.  Eyes:     Extraocular Movements: Extraocular movements intact.     Pupils: Pupils are equal, round, and reactive to light.  Cardiovascular:     Rate and Rhythm: Normal rate and regular rhythm.  Pulmonary:     Effort: Pulmonary effort is normal.     Breath sounds: Normal breath sounds.  Musculoskeletal:     Cervical back: Normal range of motion.  Lymphadenopathy:     Cervical: No cervical adenopathy.  Skin:    General: Skin is warm and dry.  Neurological:     General: No focal deficit present.     Mental Status: She is alert. Mental status is at baseline.  Psychiatric:        Mood and Affect: Mood normal.        Behavior: Behavior normal.    Results: No results found for this or any previous visit (from the past 48 hour(s)).  No results found.   Assessment & Plan:  Faith Lee is a 88 y.o. female who presents for evaluation of Port-A-Cath insertion.  -I explained the procedure and the reasoning for inserting a Port-A-Cath.  I further explained that I will place this catheter with the assistance of fluoroscopy and ultrasound guidance -The risk and benefits of right IJ Port-A-Cath insertion were discussed including but not limited to bleeding, infection, injury to surrounding structures, pneumothorax, and need for additional procedure.  After careful consideration, Faith Lee has decided to proceed with right IJ Port-A-Cath insertion using ultrasound guidance.  -Patient tentatively scheduled for surgery on 5/24 -Information provided to  the patient and her daughter regarding Port-A-Cath  All questions were answered to the satisfaction of the patient and family.   Graciella Freer, DO [facility] [address] [place], [place] [address] [phone] (office)

## 2022-03-04 ENCOUNTER — Other Ambulatory Visit (HOSPITAL_COMMUNITY)
Admission: RE | Admit: 2022-03-04 | Discharge: 2022-03-04 | Disposition: A | Discharge status: Home / Self Care | Payer: Medicare Other | Source: Skilled Nursing Facility | Attending: Adult Health | Admitting: Adult Health

## 2022-03-04 ENCOUNTER — Ambulatory Visit (HOSPITAL_COMMUNITY): Payer: Medicare Other | Admitting: Hematology

## 2022-03-04 ENCOUNTER — Inpatient Hospital Stay (HOSPITAL_COMMUNITY): Payer: Medicare Other

## 2022-03-04 ENCOUNTER — Inpatient Hospital Stay (HOSPITAL_BASED_OUTPATIENT_CLINIC_OR_DEPARTMENT_OTHER): Payer: Medicare Other | Admitting: Physician Assistant

## 2022-03-04 VITALS — BP 90/51 | HR 86 | Temp 98.3°F | Resp 18 | Ht 64.0 in | Wt 151.2 lb

## 2022-03-04 VITALS — BP 119/53 | HR 92 | Temp 98.6°F | Resp 18

## 2022-03-04 DIAGNOSIS — I10 Essential (primary) hypertension: Secondary | ICD-10-CM | POA: Diagnosis not present

## 2022-03-04 DIAGNOSIS — C8318 Mantle cell lymphoma, lymph nodes of multiple sites: Secondary | ICD-10-CM | POA: Diagnosis not present

## 2022-03-04 DIAGNOSIS — R5383 Other fatigue: Secondary | ICD-10-CM | POA: Diagnosis not present

## 2022-03-04 DIAGNOSIS — M1990 Unspecified osteoarthritis, unspecified site: Secondary | ICD-10-CM | POA: Diagnosis not present

## 2022-03-04 DIAGNOSIS — Z806 Family history of leukemia: Secondary | ICD-10-CM | POA: Diagnosis not present

## 2022-03-04 DIAGNOSIS — R61 Generalized hyperhidrosis: Secondary | ICD-10-CM | POA: Diagnosis not present

## 2022-03-04 DIAGNOSIS — K5900 Constipation, unspecified: Secondary | ICD-10-CM

## 2022-03-04 DIAGNOSIS — Z5111 Encounter for antineoplastic chemotherapy: Secondary | ICD-10-CM | POA: Diagnosis not present

## 2022-03-04 DIAGNOSIS — N390 Urinary tract infection, site not specified: Secondary | ICD-10-CM | POA: Insufficient documentation

## 2022-03-04 DIAGNOSIS — Z79899 Other long term (current) drug therapy: Secondary | ICD-10-CM | POA: Diagnosis not present

## 2022-03-04 DIAGNOSIS — D649 Anemia, unspecified: Secondary | ICD-10-CM | POA: Diagnosis not present

## 2022-03-04 DIAGNOSIS — C9110 Chronic lymphocytic leukemia of B-cell type not having achieved remission: Secondary | ICD-10-CM | POA: Diagnosis not present

## 2022-03-04 DIAGNOSIS — R634 Abnormal weight loss: Secondary | ICD-10-CM | POA: Diagnosis not present

## 2022-03-04 DIAGNOSIS — Z9221 Personal history of antineoplastic chemotherapy: Secondary | ICD-10-CM | POA: Diagnosis not present

## 2022-03-04 DIAGNOSIS — M7989 Other specified soft tissue disorders: Secondary | ICD-10-CM | POA: Diagnosis not present

## 2022-03-04 LAB — CBC WITH DIFFERENTIAL/PLATELET
Abs Immature Granulocytes: 0.02 10*3/uL (ref 0.00–0.07)
Basophils Absolute: 0.1 10*3/uL (ref 0.0–0.1)
Basophils Relative: 1 %
Eosinophils Absolute: 0.2 10*3/uL (ref 0.0–0.5)
Eosinophils Relative: 2 %
HCT: 24.6 % — ABNORMAL LOW (ref 36.0–46.0)
Hemoglobin: 7.7 g/dL — ABNORMAL LOW (ref 12.0–15.0)
Immature Granulocytes: 0 %
Lymphocytes Relative: 62 %
Lymphs Abs: 6.9 10*3/uL — ABNORMAL HIGH (ref 0.7–4.0)
MCH: 33.2 pg (ref 26.0–34.0)
MCHC: 31.3 g/dL (ref 30.0–36.0)
MCV: 106 fL — ABNORMAL HIGH (ref 80.0–100.0)
Monocytes Absolute: 1.8 10*3/uL — ABNORMAL HIGH (ref 0.1–1.0)
Monocytes Relative: 16 %
Neutro Abs: 2.1 10*3/uL (ref 1.7–7.7)
Neutrophils Relative %: 19 %
Platelets: 127 10*3/uL — ABNORMAL LOW (ref 150–400)
RBC: 2.32 MIL/uL — ABNORMAL LOW (ref 3.87–5.11)
RDW: 16.9 % — ABNORMAL HIGH (ref 11.5–15.5)
WBC: 11.3 10*3/uL — ABNORMAL HIGH (ref 4.0–10.5)
nRBC: 0 % (ref 0.0–0.2)

## 2022-03-04 LAB — COMPREHENSIVE METABOLIC PANEL
ALT: 14 U/L (ref 0–44)
AST: 28 U/L (ref 15–41)
Albumin: 3.5 g/dL (ref 3.5–5.0)
Alkaline Phosphatase: 44 U/L (ref 38–126)
Anion gap: 7 (ref 5–15)
BUN: 22 mg/dL (ref 8–23)
CO2: 28 mmol/L (ref 22–32)
Calcium: 8.3 mg/dL — ABNORMAL LOW (ref 8.9–10.3)
Chloride: 103 mmol/L (ref 98–111)
Creatinine, Ser: 1.16 mg/dL — ABNORMAL HIGH (ref 0.44–1.00)
GFR, Estimated: 45 mL/min — ABNORMAL LOW (ref >60)
Glucose, Bld: 115 mg/dL — ABNORMAL HIGH (ref 70–99)
Potassium: 4.2 mmol/L (ref 3.5–5.1)
Sodium: 138 mmol/L (ref 135–145)
Total Bilirubin: 0.4 mg/dL (ref 0.3–1.2)
Total Protein: 6 g/dL — ABNORMAL LOW (ref 6.5–8.1)

## 2022-03-04 LAB — PHOSPHORUS: Phosphorus: 2.9 mg/dL (ref 2.5–4.6)

## 2022-03-04 LAB — MAGNESIUM: Magnesium: 2.3 mg/dL (ref 1.7–2.4)

## 2022-03-04 LAB — LACTATE DEHYDROGENASE: LDH: 247 U/L — ABNORMAL HIGH (ref 98–192)

## 2022-03-04 LAB — SAMPLE TO BLOOD BANK

## 2022-03-04 LAB — URIC ACID: Uric Acid, Serum: 3.9 mg/dL (ref 2.5–7.1)

## 2022-03-04 MED ORDER — SODIUM CHLORIDE 0.9 % IV SOLN: INTRAVENOUS | Status: DC

## 2022-03-04 NOTE — H&P (Signed)
[facility] History and Physical  Reason for Referral: Port-A-Cath insertion Referring Physician: Dr. Delton Coombes  Chief Complaint   New Patient (Initial Visit)     Faith Lee is a 88 y.o. female.  HPI: Patient presents for evaluation of Port-A-Cath insertion.  She was recently diagnosed with mantle cell lymphoma, and will be starting chemotherapy.  She has been progressively becoming weaker, and using a wheelchair to get around.  She is currently staying in the [facility] secondary to her increasing weakness.  Her surgical history is significant for a vaginal hysterectomy.  She denies use of blood thinning medications.  She is also denies use of tobacco products, alcohol, and illicit drugs.  Past Medical History:  Diagnosis Date   Anxiety    Hypertension    Irritable bowel disease    Osteoarthritis     Past Surgical History:  Procedure Laterality Date   ABDOMINAL HYSTERECTOMY  1974   CARDIAC CATHETERIZATION     1991 and 2005    Family History  Problem Relation Age of Onset   Renal Disease Sister    Multiple myeloma Sister     Social History   Tobacco Use   Smoking status: Never   Smokeless tobacco: Never  Vaping Use   Vaping Use: Never used  Substance Use Topics   Alcohol use: No   Drug use: No    Medications: I have reviewed the patient's current medications. Allergies as of 03/02/2022       Reactions   Lidocaine Other (See Comments), Hypertension   Patient had chest tightness with stomach pain, numbness on left side of limbs and unable to speak   Rituximab-pvvr Other (See Comments)   Patient complained of chest pressure/pain        Medication List        Accurate as of Mar 02, 2022 11:59 PM. If you have any questions, ask your nurse or doctor.          acetaminophen 500 MG tablet Commonly known as: TYLENOL Take 1,000 mg by mouth every 6 (six) hours as needed for moderate pain.   allopurinol 300 MG  tablet Commonly known as: ZYLOPRIM Take 1 tablet (300 mg total) by mouth daily.   BENDAMUSTINE HCL IV Inject into the vein every 28 (twenty-eight) days. Days 1&2 every 28 days   docusate sodium 100 MG capsule Commonly known as: Colace Take 1 capsule (100 mg total) by mouth at bedtime. May take 2 capsules if needed.   furosemide 20 MG tablet Commonly known as: LASIX Take 20 mg by mouth daily.   LORazepam 0.5 MG tablet Commonly known as: ATIVAN Take 1 tablet (0.5 mg total) by mouth 2 (two) times daily. What changed:  when to take this additional instructions   losartan 100 MG tablet Commonly known as: COZAAR Take 100 mg by mouth daily.   metoprolol tartrate 25 MG tablet Commonly known as: LOPRESSOR Take 25 mg by mouth 2 (two) times daily.   mirtazapine 7.5 MG tablet Commonly known as: REMERON Take 7.5 mg by mouth at bedtime.   prochlorperazine 10 MG tablet Commonly known as: COMPAZINE Take 1 tablet (10 mg total) by mouth every 6 (six) hours as needed for nausea or vomiting.   RITUXIMAB IV Inject into the vein every 28 (twenty-eight) days.   sulfamethoxazole-trimethoprim 800-160 MG tablet Commonly known as: BACTRIM DS Take 1 tablet by mouth 2 (two) times daily.         ROS:  Constitutional:  negative for chills, fatigue, and fevers Eyes: positive for blurred vision, negative for pain Ears, nose, mouth, throat, and face: negative for ear drainage, sore throat, and sinus problems Respiratory: positive for cough, wheezing, and shortness of breath Cardiovascular: negative for chest pain and palpitations Gastrointestinal: positive for abdominal pain and reflux symptoms, negative for nausea and vomiting Genitourinary:positive for frequency, negative for dysuria and urinary retention Integument/breast: positive for dryness, negative for rash Hematologic/lymphatic: negative for bleeding and lymphadenopathy Musculoskeletal:positive for back pain, neck pain, and joint  pain Neurological: positive for numbness, negative for dizziness and tremors Endocrine: negative for temperature intolerance  Blood pressure (!) 92/48, pulse (!) 101, temperature 98.8 F (37.1 C), temperature source Oral, resp. rate 16, height $RemoveBe'5\' 4"'XhqMyuBgf$  (1.626 m), weight 153 lb (69.4 kg), SpO2 96 %. Physical Exam Vitals reviewed.  Constitutional:      Appearance: Normal appearance.  HENT:     Head: Normocephalic and atraumatic.  Eyes:     Extraocular Movements: Extraocular movements intact.     Pupils: Pupils are equal, round, and reactive to light.  Cardiovascular:     Rate and Rhythm: Normal rate and regular rhythm.  Pulmonary:     Effort: Pulmonary effort is normal.     Breath sounds: Normal breath sounds.  Musculoskeletal:     Cervical back: Normal range of motion.  Lymphadenopathy:     Cervical: No cervical adenopathy.  Skin:    General: Skin is warm and dry.  Neurological:     General: No focal deficit present.     Mental Status: She is alert. Mental status is at baseline.  Psychiatric:        Mood and Affect: Mood normal.        Behavior: Behavior normal.    Results: No results found for this or any previous visit (from the past 48 hour(s)).  No results found.   Assessment & Plan:  Faith Lee is a 88 y.o. female who presents for evaluation of Port-A-Cath insertion.  -I explained the procedure and the reasoning for inserting a Port-A-Cath.  I further explained that I will place this catheter with the assistance of fluoroscopy and ultrasound guidance -The risk and benefits of right IJ Port-A-Cath insertion were discussed including but not limited to bleeding, infection, injury to surrounding structures, pneumothorax, and need for additional procedure.  After careful consideration, Faith Lee has decided to proceed with right IJ Port-A-Cath insertion using ultrasound guidance.  -Patient tentatively scheduled for surgery on 5/24 -Information provided to  the patient and her daughter regarding Port-A-Cath  All questions were answered to the satisfaction of the patient and family.   Graciella Freer, DO [facility] [address] [place], [place] [address] [phone] (office)

## 2022-03-04 NOTE — Patient Instructions (Signed)
[place] [facility] Discharge Instructions  You were seen today by Tarri Abernethy PA-C for your chemotherapy follow-up visit.  CONSTIPATION: Take Colace 100 mg capsule every night.  You can increase to 2 capsules each night if you are still having significant constipation.  You should stop this medication if you start having any diarrhea.  WEIGHT LOSS: Drink Ensure 3 times each day.  Continue to follow up with the cancer center dietitian.  You should drink at least 1.5 L of water daily.  MEDICATIONS: -Continue allopurinol - STOP potassium supplement  FOLLOW-UP APPOINTMENT: Next visit on 03/18/2022 to see Dr. Delton Coombes and start next cycle of chemotherapy   Thank you for choosing [place] [facility] to provide your oncology and hematology care.  To afford each patient quality time with our provider, please arrive at least 15 minutes before your scheduled appointment time.   If you have a lab appointment with the [facility] please come in thru the Main Entrance and check in at the main information desk.  You need to re-schedule your appointment should you arrive 10 or more minutes late.  We strive to give you quality time with our providers, and arriving late affects you and other patients whose appointments are after yours.  Also, if you no show three or more times for appointments you may be dismissed from the clinic at the providers discretion.     Again, thank you for choosing [facility].  Our hope is that these requests will decrease the amount of time that you wait before being seen by our physicians.       _____________________________________________________________  Should you have questions after your visit to [facility], please contact our office at [phone] and follow the prompts.  Our office hours are 8:00 a.m. and 4:30 p.m. Monday - Friday.  Please note that voicemails left after 4:00  p.m. may not be returned until the following business day.  We are closed weekends and major holidays.  You do have access to a nurse 24-7, just call the main number to the clinic [phone] and do not press any options, hold on the line and a nurse will answer the phone.    For prescription refill requests, have your pharmacy contact our office and allow 72 hours.    Due to Covid, you will need to wear a mask upon entering the hospital. If you do not have a mask, a mask will be given to you at the Main Entrance upon arrival. For doctor visits, patients may have 1 support person age 72 or older with them. For treatment visits, patients can not have anyone with them due to social distancing guidelines and our immunocompromised population.

## 2022-03-04 NOTE — Patient Instructions (Signed)
[facility]  Discharge Instructions: Thank you for choosing [facility] to provide your oncology and hematology care.  If you have a lab appointment with the [facility], please come in thru the Main Entrance and check in at the main information desk.  Wear comfortable clothing and clothing appropriate for easy access to any Portacath or PICC line.   We strive to give you quality time with your provider. You may need to reschedule your appointment if you arrive late (15 or more minutes).  Arriving late affects you and other patients whose appointments are after yours.  Also, if you miss three or more appointments without notifying the office, you may be dismissed from the clinic at the provider's discretion.      For prescription refill requests, have your pharmacy contact our office and allow 72 hours for refills to be completed.    Today you received the following hydration fluids, return as scheduled.   To help prevent nausea and vomiting after your treatment, we encourage you to take your nausea medication as directed.  BELOW ARE SYMPTOMS THAT SHOULD BE REPORTED IMMEDIATELY: *FEVER GREATER THAN 100.4 F (38 C) OR HIGHER *CHILLS OR SWEATING *NAUSEA AND VOMITING THAT IS NOT CONTROLLED WITH YOUR NAUSEA MEDICATION *UNUSUAL SHORTNESS OF BREATH *UNUSUAL BRUISING OR BLEEDING *URINARY PROBLEMS (pain or burning when urinating, or frequent urination) *BOWEL PROBLEMS (unusual diarrhea, constipation, pain near the anus) TENDERNESS IN MOUTH AND THROAT WITH OR WITHOUT PRESENCE OF ULCERS (sore throat, sores in mouth, or a toothache) UNUSUAL RASH, SWELLING OR PAIN  UNUSUAL VAGINAL DISCHARGE OR ITCHING   Items with * indicate a potential emergency and should be followed up as soon as possible or go to the Emergency Department if any problems should occur.  Please show the CHEMOTHERAPY ALERT CARD or IMMUNOTHERAPY ALERT CARD at check-in to the Emergency Department and triage  nurse.  Should you have questions after your visit or need to cancel or reschedule your appointment, please contact [facility] [phone]  and follow the prompts.  Office hours are 8:00 a.m. to 4:30 p.m. Monday - Friday. Please note that voicemails left after 4:00 p.m. may not be returned until the following business day.  We are closed weekends and major holidays. You have access to a nurse at all times for urgent questions. Please call the main number to the clinic [phone] and follow the prompts.  For any non-urgent questions, you may also contact your provider using MyChart. We now offer e-Visits for anyone 100 and older to request care online for non-urgent symptoms. For details visit [email or web address].   Also download the MyChart app! Go to the app store, search "MyChart", open the app, select [facility], and log in with your MyChart username and password.  Due to Covid, a mask is required upon entering the hospital/clinic. If you do not have a mask, one will be given to you upon arrival. For doctor visits, patients may have 1 support person aged 69 or older with them. For treatment visits, patients cannot have anyone with them due to current Covid guidelines and our immunocompromised population.

## 2022-03-04 NOTE — Patient Instructions (Signed)
Faith Lee  03/04/2022     '@PREFPERIOPPHARMACY'$ @   Your procedure is scheduled on  03/10/2022.   Report to [name] at  0700  A.M.   Call this number if you have problems the morning of surgery:  [phone]   Remember:  Do not eat or drink after midnight.      Take these medicines the morning of surgery with A SIP OF WATER                                 allopurinol, metoprolol.     Do not wear jewelry, make-up or nail polish.  Do not wear lotions, powders, or perfumes, or deodorant.  Do not shave 48 hours prior to surgery.  Men may shave face and neck.  Do not bring valuables to the hospital.  [facility] is not responsible for any belongings or valuables.  Contacts, dentures or bridgework may not be worn into surgery.  Leave your suitcase in the car.  After surgery it may be brought to your room.  For patients admitted to the hospital, discharge time will be determined by your treatment team.  Patients discharged the day of surgery will not be allowed to drive home and must have someone with them for 24 hours.    Special instructions:   DO NOT smoke tobacco or vape for 24 hours before your procedure.  Please read over the following fact sheets that you were given. Anesthesia Post-op Instructions and Care and Recovery After Surgery      Implanted Port Insertion, Care After The following information offers guidance on how to care for yourself after your procedure. Your health care provider may also give you more specific instructions. If you have problems or questions, contact your health care provider. What can I expect after the procedure? After the procedure, it is common to have: Discomfort at the port insertion site. Bruising on the skin over the port. This should improve over 3-4 days. Follow these instructions at home: [facility] care After your port is placed, you will get a manufacturer's information card. The card has information about  your port. Keep this card with you at all times. Take care of the port as told by your health care provider. Ask your health care provider if you or a family member can get training for taking care of the port at home. A home health care nurse will be be available to help care for the port. Make sure to remember what type of port you have. Incision care     Follow instructions from your health care provider about how to take care of your port insertion site. Make sure you: Wash your hands with soap and water for at least 20 seconds before and after you change your bandage (dressing). If soap and water are not available, use hand sanitizer. Change your dressing as told by your health care provider. Leave stitches (sutures), skin glue, or adhesive strips in place. These skin closures may need to stay in place for 2 weeks or longer. If adhesive strip edges start to loosen and curl up, you may trim the loose edges. Do not remove adhesive strips completely unless your health care provider tells you to do that. Check your port insertion site every day for signs of infection. Check for: Redness, swelling, or pain. Fluid or blood. Warmth. Pus or a bad  smell. Activity Return to your normal activities as told by your health care provider. Ask your health care provider what activities are safe for you. You may have to avoid lifting. Ask your health care provider how much you can safely lift. General instructions Take over-the-counter and prescription medicines only as told by your health care provider. Do not take baths, swim, or use a hot tub until your health care provider approves. Ask your health care provider if you may take showers. You may only be allowed to take sponge baths. If you were given a sedative during the procedure, it can affect you for several hours. Do not drive or operate machinery until your health care provider says that it is safe. Wear a medical alert bracelet in case of an  emergency. This will tell any health care providers that you have a port. Keep all follow-up visits. This is important. Contact a health care provider if: You cannot flush your port with saline as directed, or you cannot draw blood from the port. You have a fever or chills. You have redness, swelling, or pain around your port insertion site. You have fluid or blood coming from your port insertion site. Your port insertion site feels warm to the touch. You have pus or a bad smell coming from the port insertion site. Get help right away if: You have chest pain or shortness of breath. You have bleeding from your port that you cannot control. These symptoms may be an emergency. Get help right away. Call 911. Do not wait to see if the symptoms will go away. Do not drive yourself to the hospital. Summary Take care of the port as told by your health care provider. Keep the manufacturer's information card with you at all times. Change your dressing as told by your health care provider. Contact a health care provider if you have a fever or chills or if you have redness, swelling, or pain around your port insertion site. Keep all follow-up visits. This information is not intended to replace advice given to you by your health care provider. Make sure you discuss any questions you have with your health care provider. Document Revised: 04/07/2021 Document Reviewed: 04/07/2021 Elsevier Patient Education  [place] After This sheet gives you information about how to care for yourself after your procedure. Your health care provider may also give you more specific instructions. If you have problems or questions, contact your health care provider. What can I expect after the procedure? After the procedure, it is common to have: Tiredness. Forgetfulness about what happened after the procedure. Impaired judgment for important decisions. Nausea or vomiting. Some  difficulty with balance. Follow these instructions at home: For the time period you were told by your health care provider:     Rest as needed. Do not participate in activities where you could fall or become injured. Do not drive or use machinery. Do not drink alcohol. Do not take sleeping pills or medicines that cause drowsiness. Do not make important decisions or sign legal documents. Do not take care of children on your own. Eating and drinking Follow the diet that is recommended by your health care provider. Drink enough fluid to keep your urine pale yellow. If you vomit: Drink water, juice, or soup when you can drink without vomiting. Make sure you have little or no nausea before eating solid foods. General instructions Have a responsible adult stay with you for the time you are told. It  is important to have someone help care for you until you are awake and alert. Take over-the-counter and prescription medicines only as told by your health care provider. If you have sleep apnea, surgery and certain medicines can increase your risk for breathing problems. Follow instructions from your health care provider about wearing your sleep device: Anytime you are sleeping, including during daytime naps. While taking prescription pain medicines, sleeping medicines, or medicines that make you drowsy. Avoid smoking. Keep all follow-up visits as told by your health care provider. This is important. Contact a health care provider if: You keep feeling nauseous or you keep vomiting. You feel light-headed. You are still sleepy or having trouble with balance after 24 hours. You develop a rash. You have a fever. You have redness or swelling around the IV site. Get help right away if: You have trouble breathing. You have new-onset confusion at home. Summary For several hours after your procedure, you may feel tired. You may also be forgetful and have poor judgment. Have a responsible adult stay  with you for the time you are told. It is important to have someone help care for you until you are awake and alert. Rest as told. Do not drive or operate machinery. Do not drink alcohol or take sleeping pills. Get help right away if you have trouble breathing, or if you suddenly become confused. This information is not intended to replace advice given to you by your health care provider. Make sure you discuss any questions you have with your health care provider. Document Revised: 09/08/2021 Document Reviewed: 09/06/2019 Elsevier Patient Education  [place]. How to Use Chlorhexidine for Bathing Chlorhexidine gluconate (CHG) is a germ-killing (antiseptic) solution that is used to clean the skin. It can get rid of the bacteria that normally live on the skin and can keep them away for about 24 hours. To clean your skin with CHG, you may be given: A CHG solution to use in the shower or as part of a sponge bath. A prepackaged cloth that contains CHG. Cleaning your skin with CHG may help lower the risk for infection: While you are staying in the intensive care unit of the hospital. If you have a vascular access, such as a central line, to provide short-term or long-term access to your veins. If you have a catheter to drain urine from your bladder. If you are on a ventilator. A ventilator is a machine that helps you breathe by moving air in and out of your lungs. After surgery. What are the risks? Risks of using CHG include: A skin reaction. Hearing loss, if CHG gets in your ears and you have a perforated eardrum. Eye injury, if CHG gets in your eyes and is not rinsed out. The CHG product catching fire. Make sure that you avoid smoking and flames after applying CHG to your skin. Do not use CHG: If you have a chlorhexidine allergy or have previously reacted to chlorhexidine. On babies younger than 68 months of age. How to use CHG solution Use CHG only as told by your health care  provider, and follow the instructions on the label. Use the full amount of CHG as directed. Usually, this is one bottle. During a shower Follow these steps when using CHG solution during a shower (unless your health care provider gives you different instructions): Start the shower. Use your normal soap and shampoo to wash your face and hair. Turn off the shower or move out of the shower stream. Pour  the CHG onto a clean washcloth. Do not use any type of brush or rough-edged sponge. Starting at your neck, lather your body down to your toes. Make sure you follow these instructions: If you will be having surgery, pay special attention to the part of your body where you will be having surgery. Scrub this area for at least 1 minute. Do not use CHG on your head or face. If the solution gets into your ears or eyes, rinse them well with water. Avoid your genital area. Avoid any areas of skin that have broken skin, cuts, or scrapes. Scrub your back and under your arms. Make sure to wash skin folds. Let the lather sit on your skin for 1-2 minutes or as long as told by your health care provider. Thoroughly rinse your entire body in the shower. Make sure that all body creases and crevices are rinsed well. Dry off with a clean towel. Do not put any substances on your body afterward--such as powder, lotion, or perfume--unless you are told to do so by your health care provider. Only use lotions that are recommended by the manufacturer. Put on clean clothes or pajamas. If it is the night before your surgery, sleep in clean sheets.  During a sponge bath Follow these steps when using CHG solution during a sponge bath (unless your health care provider gives you different instructions): Use your normal soap and shampoo to wash your face and hair. Pour the CHG onto a clean washcloth. Starting at your neck, lather your body down to your toes. Make sure you follow these instructions: If you will be having surgery,  pay special attention to the part of your body where you will be having surgery. Scrub this area for at least 1 minute. Do not use CHG on your head or face. If the solution gets into your ears or eyes, rinse them well with water. Avoid your genital area. Avoid any areas of skin that have broken skin, cuts, or scrapes. Scrub your back and under your arms. Make sure to wash skin folds. Let the lather sit on your skin for 1-2 minutes or as long as told by your health care provider. Using a different clean, wet washcloth, thoroughly rinse your entire body. Make sure that all body creases and crevices are rinsed well. Dry off with a clean towel. Do not put any substances on your body afterward--such as powder, lotion, or perfume--unless you are told to do so by your health care provider. Only use lotions that are recommended by the manufacturer. Put on clean clothes or pajamas. If it is the night before your surgery, sleep in clean sheets. How to use CHG prepackaged cloths Only use CHG cloths as told by your health care provider, and follow the instructions on the label. Use the CHG cloth on clean, dry skin. Do not use the CHG cloth on your head or face unless your health care provider tells you to. When washing with the CHG cloth: Avoid your genital area. Avoid any areas of skin that have broken skin, cuts, or scrapes. Before surgery Follow these steps when using a CHG cloth to clean before surgery (unless your health care provider gives you different instructions): Using the CHG cloth, vigorously scrub the part of your body where you will be having surgery. Scrub using a back-and-forth motion for 3 minutes. The area on your body should be completely wet with CHG when you are done scrubbing. Do not rinse. Discard the cloth and let the  area air-dry. Do not put any substances on the area afterward, such as powder, lotion, or perfume. Put on clean clothes or pajamas. If it is the night before your  surgery, sleep in clean sheets.  For general bathing Follow these steps when using CHG cloths for general bathing (unless your health care provider gives you different instructions). Use a separate CHG cloth for each area of your body. Make sure you wash between any folds of skin and between your fingers and toes. Wash your body in the following order, switching to a new cloth after each step: The front of your neck, shoulders, and chest. Both of your arms, under your arms, and your hands. Your stomach and groin area, avoiding the genitals. Your right leg and foot. Your left leg and foot. The back of your neck, your back, and your buttocks. Do not rinse. Discard the cloth and let the area air-dry. Do not put any substances on your body afterward--such as powder, lotion, or perfume--unless you are told to do so by your health care provider. Only use lotions that are recommended by the manufacturer. Put on clean clothes or pajamas. Contact a health care provider if: Your skin gets irritated after scrubbing. You have questions about using your solution or cloth. You swallow any chlorhexidine. Call your local poison control center (1-6622251201 in the [place] Get help right away if: Your eyes itch badly, or they become very red or swollen. Your skin itches badly and is red or swollen. Your hearing changes. You have trouble seeing. You have swelling or tingling in your mouth or throat. You have trouble breathing. These symptoms may represent a serious problem that is an emergency. Do not wait to see if the symptoms will go away. Get medical help right away. Call your local emergency services (911 in the [place] Do not drive yourself to the hospital. Summary Chlorhexidine gluconate (CHG) is a germ-killing (antiseptic) solution that is used to clean the skin. Cleaning your skin with CHG may help to lower your risk for infection. You may be given CHG to use for bathing. It may be in a bottle or in  a prepackaged cloth to use on your skin. Carefully follow your health care provider's instructions and the instructions on the product label. Do not use CHG if you have a chlorhexidine allergy. Contact your health care provider if your skin gets irritated after scrubbing. This information is not intended to replace advice given to you by your health care provider. Make sure you discuss any questions you have with your health care provider. Document Revised: 12/15/2020 Document Reviewed: 12/15/2020 Elsevier Patient Education  [place].

## 2022-03-04 NOTE — Progress Notes (Signed)
Patient tolerated hydration infusion with no complaints voiced. Peripheral IV site clean and dry with good blood return noted before and after infusion. Band aid applied. VSS with discharge and left in satisfactory condition with no s/s of distress noted.

## 2022-03-05 ENCOUNTER — Other Ambulatory Visit: Payer: Self-pay | Admitting: Adult Health

## 2022-03-05 ENCOUNTER — Encounter: Payer: Self-pay | Admitting: Adult Health

## 2022-03-05 ENCOUNTER — Non-Acute Institutional Stay (SKILLED_NURSING_FACILITY): Payer: Medicare Other | Admitting: Adult Health

## 2022-03-05 DIAGNOSIS — C8318 Mantle cell lymphoma, lymph nodes of multiple sites: Secondary | ICD-10-CM | POA: Diagnosis not present

## 2022-03-05 DIAGNOSIS — F339 Major depressive disorder, recurrent, unspecified: Secondary | ICD-10-CM

## 2022-03-05 DIAGNOSIS — N1832 Chronic kidney disease, stage 3b: Secondary | ICD-10-CM

## 2022-03-05 DIAGNOSIS — I700 Atherosclerosis of aorta: Secondary | ICD-10-CM | POA: Diagnosis not present

## 2022-03-05 MED ORDER — LORAZEPAM 0.5 MG PO TABS: 0.5000 mg | ORAL_TABLET | Freq: Two times a day (BID) | ORAL | 0 refills | Status: DC

## 2022-03-05 NOTE — Progress Notes (Signed)
Location:  [facility] Room Number: 141-W Place of Service:  SNF ([id number])   CODE STATUS: Full Code  Allergies  Allergen Reactions   Lidocaine Other (See Comments) and Hypertension    Patient had chest tightness with stomach pain, numbness on left side of limbs and unable to speak   Rituximab-Pvvr Other (See Comments)    Patient complained of chest pressure/pain     Chief Complaint  Patient presents with   Acute Visit    Care plan meeting    HPI:  We have come together for her care plan meeting. Family present BIMS 14/15 mood 10/30: depression. Is out of bed daily; no falls. She requires limited to extensive assist with her adls. She is frequently incontinent of bladder and bowel. Dietary: appetite is fair is on supplements; weight is without significant change; feeds self. Therapy: none at this time.  She does spend most of her time in her room. 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  Major depression recurrent chronic  Past Medical History:  Diagnosis Date   Anxiety    Hypertension    Irritable bowel disease    Osteoarthritis     Past Surgical History:  Procedure Laterality Date   ABDOMINAL HYSTERECTOMY  [place] and 2005    Social History   Socioeconomic History   Marital status: Widowed    Spouse name: Not on file   Number of children: Not on file   Years of education: Not on file   Highest education level: Not on file  Occupational History   Not on file  Tobacco Use   Smoking status: Never   Smokeless tobacco: Never  Vaping Use   Vaping Use: Never used  Substance and Sexual Activity   Alcohol use: No   Drug use: No   Sexual activity: Not on file  Other Topics Concern   Not on file  Social History Narrative   Not on file   Social Determinants of Health    Financial Resource Strain: Low Risk    Difficulty of Paying Living Expenses: Not very hard  Food Insecurity: No Food Insecurity   Worried About Running Out of Food in the Last Year: Never true   Ran Out of Food in the Last Year: Never true  Transportation Needs: No Transportation Needs   Lack of Transportation (Medical): No   Lack of Transportation (Non-Medical): No  Physical Activity: Insufficiently Active   Days of Exercise per Week: 2 days   Minutes of Exercise per Session: 10 min  Stress: Stress Concern Present   Feeling of Stress : To some extent  Social Connections: Moderately Integrated   Frequency of Communication with Friends and Family: More than three times a week   Frequency of Social Gatherings with Friends and Family: Twice a week   Attends Religious Services: More than 4 times per year   Active Member of [employer] or Organizations: Yes   Attends Archivist Meetings: 1 to 4 times per year   Marital Status: Widowed  Human resources officer Violence: Not At Risk   Fear of Current or Ex-Partner: No   Emotionally Abused: No   Physically Abused: No   Sexually Abused: No   Family History  Problem Relation Age of Onset   Renal Disease Sister  Multiple myeloma Sister       VITAL SIGNS BP (!) 126/56   Pulse 73   Temp (!) 97.5 F (36.4 C)   Resp 20   Ht _0  (1.626 m)   Wt 151 lb 6.4 oz (68.7 kg)   SpO2 99%   BMI 25.99 kg/m   Outpatient Encounter Medications as of 03/05/2022  Medication Sig   acetaminophen (TYLENOL) 500 MG tablet Take 1,000 mg by mouth every 6 (six) hours as needed for moderate pain.   docusate sodium (COLACE) 100 MG capsule Take 1 capsule (100 mg total) by mouth at bedtime. May take 2 capsules if needed.   Ensure Max Protein (ENSURE MAX PROTEIN) LIQD due to increased protein/energy needs related to cancer treatments Three Times A Day   furosemide (LASIX) 20 MG tablet Take 20 mg by mouth daily.   LORazepam (ATIVAN) 0.5 MG tablet Take 1  tablet (0.5 mg total) by mouth 2 (two) times daily. (Patient taking differently: Take 0.5 mg by mouth 4 (four) times daily -  before meals and at bedtime. Before Meals and At Bedtime)   losartan (COZAAR) 100 MG tablet Take 100 mg by mouth daily.   metoprolol tartrate (LOPRESSOR) 25 MG tablet Take 25 mg by mouth 2 (two) times daily.   mirtazapine (REMERON) 7.5 MG tablet Take 7.5 mg by mouth at bedtime.   allopurinol (ZYLOPRIM) 300 MG tablet Take 1 tablet (300 mg total) by mouth daily. (Patient not taking: Reported on 03/04/2022)   [DISCONTINUED] BENDAMUSTINE HCL IV Inject into the vein every 28 (twenty-eight) days. Days 1&2 every 28 days   [DISCONTINUED] prochlorperazine (COMPAZINE) 10 MG tablet Take 1 tablet (10 mg total) by mouth every 6 (six) hours as needed for nausea or vomiting. (Patient not taking: Reported on 03/04/2022)   [DISCONTINUED] RITUXIMAB IV Inject into the vein every 28 (twenty-eight) days.   [DISCONTINUED] sulfamethoxazole-trimethoprim (BACTRIM DS) 800-160 MG tablet Take 1 tablet by mouth 2 (two) times daily. (Patient not taking: Reported on 03/04/2022)   No facility-administered encounter medications on file as of 03/05/2022.     SIGNIFICANT DIAGNOSTIC EXAMS   LAB REVIEWED:   02-18-22: wbc 61.7; hgb 7.7; hct 23.5; mcv 102.6 plt 157; glucose 110; bun 20; creat 1.35; k+ 4.3; na++ 132; ca 8.3; GFR 3.8; mag 2.0 phos 3.6; protein 6.0; albumin 3.5 uric acid 5.5   NO NEW LABS.   Review of Systems  Constitutional:  Negative for malaise/fatigue.  Respiratory:  Negative for cough and shortness of breath.   Cardiovascular:  Negative for chest pain, palpitations and leg swelling.  Gastrointestinal:  Positive for constipation. Negative for abdominal pain and heartburn.  Musculoskeletal:  Negative for back pain, joint pain and myalgias.  Skin: Negative.   Neurological:  Negative for dizziness.  Psychiatric/Behavioral:  The patient is not nervous/anxious.    Physical  Exam Constitutional:      General: She is not in acute distress.    Appearance: She is well-developed. She is not diaphoretic.  Neck:     Thyroid: No thyromegaly.  Cardiovascular:     Rate and Rhythm: Normal rate and regular rhythm.     Pulses: Normal pulses.     Heart sounds: Normal heart sounds.  Pulmonary:     Effort: Pulmonary effort is normal. No respiratory distress.     Breath sounds: Normal breath sounds.  Abdominal:     General: Bowel sounds are normal. There is no distension.     Palpations: Abdomen is soft.  Tenderness: There is no abdominal tenderness.  Musculoskeletal:        General: Normal range of motion.     Cervical back: Neck supple.     Right lower leg: Edema present.     Left lower leg: Edema present.     Comments: 2-3 + bilateral lower extremity edema    Lymphadenopathy:     Cervical: No cervical adenopathy.  Skin:    General: Skin is warm and dry.  Neurological:     Mental Status: She is alert and oriented to person, place, and time.  Psychiatric:        Mood and Affect: Mood normal.     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   Time spent with patient 40 minutes: plan of care medications; dietary      Ok Edwards NP [facility]  call [phone]

## 2022-03-07 LAB — URINE CULTURE: Culture: >=100000 — AB

## 2022-03-08 ENCOUNTER — Encounter (HOSPITAL_COMMUNITY)
Admission: RE | Admit: 2022-03-08 | Discharge: 2022-03-08 | Disposition: A | Discharge status: Home / Self Care | Payer: Medicare Other | Source: Ambulatory Visit | Attending: Surgery | Admitting: Surgery

## 2022-03-10 ENCOUNTER — Ambulatory Visit (HOSPITAL_COMMUNITY)
Admission: RE | Admit: 2022-03-10 | Discharge: 2022-03-10 | Disposition: A | Discharge status: Skilled Nursing Facility | Payer: Medicare Other | Attending: Surgery | Admitting: Surgery

## 2022-03-10 ENCOUNTER — Other Ambulatory Visit (HOSPITAL_COMMUNITY): Payer: Self-pay

## 2022-03-10 ENCOUNTER — Other Ambulatory Visit: Payer: Self-pay

## 2022-03-10 ENCOUNTER — Ambulatory Visit (HOSPITAL_COMMUNITY): Payer: Medicare Other

## 2022-03-10 ENCOUNTER — Ambulatory Visit (HOSPITAL_COMMUNITY): Payer: Medicare Other | Admitting: Anesthesiology

## 2022-03-10 ENCOUNTER — Telehealth (HOSPITAL_COMMUNITY): Payer: Self-pay | Admitting: *Deleted

## 2022-03-10 ENCOUNTER — Encounter (HOSPITAL_COMMUNITY)
Admission: RE | Disposition: A | Discharge status: Skilled Nursing Facility | Payer: Self-pay | Source: Home / Self Care | Attending: Surgery

## 2022-03-10 ENCOUNTER — Encounter (HOSPITAL_COMMUNITY): Payer: Self-pay | Admitting: Surgery

## 2022-03-10 ENCOUNTER — Ambulatory Visit (HOSPITAL_BASED_OUTPATIENT_CLINIC_OR_DEPARTMENT_OTHER): Payer: Medicare Other | Admitting: Anesthesiology

## 2022-03-10 DIAGNOSIS — M1990 Unspecified osteoarthritis, unspecified site: Secondary | ICD-10-CM | POA: Diagnosis not present

## 2022-03-10 DIAGNOSIS — Z452 Encounter for adjustment and management of vascular access device: Secondary | ICD-10-CM | POA: Diagnosis not present

## 2022-03-10 DIAGNOSIS — D649 Anemia, unspecified: Secondary | ICD-10-CM

## 2022-03-10 DIAGNOSIS — M1A30X0 Chronic gout due to renal impairment, unspecified site, without tophus (tophi): Secondary | ICD-10-CM

## 2022-03-10 DIAGNOSIS — C8310 Mantle cell lymphoma, unspecified site: Secondary | ICD-10-CM | POA: Diagnosis not present

## 2022-03-10 DIAGNOSIS — N183 Chronic kidney disease, stage 3 unspecified: Secondary | ICD-10-CM

## 2022-03-10 DIAGNOSIS — N1832 Chronic kidney disease, stage 3b: Secondary | ICD-10-CM

## 2022-03-10 DIAGNOSIS — F418 Other specified anxiety disorders: Secondary | ICD-10-CM

## 2022-03-10 DIAGNOSIS — D500 Iron deficiency anemia secondary to blood loss (chronic): Secondary | ICD-10-CM

## 2022-03-10 DIAGNOSIS — C8318 Mantle cell lymphoma, lymph nodes of multiple sites: Secondary | ICD-10-CM | POA: Diagnosis not present

## 2022-03-10 DIAGNOSIS — D72820 Lymphocytosis (symptomatic): Secondary | ICD-10-CM

## 2022-03-10 DIAGNOSIS — I517 Cardiomegaly: Secondary | ICD-10-CM | POA: Diagnosis not present

## 2022-03-10 DIAGNOSIS — I10 Essential (primary) hypertension: Secondary | ICD-10-CM

## 2022-03-10 DIAGNOSIS — R600 Localized edema: Secondary | ICD-10-CM

## 2022-03-10 DIAGNOSIS — I700 Atherosclerosis of aorta: Secondary | ICD-10-CM

## 2022-03-10 DIAGNOSIS — F339 Major depressive disorder, recurrent, unspecified: Secondary | ICD-10-CM

## 2022-03-10 HISTORY — PX: PORTACATH PLACEMENT: SHX2246

## 2022-03-10 SURGERY — INSERTION, TUNNELED CENTRAL VENOUS DEVICE, WITH PORT
Anesthesia: General | Site: Chest | Laterality: Right

## 2022-03-10 MED ORDER — CEFAZOLIN SODIUM-DEXTROSE 2-4 GM/100ML-% IV SOLN
2.0000 g | INTRAVENOUS | Status: AC
  Administered 2022-03-10: 2 g via INTRAVENOUS

## 2022-03-10 MED ORDER — CHLORHEXIDINE GLUCONATE CLOTH 2 % EX PADS: 6.0000 | MEDICATED_PAD | Freq: Once | CUTANEOUS | Status: DC

## 2022-03-10 MED ORDER — PROPOFOL 10 MG/ML IV BOLUS
INTRAVENOUS | Status: DC | PRN
  Administered 2022-03-10: 20 mg via INTRAVENOUS

## 2022-03-10 MED ORDER — CHLORHEXIDINE GLUCONATE 0.12 % MT SOLN
15.0000 mL | Freq: Once | OROMUCOSAL | Status: AC
  Administered 2022-03-10: 15 mL via OROMUCOSAL

## 2022-03-10 MED ORDER — PROPOFOL 500 MG/50ML IV EMUL
INTRAVENOUS | Status: DC | PRN
  Administered 2022-03-10: 30 ug/kg/min via INTRAVENOUS

## 2022-03-10 MED ORDER — PROPOFOL 10 MG/ML IV BOLUS
INTRAVENOUS | Status: AC
  Filled 2022-03-10: qty 20

## 2022-03-10 MED ORDER — FENTANYL CITRATE PF 50 MCG/ML IJ SOSY: 25.0000 ug | PREFILLED_SYRINGE | INTRAMUSCULAR | Status: DC | PRN

## 2022-03-10 MED ORDER — LIDOCAINE HCL (PF) 1 % IJ SOLN
INTRAMUSCULAR | Status: DC | PRN
  Administered 2022-03-10: 12 mL

## 2022-03-10 MED ORDER — FENTANYL CITRATE (PF) 100 MCG/2ML IJ SOLN
INTRAMUSCULAR | Status: DC | PRN
  Administered 2022-03-10 (×2): 25 ug via INTRAVENOUS

## 2022-03-10 MED ORDER — ONDANSETRON HCL 4 MG/2ML IJ SOLN: 4.0000 mg | Freq: Once | INTRAMUSCULAR | Status: DC | PRN

## 2022-03-10 MED ORDER — ORAL CARE MOUTH RINSE: 15.0000 mL | Freq: Once | OROMUCOSAL | Status: AC

## 2022-03-10 MED ORDER — LACTATED RINGERS IV SOLN: INTRAVENOUS | Status: DC

## 2022-03-10 MED ORDER — DIPHENHYDRAMINE HCL 50 MG/ML IJ SOLN
INTRAMUSCULAR | Status: DC | PRN
  Administered 2022-03-10: 12.5 mg via INTRAVENOUS

## 2022-03-10 MED ORDER — CEFAZOLIN SODIUM-DEXTROSE 2-4 GM/100ML-% IV SOLN
INTRAVENOUS | Status: AC
  Filled 2022-03-10: qty 100

## 2022-03-10 MED ORDER — HEPARIN SOD (PORK) LOCK FLUSH 100 UNIT/ML IV SOLN
INTRAVENOUS | Status: DC | PRN
  Administered 2022-03-10: 400 [IU] via INTRAVENOUS

## 2022-03-10 MED ORDER — ACETAMINOPHEN 500 MG PO TABS: 1000.0000 mg | ORAL_TABLET | Freq: Four times a day (QID) | ORAL | 0 refills | Status: AC

## 2022-03-10 MED ORDER — HEPARIN SOD (PORK) LOCK FLUSH 100 UNIT/ML IV SOLN
INTRAVENOUS | Status: AC
  Filled 2022-03-10: qty 5

## 2022-03-10 MED ORDER — LIDOCAINE HCL (PF) 1 % IJ SOLN
INTRAMUSCULAR | Status: AC
  Filled 2022-03-10: qty 30

## 2022-03-10 MED ORDER — OXYCODONE HCL 5 MG PO TABS: 5.0000 mg | ORAL_TABLET | Freq: Three times a day (TID) | ORAL | 0 refills | Status: DC | PRN

## 2022-03-10 MED ORDER — FENTANYL CITRATE (PF) 100 MCG/2ML IJ SOLN
INTRAMUSCULAR | Status: AC
  Filled 2022-03-10: qty 2

## 2022-03-10 MED ORDER — SODIUM CHLORIDE (PF) 0.9 % IJ SOLN
INTRAMUSCULAR | Status: DC | PRN
  Administered 2022-03-10: 500 mL via INTRAVENOUS

## 2022-03-10 SURGICAL SUPPLY — 35 items
APPLICATOR CHLORAPREP 10.5 ORG (MISCELLANEOUS) ×2 IMPLANT
APPLIER CLIP 9.375 SM OPEN (CLIP)
APR CLP SM 9.3 20 MLT OPN (CLIP)
BAG DECANTER FOR FLEXI CONT (MISCELLANEOUS) ×2 IMPLANT
BLADE SURG SZ11 CARB STEEL (BLADE) ×2 IMPLANT
CLIP APPLIE 9.375 SM OPEN (CLIP) IMPLANT
CLOTH BEACON ORANGE TIMEOUT ST (SAFETY) ×2 IMPLANT
COVER LIGHT HANDLE STERIS (MISCELLANEOUS) ×4 IMPLANT
COVER PROBE U/S 5X48 (MISCELLANEOUS) ×2 IMPLANT
DERMABOND ADVANCED (GAUZE/BANDAGES/DRESSINGS) ×1
DERMABOND ADVANCED .7 DNX12 (GAUZE/BANDAGES/DRESSINGS) ×1 IMPLANT
DRAPE C-ARM FOLDED MOBILE STRL (DRAPES) ×2 IMPLANT
ELECT REM PT RETURN 9FT ADLT (ELECTROSURGICAL) ×2
ELECTRODE REM PT RTRN 9FT ADLT (ELECTROSURGICAL) ×1 IMPLANT
GLOVE BIOGEL PI IND STRL 6.5 (GLOVE) ×1 IMPLANT
GLOVE BIOGEL PI IND STRL 7.0 (GLOVE) ×2 IMPLANT
GLOVE BIOGEL PI INDICATOR 6.5 (GLOVE) ×1
GLOVE BIOGEL PI INDICATOR 7.0 (GLOVE) ×2
GLOVE SURG SS PI 6.5 STRL IVOR (GLOVE) ×2 IMPLANT
GOWN STRL REUS W/TWL LRG LVL3 (GOWN DISPOSABLE) ×4 IMPLANT
IV NS 500ML (IV SOLUTION) ×2
IV NS 500ML BAXH (IV SOLUTION) ×1 IMPLANT
KIT PORT POWER 8FR ISP MRI (Port) ×2 IMPLANT
KIT TURNOVER KIT A (KITS) ×2 IMPLANT
MANIFOLD NEPTUNE II (INSTRUMENTS) ×2 IMPLANT
NDL HYPO 25X1 1.5 SAFETY (NEEDLE) ×1 IMPLANT
NEEDLE HYPO 25X1 1.5 SAFETY (NEEDLE) ×2 IMPLANT
PACK MINOR (CUSTOM PROCEDURE TRAY) ×2 IMPLANT
PAD ARMBOARD 7.5X6 YLW CONV (MISCELLANEOUS) ×2 IMPLANT
SET BASIN LINEN APH (SET/KITS/TRAYS/PACK) ×2 IMPLANT
SUT MNCRL AB 4-0 PS2 18 (SUTURE) ×2 IMPLANT
SUT VIC AB 3-0 SH 27 (SUTURE) ×2
SUT VIC AB 3-0 SH 27X BRD (SUTURE) ×1 IMPLANT
SYR 10ML LL (SYRINGE) ×4 IMPLANT
SYR CONTROL 10ML LL (SYRINGE) ×2 IMPLANT

## 2022-03-10 NOTE — Progress Notes (Signed)
Update Note:  Spoke with the patient's daughter preoperatively. I verified that she recently has received 1% lidocaine for her IR biopsy, and she had no issues at that time.  I attempted to call her twice postoperatively, but her phone went directly to voicemail.  I spoke with the patient in PACU, and explained that her procedure went well.  When she is awake enough, she will be discharged back to her facility.  Will await final radiology read on chest x-ray, but port appears to be appropriately positioned without pneumothorax.  Graciella Freer, DO [facility] [address] [place], [place] [address] [phone] (office)

## 2022-03-10 NOTE — Discharge Instructions (Signed)
Ambulatory Surgery Discharge Instructions  General Anesthesia or Sedation Do not drive or operate heavy machinery for 24 hours.  Do not consume alcohol, tranquilizers, sleeping medications, or any non-prescribed medications for 24 hours. Do not make important decisions or sign any important papers in the next 24 hours. You should have someone with you tonight at home.  Activity  You are advised to go directly home from the hospital.  Restrict your activities and rest for a day.  Resume light activity tomorrow. No heavy lifting over 10 lbs or strenuous exercise.  Fluids and Diet Begin with clear liquids, bouillon, dry toast, soda crackers.  If not nauseated, you may go to a regular diet when you desire.  Greasy and spicy foods are not advised.  Medications  If you have not had a bowel movement in 24 hours, take 2 tablespoons over the counter Milk of mag.             You May resume your blood thinners tomorrow (Aspirin, coumadin, or other).  You are being discharged with prescriptions for Opioid/Narcotic Medications: There are some specific considerations for these medications that you should know. Opioid Meds have risks & benefits. Addiction to these meds is always a concern with prolonged use Take medication only as directed Do not drive while taking narcotic pain medication Do not crush tablets or capsules Do not use a different container than medication was dispensed in Lock the container of medication in a cool, dry place out of reach of children and pets. Opioid medication can cause addiction Do not share with anyone else (this is a felony) Do not store medications for future use. Dispose of them properly.     Disposal:  Find a [place] household drug take back site near you.  If you can't get to a drug take back site, use the recipe below as a last resort to dispose of expired, unused or unwanted drugs. Disposal  (Do not dispose chemotherapy drugs this way, talk to your  prescribing doctor instead.) Step 1: Mix drugs (do not crush) with dirt, kitty litter, or used coffee grounds and add a small amount of water to dissolve any solid medications. Step 2: Seal drugs in plastic bag. Step 3: Place plastic bag in trash. Step 4: Take prescription container and scratch out personal information, then recycle or throw away.  Operative Site  You have a liquid bandage over your incisions, this will begin to flake off in about a week. Ok to shower tomorrow. Keep wound clean and dry. No baths or swimming. No lifting more than 10 pounds.  Contact Information: If you have questions or concerns, please call our office, [phone], Monday- Thursday 8AM-5PM and Friday 8AM-12Noon.  If it is after hours or on the weekend, please call [facility] Main Number, [phone], and ask to speak to the surgeon on call for Dr. Khalil Belote at [facility].   SPECIFIC COMPLICATIONS TO WATCH FOR: Inability to urinate Fever over 101? F by mouth Nausea and vomiting lasting longer than 24 hours. Pain not relieved by medication ordered Swelling around the operative site Increased redness, warmth, hardness, around operative area Numbness, tingling, or cold fingers or toes Blood -soaked dressing, (small amounts of oozing may be normal) Increasing and progressive drainage from surgical area or exam site  

## 2022-03-10 NOTE — Progress Notes (Addendum)
Patient handoff given to Olegario Shearer, nurse at [facility] prior to patient discharge.   Patient complained of itchy rash visible on right, anterior forearm and upper back.  Patient stated the rash was present prior to coming to [facility] for her procedure.  [facility] nurse was told about patient's complaint.

## 2022-03-10 NOTE — Anesthesia Procedure Notes (Signed)
Date/Time: 03/10/2022 8:41 AM Performed by: Karna Dupes, CRNA Pre-anesthesia Checklist: Patient identified, Emergency Drugs available, Suction available and Patient being monitored Oxygen Delivery Method: Nasal cannula

## 2022-03-10 NOTE — Interval H&P Note (Signed)
History and Physical Interval Note:  03/10/2022 8:22 AM  Faith Lee  has presented today for surgery, with the diagnosis of MANTEL CELL LYMPHOMA, MULTIPLE SITES.  The various methods of treatment have been discussed with the patient and family. After consideration of risks, benefits and other options for treatment, the patient has consented to  Procedure(s): INSERTION PORT-A-CATH, RIGHT IJ (Right) as a surgical intervention.  The patient's history has been reviewed, patient examined, no change in status, stable for surgery.  I have reviewed the patient's chart and labs.  Questions were answered to the patient's satisfaction.     [place]

## 2022-03-10 NOTE — Anesthesia Postprocedure Evaluation (Signed)
Anesthesia Post Note  Patient: Faith Lee  Procedure(s) Performed: INSERTION PORT-A-CATH, RIGHT IJ (Right: Chest)  Patient location during evaluation: Phase II Anesthesia Type: General Level of consciousness: awake and alert and oriented Pain management: pain level controlled Vital Signs Assessment: post-procedure vital signs reviewed and stable Respiratory status: spontaneous breathing, nonlabored ventilation and respiratory function stable Cardiovascular status: blood pressure returned to baseline and stable Postop Assessment: no apparent nausea or vomiting Anesthetic complications: no   No notable events documented.   Last Vitals:  Vitals:   03/10/22 1000 03/10/22 1013  BP: (!) 118/56 103/84  Pulse: 92 88  Resp: 12 16  Temp:  37 C  SpO2: 100% 100%    Last Pain:  Vitals:   03/10/22 1013  TempSrc: Oral  PainSc: 0-No pain                 Amiliana Foutz C [place] Thresher

## 2022-03-10 NOTE — Anesthesia Preprocedure Evaluation (Signed)
Anesthesia Evaluation  Patient identified by MRN, date of birth, ID band Patient awake    Reviewed: Allergy & Precautions, NPO status , Patient's Chart, lab work & pertinent test results, reviewed documented beta blocker date and time   Airway Mallampati: II  TM Distance: >3 FB Neck ROM: Full    Dental  (+) Dental Advisory Given, Missing   Pulmonary neg pulmonary ROS,    Pulmonary exam normal breath sounds clear to auscultation       Cardiovascular Exercise Tolerance: Poor hypertension, Pt. on medications and Pt. on home beta blockers Normal cardiovascular exam Rhythm:Regular Rate:Normal  18-Feb-2022 12:05:22 [facility] ROUTINE RECORD [id number] (89 yr) Female Black Vent. rate 84 BPM PR interval 164 ms QRS duration 82 ms QT/QTcB 354/418 ms P-R-T axes 61 19 116 Normal sinus rhythm Nonspecific T wave abnormality Abnormal ECG When compared with ECG of 01-Aug-2015 09:35, PREVIOUS ECG IS PRESENT   Neuro/Psych PSYCHIATRIC DISORDERS Anxiety Depression negative neurological ROS     GI/Hepatic negative GI ROS, Neg liver ROS,   Endo/Other  negative endocrine ROS  Renal/GU Renal InsufficiencyRenal disease  negative genitourinary   Musculoskeletal  (+) Arthritis , Osteoarthritis,    Abdominal   Peds negative pediatric ROS (+)  Hematology  (+) Blood dyscrasia (Lymphoma), anemia ,   Anesthesia Other Findings Chronic gout  Reproductive/Obstetrics negative OB ROS                            Anesthesia Physical Anesthesia Plan  ASA: 4  Anesthesia Plan: General   Post-op Pain Management: Minimal or no pain anticipated   Induction: Intravenous  PONV Risk Score and Plan: Propofol infusion  Airway Management Planned: Nasal Cannula and Natural Airway  Additional Equipment:   Intra-op Plan:   Post-operative Plan:   Informed Consent: I have reviewed the patients  History and Physical, chart, labs and discussed the procedure including the risks, benefits and alternatives for the proposed anesthesia with the patient or authorized representative who has indicated his/her understanding and acceptance.     Dental advisory given  Plan Discussed with: CRNA and Surgeon  Anesthesia Plan Comments:        Anesthesia Quick Evaluation

## 2022-03-10 NOTE — Progress Notes (Signed)
l °

## 2022-03-10 NOTE — Op Note (Signed)
Operative Note 03/10/22   Preoperative Diagnosis: Mantle Cell Lymphoma   Postoperative Diagnosis: Same   Procedure(s) Performed: Right IJ Port-A-Cath placement with ultrasound guidance   Surgeon: Graciella Freer, DO    Assistants: No qualified resident was available   Anesthesia: Monitored anesthesia care   Anesthesiologist: Dr. Charna Ambrielle   Specimens: None   Estimated Blood Loss: Minimal   Fluoroscopy time: 21 seconds   Blood Replacement: None    Complications: None    Operative Findings: Appropriately positioned Right IJ Port-a-cath  Indications: Patient is an 88 year old female who presents for Right IJ Port-a-cath insertion.  She was recently diagnosed with mantle cell lymphoma, and will need a port for chemotherapy treatments.  She is agreeable to the procedure.  All risks, benefits, and alternatives to Right IJ Port-a-cath insertion were discussed with the patient and her family, all of their questions were answered to their expressed satisfaction. The patient expresses she wishes to proceed, and informed consent was obtained.  Procedure: The patient was brought into the operating room and was induced.  One percent lidocaine was used for local anesthesia.   The right chest and neck was prepped and draped in the usual sterile fashion.  Preoperative antibiotics given.  Using ultrasound guidance, the skin was anesthetized.  Again with ultrasound guidance, the access needle was advanced into the internal jugular vein using the Seldinger technique without difficulty. A guidewire was then advanced into the right atrium under fluoroscopic guidance.  Ectopia was not noted. The guidewire was also verified to be in the internal jugular with ultrasound. The skin was knicked.  An incision was made below the right clavicle.  A subcutaneous pocket was formed. The catheter was then tunneled from the pocket to the access site in the neck.  An introducer and peel-away sheath were placed over  the guidewire. The catheter was then inserted through the peel-away sheath and the peel-away sheath was removed.  A spot film was performed to confirm the position. The catheter was then attached to the port and the port placed in subcutaneous pocket. Adequate positioning was confirmed by fluoroscopy. Hemostasis was confirmed.  Good backflow of blood was noted on aspiration of the port. The port was flushed with heparin flush. Subcutaneous layer was reapproximated using a 3-0 Vicryl interrupted suture. The skin was closed using a 4-0 Vicryl subcuticular suture. Dermabond was applied.    All tape and needle counts were correct at the end of the procedure. The patient was transferred to PACU in stable condition. A chest x-ray will be performed at that time.  Graciella Freer, DO  [facility] [address] [place], [place] [address] [phone] (office)

## 2022-03-10 NOTE — Transfer of Care (Addendum)
Immediate Anesthesia Transfer of Care Note  Patient: Faith Lee  Procedure(s) Performed: INSERTION PORT-A-CATH, RIGHT IJ (Right: Chest)  Patient Location: PACU  Anesthesia Type: Mac  Level of Consciousness: awake  Airway & Oxygen Therapy: Patient Spontanous Breathing  Post-op Assessment: Report given to RN and Post -op Vital signs reviewed and stable  Post vital signs: Reviewed and stable  Last Vitals:  Vitals Value Taken Time  BP    Temp    Pulse 91 03/10/22 0922  Resp 26 03/10/22 0922  SpO2 98 % 03/10/22 0922  Vitals shown include unvalidated device data.  Last Pain:  Vitals:   03/10/22 0729  TempSrc: Oral  PainSc: 0-No pain         Complications: No notable events documented.

## 2022-03-11 ENCOUNTER — Encounter (HOSPITAL_COMMUNITY): Payer: Self-pay | Admitting: Surgery

## 2022-03-16 NOTE — Progress Notes (Signed)
[place], [place] [address]   CLINIC:  Medical Oncology/Hematology  PCP:  Gerlene Fee, NP [address] [place] [address] [phone]   REASON FOR VISIT:  Follow-up for stage IVB mantle lymphoma  PRIOR THERAPY: none  CURRENT THERAPY: Intermittent IV Iron (Venofer)  BRIEF ONCOLOGIC HISTORY:  Oncology History  Mantle cell lymphoma ([place])  02/16/2022 Initial Diagnosis   Mantle cell lymphoma ([place])    02/18/2022 -  Chemotherapy   Patient is on Treatment Plan : NON-HODGKINS LYMPHOMA Rituximab D1 + Bendamustine D1,2 q28d x 6 cycles        CANCER STAGING:  Cancer Staging  Mantle cell lymphoma ([place]) Staging form: Hodgkin and Non-Hodgkin Lymphoma, AJCC 8th Edition - Clinical stage from 02/16/2022: Stage IV - Unsigned   INTERVAL HISTORY:  Faith Lee, a 88 y.o. female, returns for routine follow-up and consideration for next cycle of chemotherapy. Faith Lee was last seen on 02/16/2022.  Due for cycle #2 of Rituximab + Bendamustine today.   Overall, she tells me she has been feeling pretty well. ***  Overall, she feels ready for next cycle of chemo today.    REVIEW OF SYSTEMS:  Review of Systems  All other systems reviewed and are negative.  PAST MEDICAL/SURGICAL HISTORY:  Past Medical History:  Diagnosis Date   Anxiety    Hypertension    Irritable bowel disease    Osteoarthritis    Past Surgical History:  Procedure Laterality Date   ABDOMINAL HYSTERECTOMY  1974   CARDIAC CATHETERIZATION     1991 and 2005   PORTACATH PLACEMENT Right 03/10/2022   Procedure: INSERTION PORT-A-CATH, RIGHT IJ;  Surgeon: Rusty Aus, DO;  Location: [facility];  Service: General;  Laterality: Right;    SOCIAL HISTORY:  Social History   Socioeconomic History   Marital status: Widowed    Spouse name: Not on file   Number of children: Not on file   Years of education: Not on file   Highest education level: Not  on file  Occupational History   Not on file  Tobacco Use   Smoking status: Never   Smokeless tobacco: Never  Vaping Use   Vaping Use: Never used  Substance and Sexual Activity   Alcohol use: No   Drug use: No   Sexual activity: Not on file  Other Topics Concern   Not on file  Social History Narrative   Not on file   Social Determinants of Health   Financial Resource Strain: Low Risk    Difficulty of Paying Living Expenses: Not very hard  Food Insecurity: No Food Insecurity   Worried About Running Out of Food in the Last Year: Never true   Ran Out of Food in the Last Year: Never true  Transportation Needs: No Transportation Needs   Lack of Transportation (Medical): No   Lack of Transportation (Non-Medical): No  Physical Activity: Insufficiently Active   Days of Exercise per Week: 2 days   Minutes of Exercise per Session: 10 min  Stress: Stress Concern Present   Feeling of Stress : To some extent  Social Connections: Moderately Integrated   Frequency of Communication with Friends and Family: More than three times a week   Frequency of Social Gatherings with Friends and Family: Twice a week   Attends Religious Services: More than 4 times per year   Active Member of [employer] or Organizations: Yes   Attends Archivist Meetings: 1  to 4 times per year   Marital Status: Widowed  Catering manager Violence: Not At Risk   Fear of Current or Ex-Partner: No   Emotionally Abused: No   Physically Abused: No   Sexually Abused: No    FAMILY HISTORY:  Family History  Problem Relation Age of Onset   Renal Disease Sister    Multiple myeloma Sister     CURRENT MEDICATIONS:  Current Outpatient Medications  Medication Sig Dispense Refill   allopurinol (ZYLOPRIM) 300 MG tablet Take 1 tablet (300 mg total) by mouth daily. (Patient not taking: Reported on 03/04/2022) 30 tablet 6   docusate sodium (COLACE) 100 MG capsule Take 1 capsule (100 mg total) by mouth at bedtime. May take  2 capsules if needed. 60 capsule 0   Ensure Max Protein (ENSURE MAX PROTEIN) LIQD due to increased protein/energy needs related to cancer treatments Three Times A Day     furosemide (LASIX) 20 MG tablet Take 20 mg by mouth daily.     LORazepam (ATIVAN) 0.5 MG tablet Take 1 tablet (0.5 mg total) by mouth 2 (two) times daily. 60 tablet 0   losartan (COZAAR) 100 MG tablet Take 100 mg by mouth daily.     metoprolol tartrate (LOPRESSOR) 25 MG tablet Take 25 mg by mouth 2 (two) times daily.     mirtazapine (REMERON) 7.5 MG tablet Take 7.5 mg by mouth at bedtime.     oxyCODONE (ROXICODONE) 5 MG immediate release tablet Take 1 tablet (5 mg total) by mouth every 8 (eight) hours as needed. 10 tablet 0   No current facility-administered medications for this visit.    ALLERGIES:  Allergies  Allergen Reactions   Lidocaine Other (See Comments) and Hypertension    Patient had chest tightness with stomach pain, numbness on left side of limbs and unable to speak  PT received lidocaine 03-10-22 for port insertion with no reaction   Rituximab-Pvvr Other (See Comments)    Patient complained of chest pressure/pain     PHYSICAL EXAM:  Performance status (ECOG): 1 - Symptomatic but completely ambulatory  There were no vitals filed for this visit. Wt Readings from Last 3 Encounters:  03/05/22 151 lb 6.4 oz (68.7 kg)  03/04/22 151 lb 3.8 oz (68.6 kg)  03/02/22 153 lb (69.4 kg)   Physical Exam  LABORATORY DATA:  I have reviewed the labs as listed.     Latest Ref Rng & Units 03/04/2022   10:53 AM 02/25/2022   10:47 AM 02/18/2022    8:28 AM  CBC  WBC 4.0 - 10.5 K/uL 11.3   18.5   61.7    Hemoglobin 12.0 - 15.0 g/dL 7.7   7.7   7.7    Hematocrit 36.0 - 46.0 % 24.6   24.4   23.5    Platelets 150 - 400 K/uL 127   117   157        Latest Ref Rng & Units 03/04/2022   10:53 AM 02/25/2022   10:47 AM 02/19/2022    9:51 AM  CMP  Glucose 70 - 99 mg/dL 878   888   389    BUN 8 - 23 mg/dL 22   37   37     Creatinine 0.44 - 1.00 mg/dL 5.36   1.53   3.19    Sodium 135 - 145 mmol/L 138   138   132    Potassium 3.5 - 5.1 mmol/L 4.2   5.0   5.4  Chloride 98 - 111 mmol/L 103   108   100    CO2 22 - 32 mmol/L $RemoveB'28   25   22    'zVKnRpZK$ Calcium 8.9 - 10.3 mg/dL 8.3   8.4   7.9    Total Protein 6.5 - 8.1 g/dL 6.0   5.6   5.7    Total Bilirubin 0.3 - 1.2 mg/dL 0.4   0.6   0.6    Alkaline Phos 38 - 126 U/L 44   44   53    AST 15 - 41 U/L 28   27   51    ALT 0 - 44 U/L $Remo'14   15   17      'elSWJ$ DIAGNOSTIC IMAGING:  I have independently reviewed the scans and discussed with the patient. DG Chest 1 View  Result Date: 03/10/2022 CLINICAL DATA:  Right-sided Port-A-Cath placement EXAM: CHEST  1 VIEW COMPARISON:  03/10/2021 FINDINGS: Cardiomegaly. Right chest port catheter, tip positioned over the superior vena cava. Both lungs are clear. The visualized skeletal structures are unremarkable. IMPRESSION: 1.  Cardiomegaly without acute abnormality of the lungs. 2. Right chest port catheter, tip positioned over the superior vena cava. Electronically Signed   By: Delanna Ahmadi M.D.   On: 03/10/2022 09:52   DG C-Arm 1-60 Min-No Report  Result Date: 03/10/2022 Fluoroscopy was utilized by the requesting physician.  No radiographic interpretation.     ASSESSMENT:  Stage IVb mantle cell lymphoma: - Flow cytometry on 05/10/2021 with monoclonal population of B cells with expression of CD5 with no significant CD20 expression.  Small to medium sized lymphocytes with coarse chromatin and frequent prominent nucleoli.  Differential includes atypical CLL versus mantle cell lymphoma. - FISH panel on 05/26/2021 shows 49% nuclei positive for 3 signals of CCND1 and 58% of nuclei positive for TP53 gene deletion. 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 - PET scan (01/21/2022): Marked splenomegaly and hypermetabolism.  Hypermetabolic supraclavicular, mediastinal, bilateral hilar and abdominal adenopathy.    Social/family history: - She lives  by herself at home.  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  She is accompanied by her daughter today.  He was non-smoker. - 2 of her sisters had multiple myeloma.   PLAN:  Stage IVb mantle cell lymphoma: - Bone marrow biopsy results were discussed which showed mantle cell lymphoma. - Supraclavicular lymph node biopsy showed mantle cell lymphoma. 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  Also multiple abnormalities consistent with complex karyotype. - We discussed new diagnosis and prognosis in detail with the patient and her daughter.Dr. Willey Blade is planning to place her in a nursing home.  We discussed further management with chemotherapy and rituximab as soon as possible.  She reported that her functional status has been declining rapidly. - She had recent antibiotic treatment for UTI with Bactrim on 02/04/2022.  She is having diarrhea since 02/12/2022. - Recommend C. difficile testing. - We talked about chemoimmunotherapy with Bendamustine and rituximab given every 28 days for 6 cycles. - We will request a port placement. - Due to her rapid deterioration, I have recommended starting chemotherapy this week and will arrange for port prior to cycle 2. - She will be evaluated in symptom management clinic next week with tumor lysis labs.   Normocytic anemia: - Last CBC on 02/05/2022 with hemoglobin 7.5, due to infiltration from lymphoma.  3.  TLS prophylaxis: - Continue allopurinol daily.  We will  give her rasburicase with cycle 1.  4.  Lower extremity swelling: - She still has 2+ edema in the legs.  Continue Lasix 20 mg daily.   Orders placed this encounter:  No orders of the defined types were placed in this encounter.    Derek Jack, MD [place] [phone]   I, Thana Ates, am acting as a scribe for Dr. Derek Jack.  {Add Barista Statement}

## 2022-03-18 ENCOUNTER — Other Ambulatory Visit: Payer: Self-pay | Admitting: Adult Health

## 2022-03-18 ENCOUNTER — Inpatient Hospital Stay (HOSPITAL_BASED_OUTPATIENT_CLINIC_OR_DEPARTMENT_OTHER): Payer: Medicare Other | Admitting: Hematology

## 2022-03-18 ENCOUNTER — Inpatient Hospital Stay (HOSPITAL_COMMUNITY): Payer: Medicare Other | Attending: Hematology

## 2022-03-18 ENCOUNTER — Inpatient Hospital Stay (HOSPITAL_COMMUNITY): Payer: Medicare Other

## 2022-03-18 ENCOUNTER — Inpatient Hospital Stay (HOSPITAL_COMMUNITY): Payer: Medicare Other | Admitting: Dietician

## 2022-03-18 VITALS — BP 107/68 | HR 99 | Temp 98.1°F | Resp 18 | Wt 142.8 lb

## 2022-03-18 DIAGNOSIS — C8318 Mantle cell lymphoma, lymph nodes of multiple sites: Secondary | ICD-10-CM

## 2022-03-18 DIAGNOSIS — M7989 Other specified soft tissue disorders: Secondary | ICD-10-CM | POA: Insufficient documentation

## 2022-03-18 DIAGNOSIS — C8310 Mantle cell lymphoma, unspecified site: Secondary | ICD-10-CM | POA: Diagnosis not present

## 2022-03-18 DIAGNOSIS — R634 Abnormal weight loss: Secondary | ICD-10-CM | POA: Diagnosis not present

## 2022-03-18 DIAGNOSIS — D649 Anemia, unspecified: Secondary | ICD-10-CM | POA: Diagnosis not present

## 2022-03-18 DIAGNOSIS — Z807 Family history of other malignant neoplasms of lymphoid, hematopoietic and related tissues: Secondary | ICD-10-CM | POA: Insufficient documentation

## 2022-03-18 DIAGNOSIS — I10 Essential (primary) hypertension: Secondary | ICD-10-CM | POA: Insufficient documentation

## 2022-03-18 DIAGNOSIS — Z95828 Presence of other vascular implants and grafts: Secondary | ICD-10-CM

## 2022-03-18 LAB — CBC WITH DIFFERENTIAL/PLATELET
Abs Immature Granulocytes: 0.06 10*3/uL (ref 0.00–0.07)
Basophils Absolute: 0.1 10*3/uL (ref 0.0–0.1)
Basophils Relative: 0 %
Eosinophils Absolute: 1.5 10*3/uL — ABNORMAL HIGH (ref 0.0–0.5)
Eosinophils Relative: 7 %
HCT: 24.7 % — ABNORMAL LOW (ref 36.0–46.0)
Hemoglobin: 7.9 g/dL — ABNORMAL LOW (ref 12.0–15.0)
Immature Granulocytes: 0 %
Lymphocytes Relative: 61 %
Lymphs Abs: 13.6 10*3/uL — ABNORMAL HIGH (ref 0.7–4.0)
MCH: 33.5 pg (ref 26.0–34.0)
MCHC: 32 g/dL (ref 30.0–36.0)
MCV: 104.7 fL — ABNORMAL HIGH (ref 80.0–100.0)
Monocytes Absolute: 4.1 10*3/uL — ABNORMAL HIGH (ref 0.1–1.0)
Monocytes Relative: 18 %
Neutro Abs: 3.1 10*3/uL (ref 1.7–7.7)
Neutrophils Relative %: 14 %
Platelets: 88 10*3/uL — ABNORMAL LOW (ref 150–400)
RBC: 2.36 MIL/uL — ABNORMAL LOW (ref 3.87–5.11)
RDW: 16.8 % — ABNORMAL HIGH (ref 11.5–15.5)
WBC: 22.4 10*3/uL — ABNORMAL HIGH (ref 4.0–10.5)
nRBC: 0 % (ref 0.0–0.2)

## 2022-03-18 LAB — COMPREHENSIVE METABOLIC PANEL
ALT: 11 U/L (ref 0–44)
AST: 27 U/L (ref 15–41)
Albumin: 3.3 g/dL — ABNORMAL LOW (ref 3.5–5.0)
Alkaline Phosphatase: 46 U/L (ref 38–126)
Anion gap: 6 (ref 5–15)
BUN: 77 mg/dL — ABNORMAL HIGH (ref 8–23)
CO2: 26 mmol/L (ref 22–32)
Calcium: 7.9 mg/dL — ABNORMAL LOW (ref 8.9–10.3)
Chloride: 105 mmol/L (ref 98–111)
Creatinine, Ser: 2.3 mg/dL — ABNORMAL HIGH (ref 0.44–1.00)
GFR, Estimated: 20 mL/min — ABNORMAL LOW (ref >60)
Glucose, Bld: 105 mg/dL — ABNORMAL HIGH (ref 70–99)
Potassium: 4.5 mmol/L (ref 3.5–5.1)
Sodium: 137 mmol/L (ref 135–145)
Total Bilirubin: 0.7 mg/dL (ref 0.3–1.2)
Total Protein: 5.3 g/dL — ABNORMAL LOW (ref 6.5–8.1)

## 2022-03-18 LAB — MAGNESIUM: Magnesium: 2.8 mg/dL — ABNORMAL HIGH (ref 1.7–2.4)

## 2022-03-18 LAB — SAMPLE TO BLOOD BANK

## 2022-03-18 LAB — URIC ACID: Uric Acid, Serum: 10.8 mg/dL — ABNORMAL HIGH (ref 2.5–7.1)

## 2022-03-18 LAB — LACTATE DEHYDROGENASE: LDH: 371 U/L — ABNORMAL HIGH (ref 98–192)

## 2022-03-18 LAB — PHOSPHORUS: Phosphorus: 3.5 mg/dL (ref 2.5–4.6)

## 2022-03-18 MED ORDER — HEPARIN SOD (PORK) LOCK FLUSH 100 UNIT/ML IV SOLN
500.0000 [IU] | Freq: Once | INTRAVENOUS | Status: AC
  Administered 2022-03-18: 500 [IU] via INTRAVENOUS

## 2022-03-18 MED ORDER — SODIUM CHLORIDE 0.9 % IV SOLN: INTRAVENOUS | Status: DC

## 2022-03-18 MED ORDER — DRONABINOL 2.5 MG PO CAPS: 2.5000 mg | ORAL_CAPSULE | Freq: Every day | ORAL | 0 refills | Status: DC

## 2022-03-18 MED ORDER — SODIUM CHLORIDE 0.9% FLUSH
10.0000 mL | INTRAVENOUS | Status: DC | PRN
  Administered 2022-03-18 (×2): 10 mL via INTRAVENOUS

## 2022-03-18 NOTE — Progress Notes (Signed)
Patient tolerated hydration therapy with no complaints voiced. Side effects with management reviewed with understanding verbalized. Port site clean and dry with no bruising or swelling noted at site. Good blood return noted before and after administration of therapy. Band aid applied. Patient left in satisfactory condition with VSS and no s/s of distress noted. 

## 2022-03-18 NOTE — Progress Notes (Signed)
Nutrition Follow-up:  with stage IV mantle cell lymphoma and normocytic anemia.  She is currently receiving Bendamustine + Rituximab q28d (first treatment 5/4) as well as intermittent Venofer.   Treatment held today secondary to lab results revealed elevated kidney function. Patient receiving IV fluids.   Met with patient in infusion. She reports poor appetite. Patient reports staff at nursing facility are doing their best with the food, but nothing taste right except for bananas. Patient recalls cabbage as a favorite food. She had this recently and it did not taste like any cabbage she has ever had. It was horrible. Patient reports she is unable to chew meat. She reports getting ice cream frequently which she likes. Patient reports drinking Ensure supplements, thinks twice daily. Patient unable to tolerate chocolate flavor. This runs right through her as anything chocolate does. Patient shares she does not like feeling helpless. She cared for son who passed in 1999 due to failing kidneys for many years followed by her husband. Patient states she tried her best and was able to keep him out of the nursing home.   Medications: reviewed   Labs: Mg 2.8, glucose 105, BUN 77, Cr 2.30  Anthropometrics: Weight 142 lb 12.8 oz today decreased 9 lbs (6%) in 2 weeks; significant (lasix 20 mg/day)  5/19 - 151 lb 6.4 oz    NUTRITION DIAGNOSIS: Inadequate oral intake    INTERVENTION:  Suggest baking soda salt water rinses several times daily before meals  Appetite stimulant per MD Continue drinking Ensure Plus/equivalent, recommend 3x/day in between meals (strawberry/vanilla)  Continued communication with [facility] Supportive listening and encouragement provided     MONITORING, EVALUATION, GOAL: weight trends, intake    NEXT VISIT: To be scheduled

## 2022-03-18 NOTE — Patient Instructions (Addendum)
[place] at [facility] Discharge Instructions   You were seen and examined today by Dr. Delton Coombes.  He reviewed your lab work. Your kidney function is high and has doubled since we last checked. We will hold treatment today. We will give IV fluids today.   Hold Cozaar for blood pressure as this can affect your kidney function and cause it to be high.  You've lost 9 lbs since we last saw you. We will send a prescription for an appetite stimulant medication.   Return as scheduled in 1 week for reevaluation.    Thank you for choosing [place] at [facility] to provide your oncology and hematology care.  To afford each patient quality time with our provider, please arrive at least 15 minutes before your scheduled appointment time.   If you have a lab appointment with the [place] please come in thru the Main Entrance and check in at the main information desk.  You need to re-schedule your appointment should you arrive 10 or more minutes late.  We strive to give you quality time with our providers, and arriving late affects you and other patients whose appointments are after yours.  Also, if you no show three or more times for appointments you may be dismissed from the clinic at the providers discretion.     Again, thank you for choosing [facility]  Our hope is that these requests will decrease the amount of time that you wait before being seen by our physicians.       _____________________________________________________________  Should you have questions after your visit to [facility], please contact our office at [phone] and follow the prompts.  Our office hours are 8:00 a.m. and 4:30 p.m. Monday - Friday.  Please note that voicemails left after 4:00 p.m. may not be returned until the following business day.  We are closed weekends and major holidays.  You do have access to a nurse 24-7, just call the  main number to the clinic [phone] and do not press any options, hold on the line and a nurse will answer the phone.    For prescription refill requests, have your pharmacy contact our office and allow 72 hours.    Due to Covid, you will need to wear a mask upon entering the hospital. If you do not have a mask, a mask will be given to you at the Main Entrance upon arrival. For doctor visits, patients may have 1 support person age 60 or older with them. For treatment visits, patients can not have anyone with them due to social distancing guidelines and our immunocompromised population.

## 2022-03-18 NOTE — Progress Notes (Signed)
Spoke with Deana at [facility]. Instructed per Dr. Tomie China orders to start Megace 400 mg bid for appetite stimulation d/t 9 lb weight loss; to STOP Cozaar d/t elevated creatinine; and to ensure pt is taking allopurinol 300 mg daily for elevated uric acid. Orders read back and repeated. Per Deana, she will have their NP approve these orders and proceed with above.

## 2022-03-18 NOTE — Patient Instructions (Signed)
[place]  Discharge Instructions: Thank you for choosing [facility] to provide your oncology and hematology care.  If you have a lab appointment with the [facility], please come in thru the Main Entrance and check in at the main information desk.  Wear comfortable clothing and clothing appropriate for easy access to any Portacath or PICC line.   We strive to give you quality time with your provider. You may need to reschedule your appointment if you arrive late (15 or more minutes).  Arriving late affects you and other patients whose appointments are after yours.  Also, if you miss three or more appointments without notifying the office, you may be dismissed from the clinic at the provider's discretion.      For prescription refill requests, have your pharmacy contact our office and allow 72 hours for refills to be completed.    Today you received the following 1 L of normal Saline, return as scheduled.   To help prevent nausea and vomiting after your treatment, we encourage you to take your nausea medication as directed.  BELOW ARE SYMPTOMS THAT SHOULD BE REPORTED IMMEDIATELY: *FEVER GREATER THAN 100.4 F (38 C) OR HIGHER *CHILLS OR SWEATING *NAUSEA AND VOMITING THAT IS NOT CONTROLLED WITH YOUR NAUSEA MEDICATION *UNUSUAL SHORTNESS OF BREATH *UNUSUAL BRUISING OR BLEEDING *URINARY PROBLEMS (pain or burning when urinating, or frequent urination) *BOWEL PROBLEMS (unusual diarrhea, constipation, pain near the anus) TENDERNESS IN MOUTH AND THROAT WITH OR WITHOUT PRESENCE OF ULCERS (sore throat, sores in mouth, or a toothache) UNUSUAL RASH, SWELLING OR PAIN  UNUSUAL VAGINAL DISCHARGE OR ITCHING   Items with * indicate a potential emergency and should be followed up as soon as possible or go to the Emergency Department if any problems should occur.  Please show the CHEMOTHERAPY ALERT CARD or IMMUNOTHERAPY ALERT CARD at check-in to the Emergency Department and  triage nurse.  Should you have questions after your visit or need to cancel or reschedule your appointment, please contact [facility] [phone]  and follow the prompts.  Office hours are 8:00 a.m. to 4:30 p.m. Monday - Friday. Please note that voicemails left after 4:00 p.m. may not be returned until the following business day.  We are closed weekends and major holidays. You have access to a nurse at all times for urgent questions. Please call the main number to the clinic [phone] and follow the prompts.  For any non-urgent questions, you may also contact your provider using MyChart. We now offer e-Visits for anyone 59 and older to request care online for non-urgent symptoms. For details visit [email or web address].   Also download the MyChart app! Go to the app store, search "MyChart", open the app, select [facility], and log in with your MyChart username and password.  Due to Covid, a mask is required upon entering the hospital/clinic. If you do not have a mask, one will be given to you upon arrival. For doctor visits, patients may have 1 support person aged 25 or older with them. For treatment visits, patients cannot have anyone with them due to current Covid guidelines and our immunocompromised population.

## 2022-03-19 ENCOUNTER — Inpatient Hospital Stay (HOSPITAL_COMMUNITY): Payer: Medicare Other

## 2022-03-21 ENCOUNTER — Inpatient Hospital Stay (HOSPITAL_COMMUNITY)
Admission: EM | Admit: 2022-03-21 | Discharge: 2022-03-28 | DRG: 871 | Disposition: A | Discharge status: Skilled Nursing Facility | Payer: Medicare Other | Source: Skilled Nursing Facility | Attending: Family Medicine | Admitting: Family Medicine

## 2022-03-21 ENCOUNTER — Encounter (HOSPITAL_COMMUNITY): Payer: Self-pay | Admitting: Emergency Medicine

## 2022-03-21 ENCOUNTER — Emergency Department (HOSPITAL_COMMUNITY): Payer: Medicare Other

## 2022-03-21 ENCOUNTER — Other Ambulatory Visit: Payer: Self-pay

## 2022-03-21 DIAGNOSIS — R5383 Other fatigue: Secondary | ICD-10-CM | POA: Diagnosis not present

## 2022-03-21 DIAGNOSIS — K5730 Diverticulosis of large intestine without perforation or abscess without bleeding: Secondary | ICD-10-CM | POA: Diagnosis not present

## 2022-03-21 DIAGNOSIS — C8310 Mantle cell lymphoma, unspecified site: Secondary | ICD-10-CM | POA: Diagnosis present

## 2022-03-21 DIAGNOSIS — K2970 Gastritis, unspecified, without bleeding: Secondary | ICD-10-CM | POA: Diagnosis not present

## 2022-03-21 DIAGNOSIS — B3781 Candidal esophagitis: Secondary | ICD-10-CM | POA: Diagnosis present

## 2022-03-21 DIAGNOSIS — R29818 Other symptoms and signs involving the nervous system: Secondary | ICD-10-CM | POA: Diagnosis not present

## 2022-03-21 DIAGNOSIS — R634 Abnormal weight loss: Secondary | ICD-10-CM | POA: Diagnosis not present

## 2022-03-21 DIAGNOSIS — N250 Renal osteodystrophy: Secondary | ICD-10-CM | POA: Diagnosis not present

## 2022-03-21 DIAGNOSIS — N390 Urinary tract infection, site not specified: Secondary | ICD-10-CM | POA: Diagnosis not present

## 2022-03-21 DIAGNOSIS — N2889 Other specified disorders of kidney and ureter: Secondary | ICD-10-CM | POA: Diagnosis present

## 2022-03-21 DIAGNOSIS — N1832 Chronic kidney disease, stage 3b: Secondary | ICD-10-CM | POA: Diagnosis not present

## 2022-03-21 DIAGNOSIS — D696 Thrombocytopenia, unspecified: Secondary | ICD-10-CM | POA: Diagnosis present

## 2022-03-21 DIAGNOSIS — R195 Other fecal abnormalities: Secondary | ICD-10-CM | POA: Diagnosis not present

## 2022-03-21 DIAGNOSIS — Z20822 Contact with and (suspected) exposure to covid-19: Secondary | ICD-10-CM | POA: Diagnosis not present

## 2022-03-21 DIAGNOSIS — N184 Chronic kidney disease, stage 4 (severe): Secondary | ICD-10-CM | POA: Diagnosis present

## 2022-03-21 DIAGNOSIS — K922 Gastrointestinal hemorrhage, unspecified: Secondary | ICD-10-CM | POA: Diagnosis not present

## 2022-03-21 DIAGNOSIS — K2950 Unspecified chronic gastritis without bleeding: Secondary | ICD-10-CM | POA: Diagnosis not present

## 2022-03-21 DIAGNOSIS — Z66 Do not resuscitate: Secondary | ICD-10-CM | POA: Diagnosis not present

## 2022-03-21 DIAGNOSIS — C8319 Mantle cell lymphoma, extranodal and solid organ sites: Secondary | ICD-10-CM | POA: Diagnosis not present

## 2022-03-21 DIAGNOSIS — D649 Anemia, unspecified: Secondary | ICD-10-CM

## 2022-03-21 DIAGNOSIS — G928 Other toxic encephalopathy: Secondary | ICD-10-CM | POA: Diagnosis present

## 2022-03-21 DIAGNOSIS — E860 Dehydration: Secondary | ICD-10-CM | POA: Diagnosis not present

## 2022-03-21 DIAGNOSIS — R509 Fever, unspecified: Principal | ICD-10-CM

## 2022-03-21 DIAGNOSIS — I10 Essential (primary) hypertension: Secondary | ICD-10-CM

## 2022-03-21 DIAGNOSIS — K2951 Unspecified chronic gastritis with bleeding: Secondary | ICD-10-CM | POA: Diagnosis present

## 2022-03-21 DIAGNOSIS — K5731 Diverticulosis of large intestine without perforation or abscess with bleeding: Secondary | ICD-10-CM | POA: Diagnosis present

## 2022-03-21 DIAGNOSIS — Z841 Family history of disorders of kidney and ureter: Secondary | ICD-10-CM

## 2022-03-21 DIAGNOSIS — R531 Weakness: Secondary | ICD-10-CM | POA: Diagnosis not present

## 2022-03-21 DIAGNOSIS — I1310 Hypertensive heart and chronic kidney disease without heart failure, with stage 1 through stage 4 chronic kidney disease, or unspecified chronic kidney disease: Secondary | ICD-10-CM | POA: Diagnosis not present

## 2022-03-21 DIAGNOSIS — K8020 Calculus of gallbladder without cholecystitis without obstruction: Secondary | ICD-10-CM | POA: Diagnosis present

## 2022-03-21 DIAGNOSIS — L89152 Pressure ulcer of sacral region, stage 2: Secondary | ICD-10-CM | POA: Diagnosis present

## 2022-03-21 DIAGNOSIS — K3189 Other diseases of stomach and duodenum: Secondary | ICD-10-CM | POA: Diagnosis not present

## 2022-03-21 DIAGNOSIS — N3289 Other specified disorders of bladder: Secondary | ICD-10-CM | POA: Diagnosis not present

## 2022-03-21 DIAGNOSIS — I129 Hypertensive chronic kidney disease with stage 1 through stage 4 chronic kidney disease, or unspecified chronic kidney disease: Secondary | ICD-10-CM | POA: Diagnosis not present

## 2022-03-21 DIAGNOSIS — J90 Pleural effusion, not elsewhere classified: Secondary | ICD-10-CM | POA: Diagnosis not present

## 2022-03-21 DIAGNOSIS — R197 Diarrhea, unspecified: Secondary | ICD-10-CM

## 2022-03-21 DIAGNOSIS — M6281 Muscle weakness (generalized): Secondary | ICD-10-CM | POA: Diagnosis not present

## 2022-03-21 DIAGNOSIS — R54 Age-related physical debility: Secondary | ICD-10-CM | POA: Diagnosis present

## 2022-03-21 DIAGNOSIS — D62 Acute posthemorrhagic anemia: Secondary | ICD-10-CM | POA: Diagnosis present

## 2022-03-21 DIAGNOSIS — N183 Chronic kidney disease, stage 3 unspecified: Secondary | ICD-10-CM | POA: Diagnosis not present

## 2022-03-21 DIAGNOSIS — Z6824 Body mass index (BMI) 24.0-24.9, adult: Secondary | ICD-10-CM

## 2022-03-21 DIAGNOSIS — C8318 Mantle cell lymphoma, lymph nodes of multiple sites: Secondary | ICD-10-CM

## 2022-03-21 DIAGNOSIS — R6510 Systemic inflammatory response syndrome (SIRS) of non-infectious origin without acute organ dysfunction: Secondary | ICD-10-CM | POA: Diagnosis not present

## 2022-03-21 DIAGNOSIS — R630 Anorexia: Secondary | ICD-10-CM | POA: Diagnosis present

## 2022-03-21 DIAGNOSIS — N179 Acute kidney failure, unspecified: Secondary | ICD-10-CM | POA: Diagnosis not present

## 2022-03-21 DIAGNOSIS — K648 Other hemorrhoids: Secondary | ICD-10-CM | POA: Diagnosis not present

## 2022-03-21 DIAGNOSIS — E872 Acidosis, unspecified: Secondary | ICD-10-CM | POA: Diagnosis present

## 2022-03-21 DIAGNOSIS — Z515 Encounter for palliative care: Secondary | ICD-10-CM

## 2022-03-21 DIAGNOSIS — N189 Chronic kidney disease, unspecified: Secondary | ICD-10-CM | POA: Diagnosis not present

## 2022-03-21 DIAGNOSIS — N170 Acute kidney failure with tubular necrosis: Secondary | ICD-10-CM | POA: Diagnosis not present

## 2022-03-21 DIAGNOSIS — A419 Sepsis, unspecified organism: Principal | ICD-10-CM | POA: Diagnosis present

## 2022-03-21 DIAGNOSIS — I700 Atherosclerosis of aorta: Secondary | ICD-10-CM | POA: Diagnosis not present

## 2022-03-21 DIAGNOSIS — L8990 Pressure ulcer of unspecified site, unspecified stage: Secondary | ICD-10-CM | POA: Insufficient documentation

## 2022-03-21 DIAGNOSIS — Z888 Allergy status to other drugs, medicaments and biological substances status: Secondary | ICD-10-CM

## 2022-03-21 DIAGNOSIS — F411 Generalized anxiety disorder: Secondary | ICD-10-CM | POA: Diagnosis present

## 2022-03-21 DIAGNOSIS — Z79899 Other long term (current) drug therapy: Secondary | ICD-10-CM

## 2022-03-21 DIAGNOSIS — D509 Iron deficiency anemia, unspecified: Secondary | ICD-10-CM | POA: Diagnosis not present

## 2022-03-21 DIAGNOSIS — K589 Irritable bowel syndrome without diarrhea: Secondary | ICD-10-CM | POA: Diagnosis present

## 2022-03-21 DIAGNOSIS — R000 Tachycardia, unspecified: Secondary | ICD-10-CM | POA: Diagnosis not present

## 2022-03-21 DIAGNOSIS — Z807 Family history of other malignant neoplasms of lymphoid, hematopoietic and related tissues: Secondary | ICD-10-CM

## 2022-03-21 HISTORY — DX: Disorder of kidney and ureter, unspecified: N28.9

## 2022-03-21 HISTORY — DX: Irritable bowel syndrome, unspecified: K58.9

## 2022-03-21 HISTORY — DX: Generalized anxiety disorder: F41.1

## 2022-03-21 HISTORY — DX: Malignant (primary) neoplasm, unspecified: C80.1

## 2022-03-21 LAB — URINALYSIS, ROUTINE W REFLEX MICROSCOPIC
Bilirubin Urine: NEGATIVE
Glucose, UA: NEGATIVE mg/dL
Hgb urine dipstick: NEGATIVE
Ketones, ur: 5 mg/dL — AB
Leukocytes,Ua: NEGATIVE
Nitrite: NEGATIVE
Protein, ur: 100 mg/dL — AB
Specific Gravity, Urine: 1.017 (ref 1.005–1.030)
pH: 5 (ref 5.0–8.0)

## 2022-03-21 LAB — CBC WITH DIFFERENTIAL/PLATELET
Abs Immature Granulocytes: 0.05 10*3/uL (ref 0.00–0.07)
Basophils Absolute: 0 10*3/uL (ref 0.0–0.1)
Basophils Relative: 0 %
Eosinophils Absolute: 0.3 10*3/uL (ref 0.0–0.5)
Eosinophils Relative: 2 %
HCT: 23.5 % — ABNORMAL LOW (ref 36.0–46.0)
Hemoglobin: 6.9 g/dL — CL (ref 12.0–15.0)
Immature Granulocytes: 0 %
Lymphocytes Relative: 60 %
Lymphs Abs: 7.3 10*3/uL — ABNORMAL HIGH (ref 0.7–4.0)
MCH: 33 pg (ref 26.0–34.0)
MCHC: 29.4 g/dL — ABNORMAL LOW (ref 30.0–36.0)
MCV: 112.4 fL — ABNORMAL HIGH (ref 80.0–100.0)
Monocytes Absolute: 2.5 10*3/uL — ABNORMAL HIGH (ref 0.1–1.0)
Monocytes Relative: 20 %
Neutro Abs: 2.3 10*3/uL (ref 1.7–7.7)
Neutrophils Relative %: 18 %
Platelets: 105 10*3/uL — ABNORMAL LOW (ref 150–400)
RBC: 2.09 MIL/uL — ABNORMAL LOW (ref 3.87–5.11)
RDW: 16.9 % — ABNORMAL HIGH (ref 11.5–15.5)
WBC Morphology: ABNORMAL
WBC: 12.5 10*3/uL — ABNORMAL HIGH (ref 4.0–10.5)
nRBC: 0.3 % — ABNORMAL HIGH (ref 0.0–0.2)

## 2022-03-21 LAB — COMPREHENSIVE METABOLIC PANEL
ALT: 13 U/L (ref 0–44)
AST: 32 U/L (ref 15–41)
Albumin: 3.4 g/dL — ABNORMAL LOW (ref 3.5–5.0)
Alkaline Phosphatase: 38 U/L (ref 38–126)
Anion gap: 7 (ref 5–15)
BUN: 67 mg/dL — ABNORMAL HIGH (ref 8–23)
CO2: 24 mmol/L (ref 22–32)
Calcium: 7.9 mg/dL — ABNORMAL LOW (ref 8.9–10.3)
Chloride: 112 mmol/L — ABNORMAL HIGH (ref 98–111)
Creatinine, Ser: 2.27 mg/dL — ABNORMAL HIGH (ref 0.44–1.00)
GFR, Estimated: 20 mL/min — ABNORMAL LOW (ref >60)
Glucose, Bld: 106 mg/dL — ABNORMAL HIGH (ref 70–99)
Potassium: 4.8 mmol/L (ref 3.5–5.1)
Sodium: 143 mmol/L (ref 135–145)
Total Bilirubin: 0.9 mg/dL (ref 0.3–1.2)
Total Protein: 5.7 g/dL — ABNORMAL LOW (ref 6.5–8.1)

## 2022-03-21 LAB — RESP PANEL BY RT-PCR (FLU A&B, COVID) ARPGX2
Influenza A by PCR: NEGATIVE
Influenza B by PCR: NEGATIVE
SARS Coronavirus 2 by RT PCR: NEGATIVE

## 2022-03-21 LAB — PROTIME-INR
INR: 1.4 — ABNORMAL HIGH (ref 0.8–1.2)
Prothrombin Time: 16.9 seconds — ABNORMAL HIGH (ref 11.4–15.2)

## 2022-03-21 LAB — LACTIC ACID, PLASMA: Lactic Acid, Venous: 1.8 mmol/L (ref 0.5–1.9)

## 2022-03-21 LAB — APTT: aPTT: 37 seconds — ABNORMAL HIGH (ref 24–36)

## 2022-03-21 LAB — POC OCCULT BLOOD, ED: Fecal Occult Bld: POSITIVE — AB

## 2022-03-21 MED ORDER — METRONIDAZOLE 500 MG/100ML IV SOLN
500.0000 mg | Freq: Once | INTRAVENOUS | Status: AC
Start: 2022-03-21 — End: 2022-03-21
  Administered 2022-03-21: 500 mg via INTRAVENOUS
  Filled 2022-03-21: qty 100

## 2022-03-21 MED ORDER — LACTATED RINGERS IV BOLUS
1000.0000 mL | Freq: Once | INTRAVENOUS | Status: AC
Start: 2022-03-21 — End: 2022-03-21
  Administered 2022-03-21: 1000 mL via INTRAVENOUS

## 2022-03-21 MED ORDER — LACTATED RINGERS IV BOLUS (SEPSIS)
1000.0000 mL | Freq: Once | INTRAVENOUS | Status: AC
  Administered 2022-03-21: 1000 mL via INTRAVENOUS

## 2022-03-21 MED ORDER — ACETAMINOPHEN 650 MG RE SUPP
650.0000 mg | Freq: Once | RECTAL | Status: AC
  Administered 2022-03-21: 650 mg via RECTAL
  Filled 2022-03-21: qty 1

## 2022-03-21 MED ORDER — VANCOMYCIN HCL IN DEXTROSE 1-5 GM/200ML-% IV SOLN
1000.0000 mg | Freq: Once | INTRAVENOUS | Status: AC
  Administered 2022-03-21: 1000 mg via INTRAVENOUS
  Filled 2022-03-21: qty 200

## 2022-03-21 MED ORDER — LACTATED RINGERS IV SOLN: INTRAVENOUS | Status: AC

## 2022-03-21 MED ORDER — SODIUM CHLORIDE 0.9 % IV SOLN
2.0000 g | Freq: Once | INTRAVENOUS | Status: AC
  Administered 2022-03-21: 2 g via INTRAVENOUS
  Filled 2022-03-21: qty 12.5

## 2022-03-21 MED ORDER — SODIUM CHLORIDE 0.9 % IV SOLN
10.0000 mL/h | Freq: Once | INTRAVENOUS | Status: AC
  Administered 2022-03-22: 10 mL/h via INTRAVENOUS

## 2022-03-21 NOTE — ED Provider Notes (Signed)
[facility] EMERGENCY DEPARTMENT Provider Note   CSN: [id number] Arrival date & time: 03/21/22  2053     History  Chief Complaint  Patient presents with   Fever    Faith Lee is a 88 y.o. female.  Patient with stage IV mantle cell lymphoma, presents from SNF with fever, generalized weakness, concern for sepsis.   Recent chemotherapy. Patient limited historian - level 5 caveat. Pt denies specific area of pain. No headache. No chest pain or sob. No cough or uri symptoms. No abd pain or nvd. No dysuria. No extremity pain.   The history is provided by the patient, medical records and the EMS personnel. The history is limited by the condition of the patient.  Fever Associated symptoms: no chest pain, no chills, no cough, no dysuria, no headaches, no rash, no sore throat and no vomiting       Home Medications Prior to Admission medications   Medication Sig Start Date End Date Taking? Authorizing Provider  allopurinol (ZYLOPRIM) 100 MG tablet Take 200 mg by mouth daily. 03/19/22  Yes [provider]  diphenhydrAMINE (BENADRYL) 25 MG tablet Take 25 mg by mouth every 6 (six) hours as needed.   Yes [provider]  dronabinol (MARINOL) 2.5 MG capsule Take 1 capsule (2.5 mg total) by mouth daily before lunch. 03/18/22  Yes Gerlene Fee, NP  furosemide (LASIX) 20 MG tablet Take 20 mg by mouth daily. 05/02/21  Yes [provider]  LORazepam (ATIVAN) 0.5 MG tablet Take 1 tablet (0.5 mg total) by mouth 2 (two) times daily. 03/05/22  Yes Gerlene Fee, NP  metoprolol tartrate (LOPRESSOR) 25 MG tablet Take 25 mg by mouth 2 (two) times daily. 05/02/21  Yes [provider]  allopurinol (ZYLOPRIM) 300 MG tablet Take 1 tablet (300 mg total) by mouth daily. 01/11/22   Derek Jack, MD  docusate sodium (COLACE) 100 MG capsule Take 1 capsule (100 mg total) by mouth at bedtime. May take 2 capsules if needed. 02/25/22   Harriett Rush, PA-C  Ensure  Max Protein (ENSURE MAX PROTEIN) LIQD due to increased protein/energy needs related to cancer treatments Three Times A Day    [provider]  losartan (COZAAR) 100 MG tablet Take 100 mg by mouth daily.    [provider]  mirtazapine (REMERON) 7.5 MG tablet Take 7.5 mg by mouth at bedtime. 12/14/21   [provider]  oxyCODONE (ROXICODONE) 5 MG immediate release tablet Take 1 tablet (5 mg total) by mouth every 8 (eight) hours as needed. 03/10/22   Pappayliou, Barnetta Chapel A, DO      Allergies    Lidocaine and Rituximab-pvvr    Review of Systems   Review of Systems  Constitutional:  Positive for fever. Negative for chills.  HENT:  Negative for sore throat.   Eyes:  Negative for redness.  Respiratory:  Negative for cough and shortness of breath.   Cardiovascular:  Negative for chest pain.  Gastrointestinal:  Negative for abdominal pain and vomiting.  Genitourinary:  Negative for dysuria and flank pain.  Musculoskeletal:  Negative for neck pain and neck stiffness.  Skin:  Negative for rash.  Neurological:  Negative for headaches.  Hematological:  Does not bruise/bleed easily.  Psychiatric/Behavioral:  Negative for agitation.    Physical Exam Updated Vital Signs BP (!) 109/47 (BP Location: Right Arm)   Pulse (!) 109   Temp (!) 100.6 F (38.1 C) (Oral)   Resp (!) 22   Ht  1.626 m ('5\' 4"'$ )   Wt 64.8 kg   SpO2 100%   BMI 24.52 kg/m  Physical Exam Vitals and nursing note reviewed.  Constitutional:      Appearance: Normal appearance. She is well-developed.  HENT:     Head: Atraumatic.     Nose: Nose normal.     Mouth/Throat:     Mouth: Mucous membranes are moist.  Eyes:     General: No scleral icterus.    Conjunctiva/sclera: Conjunctivae normal.     Pupils: Pupils are equal, round, and reactive to light.  Neck:     Trachea: No tracheal deviation.     Comments: No stiffness or rigidity.  Cardiovascular:     Rate and Rhythm: Regular rhythm. Tachycardia  present.     Pulses: Normal pulses.     Heart sounds: Normal heart sounds. No murmur heard.   No friction rub. No gallop.  Pulmonary:     Effort: Pulmonary effort is normal. No respiratory distress.     Breath sounds: Normal breath sounds.     Comments: Port right chest without sign of infection.  Abdominal:     General: Bowel sounds are normal. There is no distension.     Palpations: Abdomen is soft.     Tenderness: There is no abdominal tenderness. There is no guarding.  Genitourinary:    Comments: No cva tenderness.  Musculoskeletal:        General: No swelling or tenderness.     Cervical back: Normal range of motion and neck supple. No rigidity or tenderness. No muscular tenderness.     Right lower leg: No edema.     Left lower leg: No edema.  Skin:    General: Skin is warm and dry.     Findings: No rash.  Neurological:     Mental Status: She is alert.     Comments: Alert, speech normal. Motor/sens grossly intact bil.   Psychiatric:        Mood and Affect: Mood normal.    ED Results / Procedures / Treatments   Labs (all labs ordered are listed, but only abnormal results are displayed) Results for orders placed or performed during the hospital encounter of 03/21/22  Lactic acid, plasma  Result Value Ref Range   Lactic Acid, Venous 1.8 0.5 - 1.9 mmol/L  Comprehensive metabolic panel  Result Value Ref Range   Sodium 143 135 - 145 mmol/L   Potassium 4.8 3.5 - 5.1 mmol/L   Chloride 112 (H) 98 - 111 mmol/L   CO2 24 22 - 32 mmol/L   Glucose, Bld 106 (H) 70 - 99 mg/dL   BUN 67 (H) 8 - 23 mg/dL   Creatinine, Ser 2.27 (H) 0.44 - 1.00 mg/dL   Calcium 7.9 (L) 8.9 - 10.3 mg/dL   Total Protein 5.7 (L) 6.5 - 8.1 g/dL   Albumin 3.4 (L) 3.5 - 5.0 g/dL   AST 32 15 - 41 U/L   ALT 13 0 - 44 U/L   Alkaline Phosphatase 38 38 - 126 U/L   Total Bilirubin 0.9 0.3 - 1.2 mg/dL   GFR, Estimated 20 (L) >60 mL/min   Anion gap 7 5 - 15  CBC with Differential  Result Value Ref Range   WBC  12.5 (H) 4.0 - 10.5 K/uL   RBC 2.09 (L) 3.87 - 5.11 MIL/uL   Hemoglobin 6.9 (LL) 12.0 - 15.0 g/dL   HCT 23.5 (L) 36.0 - 46.0 %   MCV 112.4 (H) 80.0 -  100.0 fL   MCH 33.0 26.0 - 34.0 pg   MCHC 29.4 (L) 30.0 - 36.0 g/dL   RDW 16.9 (H) 11.5 - 15.5 %   Platelets 105 (L) 150 - 400 K/uL   nRBC 0.3 (H) 0.0 - 0.2 %   Neutrophils Relative % 18 %   Neutro Abs 2.3 1.7 - 7.7 K/uL   Lymphocytes Relative 60 %   Lymphs Abs 7.3 (H) 0.7 - 4.0 K/uL   Monocytes Relative 20 %   Monocytes Absolute 2.5 (H) 0.1 - 1.0 K/uL   Eosinophils Relative 2 %   Eosinophils Absolute 0.3 0.0 - 0.5 K/uL   Basophils Relative 0 %   Basophils Absolute 0.0 0.0 - 0.1 K/uL   WBC Morphology Abnormal lymphocytes present    Immature Granulocytes 0 %   Abs Immature Granulocytes 0.05 0.00 - 0.07 K/uL  Protime-INR  Result Value Ref Range   Prothrombin Time 16.9 (H) 11.4 - 15.2 seconds   INR 1.4 (H) 0.8 - 1.2  APTT  Result Value Ref Range   aPTT 37 (H) 24 - 36 seconds  Urinalysis, Routine w reflex microscopic Urine, In & Out Cath  Result Value Ref Range   Color, Urine AMBER (A) YELLOW   APPearance CLOUDY (A) CLEAR   Specific Gravity, Urine 1.017 1.005 - 1.030   pH 5.0 5.0 - 8.0   Glucose, UA NEGATIVE NEGATIVE mg/dL   Hgb urine dipstick NEGATIVE NEGATIVE   Bilirubin Urine NEGATIVE NEGATIVE   Ketones, ur 5 (A) NEGATIVE mg/dL   Protein, ur 100 (A) NEGATIVE mg/dL   Nitrite NEGATIVE NEGATIVE   Leukocytes,Ua NEGATIVE NEGATIVE   RBC / HPF 6-10 0 - 5 RBC/hpf   WBC, UA 11-20 0 - 5 WBC/hpf   Bacteria, UA RARE (A) NONE SEEN   Squamous Epithelial / LPF 0-5 0 - 5   WBC Clumps PRESENT    Budding Yeast PRESENT   POC occult blood, ED RN will collect  Result Value Ref Range   Fecal Occult Bld POSITIVE (A) NEGATIVE   DG Chest 1 View  Result Date: 03/10/2022 CLINICAL DATA:  Right-sided Port-A-Cath placement EXAM: CHEST  1 VIEW COMPARISON:  03/10/2021 FINDINGS: Cardiomegaly. Right chest port catheter, tip positioned over the  superior vena cava. Both lungs are clear. The visualized skeletal structures are unremarkable. IMPRESSION: 1.  Cardiomegaly without acute abnormality of the lungs. 2. Right chest port catheter, tip positioned over the superior vena cava. Electronically Signed   By: Delanna Ahmadi M.D.   On: 03/10/2022 09:52   DG Chest Port 1 View  Result Date: 03/21/2022 CLINICAL DATA:  Questionable sepsis - evaluate for abnormality Fever.  Lethargy. EXAM: PORTABLE CHEST 1 VIEW COMPARISON:  Radiograph 03/10/2022 FINDINGS: Right chest port remains in place. Persistent low lung volumes. Stable heart size and mediastinal contours. Aortic atherosclerosis. No focal airspace disease, pleural effusion, or pneumothorax. No acute osseous findings. IMPRESSION: Low lung volumes without acute findings. Electronically Signed   By: Keith Rake M.D.   On: 03/21/2022 21:42   DG C-Arm 1-60 Min-No Report  Result Date: 03/10/2022 Fluoroscopy was utilized by the requesting physician.  No radiographic interpretation.     EKG None  Radiology DG Chest Port 1 View  Result Date: 03/21/2022 CLINICAL DATA:  Questionable sepsis - evaluate for abnormality Fever.  Lethargy. EXAM: PORTABLE CHEST 1 VIEW COMPARISON:  Radiograph 03/10/2022 FINDINGS: Right chest port remains in place. Persistent low lung volumes. Stable heart size and mediastinal contours. Aortic atherosclerosis. No focal airspace  disease, pleural effusion, or pneumothorax. No acute osseous findings. IMPRESSION: Low lung volumes without acute findings. Electronically Signed   By: Keith Rake M.D.   On: 03/21/2022 21:42    Procedures Procedures    Medications Ordered in ED Medications  acetaminophen (TYLENOL) suppository 650 mg (has no administration in time range)    ED Course/ Medical Decision Making/ A&P                           Medical Decision Making Amount and/or Complexity of Data Reviewed Labs: ordered. Radiology: ordered. ECG/medicine tests:  ordered.  Risk Prescription drug management. Decision regarding hospitalization.   Iv ns. Continuous pulse ox and cardiac monitoring. Labs ordered/sent. Imaging ordered.   Reviewed nursing notes and prior charts for additional history. External reports reviewed. Additional history from: EMS.   Cardiac monitor: sinus rhythm, rate 108.   Labs reviewed/interpreted by me - hgb lower than prior. Stool heme pos. Aki. Uti.   Xrays reviewed/interpreted by me - no pna.   Ns bolus. Post cultures, iv abx.   LR iv 30 cc/kg bolus.   Prbc transfusion ordered.   Protonix iv.   Recheck chest cta. Recheck abd soft nt.   Ct imaging pending.   Additional ivf bolus.   Discussed w hospitalist - Dr Josephine Cables, he requests call back from oncoming EDP once ct imaging and additional ordered fluids in. Discussed plan with oncoming EDP Dr Stark Jock.   CRITICAL CARE RE: sepsis, uti, low blood pressure, aki, dehydration.  Performed by: Mirna Mires Total critical care time 105 minutes Critical care time was exclusive of separately billable procedures and treating other patients. Critical care was necessary to treat or prevent imminent or life-threatening deterioration. Critical care was time spent personally by me on the following activities: development of treatment plan with patient and/or surrogate as well as nursing, discussions with consultants, evaluation of patient's response to treatment, examination of patient, obtaining history from patient or surrogate, ordering and performing treatments and interventions, ordering and review of laboratory studies, ordering and review of radiographic studies, pulse oximetry and re-evaluation of patient's condition.          Final Clinical Impression(s) / ED Diagnoses Final diagnoses:  None    Rx / DC Orders ED Discharge Orders     None         Lajean Saver, MD 2022-04-15 1109

## 2022-03-21 NOTE — ED Notes (Signed)
Date and time results received: 03/21/22 2241   Test: Hgb Critical Value: 6.9  Name of Provider Notified: Ashok Cordia, MD

## 2022-03-21 NOTE — ED Notes (Signed)
X-ray at bedside

## 2022-03-21 NOTE — Sepsis Progress Note (Signed)
Elink following code sepsis °

## 2022-03-21 NOTE — ED Notes (Signed)
RN called Pts Daughter Harle Battiest (POA) at [phone], Pts Daughter gave this RN and Charge Nurse Vivien Rota verbal consent via telephone to proceed with blood transfusion.

## 2022-03-21 NOTE — ED Notes (Signed)
Pt has a right side implanted port and actively undergoing chemo at this time

## 2022-03-21 NOTE — ED Triage Notes (Signed)
Pt arrives from [facility] c/o fever. Fever is 100.6 F. Pt is lethargic and unable to answer most questions at this time. Pt has a hx of cancer.

## 2022-03-22 ENCOUNTER — Inpatient Hospital Stay (HOSPITAL_COMMUNITY): Payer: Medicare Other

## 2022-03-22 ENCOUNTER — Encounter (HOSPITAL_COMMUNITY): Payer: Self-pay | Admitting: Hematology

## 2022-03-22 ENCOUNTER — Other Ambulatory Visit (HOSPITAL_COMMUNITY): Payer: Medicare Other

## 2022-03-22 ENCOUNTER — Ambulatory Visit (HOSPITAL_COMMUNITY): Payer: Medicare Other | Admitting: Hematology

## 2022-03-22 ENCOUNTER — Emergency Department (HOSPITAL_COMMUNITY): Payer: Medicare Other

## 2022-03-22 ENCOUNTER — Encounter (HOSPITAL_COMMUNITY): Payer: Self-pay | Admitting: Internal Medicine

## 2022-03-22 DIAGNOSIS — N183 Chronic kidney disease, stage 3 unspecified: Secondary | ICD-10-CM | POA: Diagnosis not present

## 2022-03-22 DIAGNOSIS — N184 Chronic kidney disease, stage 4 (severe): Secondary | ICD-10-CM | POA: Diagnosis present

## 2022-03-22 DIAGNOSIS — N2889 Other specified disorders of kidney and ureter: Secondary | ICD-10-CM | POA: Diagnosis present

## 2022-03-22 DIAGNOSIS — K2950 Unspecified chronic gastritis without bleeding: Secondary | ICD-10-CM | POA: Diagnosis not present

## 2022-03-22 DIAGNOSIS — I10 Essential (primary) hypertension: Secondary | ICD-10-CM | POA: Diagnosis not present

## 2022-03-22 DIAGNOSIS — D649 Anemia, unspecified: Secondary | ICD-10-CM | POA: Diagnosis not present

## 2022-03-22 DIAGNOSIS — D509 Iron deficiency anemia, unspecified: Secondary | ICD-10-CM | POA: Diagnosis not present

## 2022-03-22 DIAGNOSIS — D696 Thrombocytopenia, unspecified: Secondary | ICD-10-CM | POA: Diagnosis present

## 2022-03-22 DIAGNOSIS — N390 Urinary tract infection, site not specified: Secondary | ICD-10-CM | POA: Diagnosis present

## 2022-03-22 DIAGNOSIS — K922 Gastrointestinal hemorrhage, unspecified: Secondary | ICD-10-CM

## 2022-03-22 DIAGNOSIS — E872 Acidosis, unspecified: Secondary | ICD-10-CM | POA: Diagnosis present

## 2022-03-22 DIAGNOSIS — F411 Generalized anxiety disorder: Secondary | ICD-10-CM | POA: Diagnosis present

## 2022-03-22 DIAGNOSIS — R195 Other fecal abnormalities: Secondary | ICD-10-CM | POA: Diagnosis not present

## 2022-03-22 DIAGNOSIS — N189 Chronic kidney disease, unspecified: Secondary | ICD-10-CM | POA: Diagnosis not present

## 2022-03-22 DIAGNOSIS — R197 Diarrhea, unspecified: Secondary | ICD-10-CM | POA: Diagnosis not present

## 2022-03-22 DIAGNOSIS — I1310 Hypertensive heart and chronic kidney disease without heart failure, with stage 1 through stage 4 chronic kidney disease, or unspecified chronic kidney disease: Secondary | ICD-10-CM | POA: Diagnosis present

## 2022-03-22 DIAGNOSIS — K648 Other hemorrhoids: Secondary | ICD-10-CM | POA: Diagnosis present

## 2022-03-22 DIAGNOSIS — N170 Acute kidney failure with tubular necrosis: Secondary | ICD-10-CM | POA: Diagnosis not present

## 2022-03-22 DIAGNOSIS — N3289 Other specified disorders of bladder: Secondary | ICD-10-CM | POA: Diagnosis not present

## 2022-03-22 DIAGNOSIS — R634 Abnormal weight loss: Secondary | ICD-10-CM | POA: Diagnosis present

## 2022-03-22 DIAGNOSIS — R6510 Systemic inflammatory response syndrome (SIRS) of non-infectious origin without acute organ dysfunction: Secondary | ICD-10-CM

## 2022-03-22 DIAGNOSIS — N250 Renal osteodystrophy: Secondary | ICD-10-CM | POA: Diagnosis not present

## 2022-03-22 DIAGNOSIS — B3781 Candidal esophagitis: Secondary | ICD-10-CM | POA: Diagnosis present

## 2022-03-22 DIAGNOSIS — I129 Hypertensive chronic kidney disease with stage 1 through stage 4 chronic kidney disease, or unspecified chronic kidney disease: Secondary | ICD-10-CM | POA: Diagnosis not present

## 2022-03-22 DIAGNOSIS — K2970 Gastritis, unspecified, without bleeding: Secondary | ICD-10-CM | POA: Diagnosis not present

## 2022-03-22 DIAGNOSIS — K5731 Diverticulosis of large intestine without perforation or abscess with bleeding: Secondary | ICD-10-CM | POA: Diagnosis present

## 2022-03-22 DIAGNOSIS — A419 Sepsis, unspecified organism: Secondary | ICD-10-CM | POA: Diagnosis present

## 2022-03-22 DIAGNOSIS — K2951 Unspecified chronic gastritis with bleeding: Secondary | ICD-10-CM | POA: Diagnosis present

## 2022-03-22 DIAGNOSIS — C8319 Mantle cell lymphoma, extranodal and solid organ sites: Secondary | ICD-10-CM | POA: Diagnosis not present

## 2022-03-22 DIAGNOSIS — K8020 Calculus of gallbladder without cholecystitis without obstruction: Secondary | ICD-10-CM | POA: Diagnosis present

## 2022-03-22 DIAGNOSIS — K5730 Diverticulosis of large intestine without perforation or abscess without bleeding: Secondary | ICD-10-CM | POA: Diagnosis not present

## 2022-03-22 DIAGNOSIS — C8310 Mantle cell lymphoma, unspecified site: Secondary | ICD-10-CM | POA: Diagnosis present

## 2022-03-22 DIAGNOSIS — N1832 Chronic kidney disease, stage 3b: Secondary | ICD-10-CM | POA: Diagnosis not present

## 2022-03-22 DIAGNOSIS — K3189 Other diseases of stomach and duodenum: Secondary | ICD-10-CM | POA: Diagnosis not present

## 2022-03-22 DIAGNOSIS — M6281 Muscle weakness (generalized): Secondary | ICD-10-CM | POA: Diagnosis not present

## 2022-03-22 DIAGNOSIS — L89152 Pressure ulcer of sacral region, stage 2: Secondary | ICD-10-CM | POA: Diagnosis present

## 2022-03-22 DIAGNOSIS — D62 Acute posthemorrhagic anemia: Secondary | ICD-10-CM

## 2022-03-22 DIAGNOSIS — R531 Weakness: Secondary | ICD-10-CM | POA: Diagnosis not present

## 2022-03-22 DIAGNOSIS — R29818 Other symptoms and signs involving the nervous system: Secondary | ICD-10-CM | POA: Diagnosis not present

## 2022-03-22 DIAGNOSIS — N179 Acute kidney failure, unspecified: Secondary | ICD-10-CM | POA: Diagnosis not present

## 2022-03-22 DIAGNOSIS — G928 Other toxic encephalopathy: Secondary | ICD-10-CM | POA: Diagnosis present

## 2022-03-22 DIAGNOSIS — J90 Pleural effusion, not elsewhere classified: Secondary | ICD-10-CM | POA: Diagnosis present

## 2022-03-22 DIAGNOSIS — Z515 Encounter for palliative care: Secondary | ICD-10-CM | POA: Diagnosis not present

## 2022-03-22 DIAGNOSIS — E860 Dehydration: Secondary | ICD-10-CM | POA: Diagnosis present

## 2022-03-22 DIAGNOSIS — Z66 Do not resuscitate: Secondary | ICD-10-CM | POA: Diagnosis not present

## 2022-03-22 DIAGNOSIS — Z20822 Contact with and (suspected) exposure to covid-19: Secondary | ICD-10-CM | POA: Diagnosis present

## 2022-03-22 LAB — PHOSPHORUS: Phosphorus: 2.7 mg/dL (ref 2.5–4.6)

## 2022-03-22 LAB — COMPREHENSIVE METABOLIC PANEL
ALT: 11 U/L (ref 0–44)
AST: 39 U/L (ref 15–41)
Albumin: 3.1 g/dL — ABNORMAL LOW (ref 3.5–5.0)
Alkaline Phosphatase: 35 U/L — ABNORMAL LOW (ref 38–126)
Anion gap: 5 (ref 5–15)
BUN: 61 mg/dL — ABNORMAL HIGH (ref 8–23)
CO2: 23 mmol/L (ref 22–32)
Calcium: 7.5 mg/dL — ABNORMAL LOW (ref 8.9–10.3)
Chloride: 114 mmol/L — ABNORMAL HIGH (ref 98–111)
Creatinine, Ser: 2.06 mg/dL — ABNORMAL HIGH (ref 0.44–1.00)
GFR, Estimated: 23 mL/min — ABNORMAL LOW (ref >60)
Glucose, Bld: 96 mg/dL (ref 70–99)
Potassium: 4.7 mmol/L (ref 3.5–5.1)
Sodium: 142 mmol/L (ref 135–145)
Total Bilirubin: 2 mg/dL — ABNORMAL HIGH (ref 0.3–1.2)
Total Protein: 5.1 g/dL — ABNORMAL LOW (ref 6.5–8.1)

## 2022-03-22 LAB — BILIRUBIN, FRACTIONATED(TOT/DIR/INDIR)
Bilirubin, Direct: 0.7 mg/dL — ABNORMAL HIGH (ref 0.0–0.2)
Indirect Bilirubin: 1.1 mg/dL — ABNORMAL HIGH (ref 0.3–0.9)
Total Bilirubin: 1.8 mg/dL — ABNORMAL HIGH (ref 0.3–1.2)

## 2022-03-22 LAB — CBC
HCT: 32.1 % — ABNORMAL LOW (ref 36.0–46.0)
Hemoglobin: 10 g/dL — ABNORMAL LOW (ref 12.0–15.0)
MCH: 32.1 pg (ref 26.0–34.0)
MCHC: 31.2 g/dL (ref 30.0–36.0)
MCV: 102.9 fL — ABNORMAL HIGH (ref 80.0–100.0)
Platelets: 91 10*3/uL — ABNORMAL LOW (ref 150–400)
RBC: 3.12 MIL/uL — ABNORMAL LOW (ref 3.87–5.11)
RDW: 22 % — ABNORMAL HIGH (ref 11.5–15.5)
WBC: 14.3 10*3/uL — ABNORMAL HIGH (ref 4.0–10.5)
nRBC: 0.3 % — ABNORMAL HIGH (ref 0.0–0.2)

## 2022-03-22 LAB — PREPARE RBC (CROSSMATCH)

## 2022-03-22 LAB — AMMONIA: Ammonia: 23 umol/L (ref 9–35)

## 2022-03-22 LAB — MAGNESIUM: Magnesium: 2.5 mg/dL — ABNORMAL HIGH (ref 1.7–2.4)

## 2022-03-22 MED ORDER — VANCOMYCIN HCL 750 MG/150ML IV SOLN
750.0000 mg | INTRAVENOUS | Status: DC
  Administered 2022-03-23: 750 mg via INTRAVENOUS
  Filled 2022-03-22: qty 150

## 2022-03-22 MED ORDER — ACETAMINOPHEN 650 MG RE SUPP: 650.0000 mg | Freq: Four times a day (QID) | RECTAL | Status: DC | PRN

## 2022-03-22 MED ORDER — ONDANSETRON HCL 4 MG/2ML IJ SOLN: 4.0000 mg | Freq: Four times a day (QID) | INTRAMUSCULAR | Status: DC | PRN

## 2022-03-22 MED ORDER — PANTOPRAZOLE 80MG IVPB - SIMPLE MED
80.0000 mg | Freq: Once | INTRAVENOUS | Status: AC
  Administered 2022-03-22: 80 mg via INTRAVENOUS
  Filled 2022-03-22: qty 100

## 2022-03-22 MED ORDER — PANTOPRAZOLE SODIUM 40 MG IV SOLR: 40.0000 mg | Freq: Two times a day (BID) | INTRAVENOUS | Status: DC

## 2022-03-22 MED ORDER — ACETAMINOPHEN 325 MG PO TABS
650.0000 mg | ORAL_TABLET | Freq: Four times a day (QID) | ORAL | Status: DC | PRN
  Filled 2022-03-22: qty 2

## 2022-03-22 MED ORDER — ALLOPURINOL 100 MG PO TABS
300.0000 mg | ORAL_TABLET | Freq: Every day | ORAL | Status: DC
  Administered 2022-03-23: 300 mg via ORAL
  Filled 2022-03-22 (×2): qty 3

## 2022-03-22 MED ORDER — ONDANSETRON HCL 4 MG PO TABS: 4.0000 mg | ORAL_TABLET | Freq: Four times a day (QID) | ORAL | Status: DC | PRN

## 2022-03-22 MED ORDER — SODIUM CHLORIDE 0.9 % IV SOLN
2.0000 g | INTRAVENOUS | Status: DC
  Administered 2022-03-22 – 2022-03-24 (×3): 2 g via INTRAVENOUS
  Filled 2022-03-22 (×3): qty 12.5

## 2022-03-22 MED ORDER — PANTOPRAZOLE INFUSION (NEW) - SIMPLE MED
8.0000 mg/h | INTRAVENOUS | Status: DC
  Administered 2022-03-22 – 2022-03-23 (×3): 8 mg/h via INTRAVENOUS
  Filled 2022-03-22 (×3): qty 100
  Filled 2022-03-22 (×2): qty 80
  Filled 2022-03-22: qty 100

## 2022-03-22 NOTE — Consult Note (Signed)
Gastroenterology Consult   Referring Provider: Dr. Thomes Dinning  Primary Care Physician:  Sharee Holster, NP Primary Gastroenterologist:  Dr. Marletta Lor, previously unassigned  Patient ID: [name]; [id number]; 05-01-1931   Admit date: 03/21/2022  LOS: 0 days   Date of Consultation: 03/22/2022  Reason for Consultation:  GI bleed  History of Present Illness   Faith Lee is an 88 y.o. year old female diagnosed with stage IV Mantel cell lymphoma, receiving first cycle of chemo on 02/18/22. Second cycle was due on 6/1, but this was held per Oncology. Normocytic anemia felt to be related to bone marrow infiltration by lymphoma. Presented yesterday to the ED with generalized weakness and fever, with Hgb 6.9. Heme positive. Previous Hgb 7.9 about 4 days ago.   Presented from the [facility] yesterday with fever and generalized weakness. Sepsis protocol initiated. Hgb 6.9, down from 7.9 several days prior. Heme positive. Received 2 units PRBCs, with improvement in Hgb to 10 this morning. CT chest/abd/pelvis without contrast with diminished left pleural effusion compared to prior PET, massive splenomegaly noted, cholelithiasis, diverticulosis, and generalized body wall edema. LFTs normal except for elevated Tbili at 2.0. Blood cultures no growth thus far. UA with rare bacteria, culture in process.   Patient does not have family at bedside. Difficult historian. She does not believe she has seen any overt GI bleeding. Denies constipation, diarrhea, abdominal pain, dysphagia. I do note she has had poor appetite, per notes by Oncology. She has also lost weight unintentionally. She believes she had a colonoscopy many years ago but unable to tell me any further information regarding this. Unknown family history of colorectal cancer or polyps.         Past Medical History:  Diagnosis Date   Anxiety    Cancer (HCC)    GAD (generalized anxiety disorder)    Hypertension    IBS (irritable  bowel syndrome)    Irritable bowel disease    Osteoarthritis    Renal disorder     Past Surgical History:  Procedure Laterality Date   ABDOMINAL HYSTERECTOMY  1974   CARDIAC CATHETERIZATION     1991 and 2005   PORTACATH PLACEMENT Right 03/10/2022   Procedure: INSERTION PORT-A-CATH, RIGHT IJ;  Surgeon: Lewie Chamber, DO;  Location: [facility];  Service: General;  Laterality: Right;    Prior to Admission medications   Medication Sig Start Date End Date Taking? Authorizing Provider  allopurinol (ZYLOPRIM) 300 MG tablet Take 1 tablet (300 mg total) by mouth daily. Patient taking differently: Take 200 mg by mouth daily. 01/11/22  Yes Doreatha Massed, MD  diphenhydrAMINE (BENADRYL) 25 MG tablet Take 25 mg by mouth every 6 (six) hours as needed for allergies or itching.   Yes [provider]  dronabinol (MARINOL) 2.5 MG capsule Take 1 capsule (2.5 mg total) by mouth daily before lunch. 03/18/22  Yes Sharee Holster, NP  Ensure Max Protein (ENSURE MAX PROTEIN) LIQD due to increased protein/energy needs related to cancer treatments Three Times A Day   Yes [provider]  furosemide (LASIX) 20 MG tablet Take 20 mg by mouth daily. 05/02/21  Yes [provider]  LORazepam (ATIVAN) 0.5 MG tablet Take 1 tablet (0.5 mg total) by mouth 2 (two) times daily. 03/05/22  Yes Sharee Holster, NP  metoprolol tartrate (LOPRESSOR) 25 MG tablet Take 25 mg by mouth 2 (two) times daily. 05/02/21  Yes [provider]  mirtazapine (REMERON) 7.5 MG tablet Take 7.5 mg  by mouth at bedtime. 12/14/21  Yes [provider]  sennosides-docusate sodium (SENOKOT-S) 8.6-50 MG tablet Take 1 tablet by mouth in the morning and at bedtime.   Yes [provider]  docusate sodium (COLACE) 100 MG capsule Take 1 capsule (100 mg total) by mouth at bedtime. May take 2 capsules if needed. Patient not taking: Reported on 03/22/2022 02/25/22   Carnella Guadalajara, PA-C  oxyCODONE  (ROXICODONE) 5 MG immediate release tablet Take 1 tablet (5 mg total) by mouth every 8 (eight) hours as needed. Patient not taking: Reported on 03/22/2022 03/10/22   Pappayliou, Gustavus Messing, DO    Current Facility-Administered Medications  Medication Dose Route Frequency Provider Last Rate Last Admin   acetaminophen (TYLENOL) tablet 650 mg  650 mg Oral Q6H PRN Adefeso, Oladapo, DO       Or   acetaminophen (TYLENOL) suppository 650 mg  650 mg Rectal Q6H PRN Adefeso, Oladapo, DO       ceFEPIme (MAXIPIME) 2 g in sodium chloride 0.9 % 100 mL IVPB  2 g Intravenous Q24H Adefeso, Oladapo, DO       lactated ringers infusion   Intravenous Continuous Adefeso, Oladapo, DO 150 mL/hr at 03/22/22 0545 New Bag at 03/22/22 0545   ondansetron (ZOFRAN) tablet 4 mg  4 mg Oral Q6H PRN Adefeso, Oladapo, DO       Or   ondansetron (ZOFRAN) injection 4 mg  4 mg Intravenous Q6H PRN Adefeso, Oladapo, DO       [START ON 03/25/2022] pantoprazole (PROTONIX) injection 40 mg  40 mg Intravenous Q12H Adefeso, Oladapo, DO       pantoprozole (PROTONIX) 80 mg /NS 100 mL infusion  8 mg/hr Intravenous Continuous Adefeso, Oladapo, DO 10 mL/hr at 03/22/22 0544 8 mg/hr at 03/22/22 0544   [START ON 03/23/2022] vancomycin (VANCOREADY) IVPB 750 mg/150 mL  750 mg Intravenous Q48H Adefeso, Oladapo, DO        Allergies as of 03/21/2022 - Review Complete 03/21/2022  Allergen Reaction Noted   Lidocaine Other (See Comments) and Hypertension 01/05/2012   Rituximab-pvvr Other (See Comments) 02/18/2022    Family History  Problem Relation Age of Onset   Renal Disease Sister    Multiple myeloma Sister     Social History   Socioeconomic History   Marital status: Widowed    Spouse name: Not on file   Number of children: Not on file   Years of education: Not on file   Highest education level: Not on file  Occupational History   Not on file  Tobacco Use   Smoking status: Never   Smokeless tobacco: Never  Vaping Use   Vaping Use: Never  used  Substance and Sexual Activity   Alcohol use: No   Drug use: No   Sexual activity: Not on file  Other Topics Concern   Not on file  Social History Narrative   Not on file   Social Determinants of Health   Financial Resource Strain: Low Risk    Difficulty of Paying Living Expenses: Not very hard  Food Insecurity: No Food Insecurity   Worried About Running Out of Food in the Last Year: Never true   Ran Out of Food in the Last Year: Never true  Transportation Needs: No Transportation Needs   Lack of Transportation (Medical): No   Lack of Transportation (Non-Medical): No  Physical Activity: Insufficiently Active   Days of Exercise per Week: 2 days   Minutes of Exercise per Session: 10 min  Stress: Stress Concern Present   Feeling of Stress : To some extent  Social Connections: Moderately Integrated   Frequency of Communication with Friends and Family: More than three times a week   Frequency of Social Gatherings with Friends and Family: Twice a week   Attends Religious Services: More than 4 times per year   Active Member of [employer] or Organizations: Yes   Attends Archivist Meetings: 1 to 4 times per year   Marital Status: Widowed  Human resources officer Violence: Not At Risk   Fear of Current or Ex-Partner: No   Emotionally Abused: No   Physically Abused: No   Sexually Abused: No     Review of Systems   Limited as poor historian.   Physical Exam   Vital Signs in last 24 hours: Temp:  [98.4 F (36.9 C)-102.2 F (39 C)] 100.2 F (37.9 C) (06/05 0808) Pulse Rate:  [96-113] 99 (06/05 0836) Resp:  [19-42] 19 (06/05 0800) BP: (87-124)/(39-57) 108/52 (06/05 0836) SpO2:  [91 %-100 %] 100 % (06/05 0836) Weight:  [64.8 kg] 64.8 kg (06/04 2102)    General:   Alert,  chronically ill-appearing. Difficult historian.  Head:  Normocephalic and atraumatic. Eyes:  Sclera clear, no icterus.    Ears:  Normal auditory acuity. Lungs:  Clear throughout to auscultation.     Heart:  S1 S2 present Abdomen:  Soft, nontender and nondistended. Marked splenomegaly on exam.  Rectal: deferred   Msk:  Symmetrical without gross deformities. Normal posture. Extremities:  With pedal edema, 1+ lower extremity edema Neurologic:  Alert and  oriented to person, year.  Skin:  Intact without significant lesions or rashes. Psych:  Alert and cooperative. Normal mood and affect.  Intake/Output from previous day: 06/04 0701 - 06/05 0700 In: 3050 [I.V.:20; Blood:630; IV Piggyback:2400] Out: -  Intake/Output this shift: No intake/output data recorded.    Labs/Studies   Recent Labs Recent Labs    03/21/22 2206 03/22/22 0716  WBC 12.5* 14.3*  HGB 6.9* 10.0*  HCT 23.5* 32.1*  PLT 105* 91*   BMET Recent Labs    03/21/22 2206 03/22/22 0716  NA 143 142  K 4.8 4.7  CL 112* 114*  CO2 24 23  GLUCOSE 106* 96  BUN 67* 61*  CREATININE 2.27* 2.06*  CALCIUM 7.9* 7.5*   LFT Recent Labs    03/21/22 2206 03/22/22 0716  PROT 5.7* 5.1*  ALBUMIN 3.4* 3.1*  AST 32 39  ALT 13 11  ALKPHOS 38 35*  BILITOT 0.9 2.0*   PT/INR Recent Labs    03/21/22 2206  LABPROT 16.9*  INR 1.4*     Radiology/Studies DG Chest Port 1 View  Result Date: 03/21/2022 CLINICAL DATA:  Questionable sepsis - evaluate for abnormality Fever.  Lethargy. EXAM: PORTABLE CHEST 1 VIEW COMPARISON:  Radiograph 03/10/2022 FINDINGS: Right chest port remains in place. Persistent low lung volumes. Stable heart size and mediastinal contours. Aortic atherosclerosis. No focal airspace disease, pleural effusion, or pneumothorax. No acute osseous findings. IMPRESSION: Low lung volumes without acute findings. Electronically Signed   By: Keith Rake M.D.   On: 03/21/2022 21:42   CT CHEST ABDOMEN PELVIS WO CONTRAST  Result Date: 03/22/2022 CLINICAL DATA:  88 year old with fever. Sepsis. History of cancer, radiologic records indicates lymphocytosis. EXAM: CT CHEST, ABDOMEN AND PELVIS WITHOUT CONTRAST  TECHNIQUE: Multidetector CT imaging of the chest, abdomen and pelvis was performed following the standard protocol without IV contrast. RADIATION DOSE REDUCTION: This exam was performed according to  the departmental dose-optimization program which includes automated exposure control, adjustment of the mA and/or kV according to patient size and/or use of iterative reconstruction technique. COMPARISON:  Chest radiograph yesterday. Noncontrast abdominal CT 01/29/2022, PET CT 01/21/2022 FINDINGS: CT CHEST FINDINGS Cardiovascular: Right chest port with tip in the SVC. Aortic atherosclerosis without aneurysm. The heart is upper normal in size. No pericardial effusion. Decreased density of the blood pool consistent with anemia. Mediastinum/Nodes: 14 mm right supraclavicular node. Anterior paratracheal nodal conglomerate measures 18 mm AP dimension. There are prominent bilateral axillary nodes. Limited assessment for hilar adenopathy on this unenhanced exam. No esophageal wall thickening. Lungs/Pleura: Diminished left pleural effusion from prior PET. No acute airspace disease. No pulmonary nodule. No endobronchial lesion. Musculoskeletal: There are no acute or suspicious osseous abnormalities. CT ABDOMEN PELVIS FINDINGS Hepatobiliary: There is no evidence of focal hepatic lesion on this unenhanced exam. Question of periportal edema. Multiple gallstones without abnormal gallbladder distention or evidence of inflammation. Common bile duct is not well-defined in the absence of IV contrast. Pancreas: Not well assessed on the current exam. There is no evidence of pancreatic inflammation. More detailed assessment is limited. Spleen: No splenomegaly with spleen spanning 22 cm cranial caudal. No focal splenic abnormality on this unenhanced exam. Adrenals/Urinary Tract: The adrenal glands are poorly defined. Left kidney is displaced anterior inferiorly by splenomegaly. Probable cyst in the lower right kidney. There is no  hydronephrosis. The urinary bladder is partially distended. Stomach/Bowel: Bowel evaluation is limited in the absence of enteric contrast and patient motion artifact. There is no evidence of bowel obstruction or inflammation. Mild colonic diverticulosis without focal diverticulitis. The appendix is normal. Vascular/Lymphatic: Aortic atherosclerosis. Patient's known retroperitoneal and hepatic duodenal adenopathy is difficult to accurately delineate on this unenhanced exam. There is a 15 mm left periaortic node in the upper abdomen. Reproductive: The uterus is not seen, presumably surgically absent. Other: Generalized body wall edema. There is no ascites or free air. Small fat containing umbilical hernia. Musculoskeletal: There are no acute or suspicious osseous abnormalities. IMPRESSION: 1. Diminished left pleural effusion from prior PET. No evidence of pneumonia. 2. Thoracic adenopathy is similar to prior PET. Patient's known retroperitoneal and hepatic duodenal adenopathy is difficult to accurately delineate on the current exam. Again seen massive splenomegaly. 3. Cholelithiasis without evidence of acute cholecystitis. There may be periportal edema, which is not well assessed on this unenhanced exam. Common bile duct is not delineated. 4. Colonic diverticulosis without diverticulitis. 5. Generalized body wall edema. Aortic Atherosclerosis (ICD10-I70.0). Electronically Signed   By: Keith Rake M.D.   On: 03/22/2022 01:08     Assessment   Faith Lee is an 88 y.o. 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 Second cycle was due on 6/1, but this was held per Oncology. Presented yesterday to the ED from the [facility] with generalized weakness, fever, and concern for sepsis of unknown origin. GI consulted due to acute on chronic anemia and heme positive stool.  Acute on chronic anemia: she has history of normocytic anemia  felt to be secondary to bone marrow infiltration by lymphoma. Now with heme positive stool and worsening anemia but no overt GI bleeding that I am aware. Family not at bedside. I have attempted to contact daughter but had to leave message. Last colonoscopy many years ago per patient. When clinically stable, recommend colonoscopy/EGD if able to tolerate. She has responded well to 2 units  PRBCs, improving Hgb from 6.9 to 10 this morning.   SIRS: empirically started on antibiotics. Urine culture and blood cultures pending.   Elevated bilirubin: 2.0 this morning. Non-specific. No other abnormal LFTs. Will fractionate bilirubin. If jump in LFTs, needs RUQ [place].     Plan / Recommendations    Continue supportive measures Follow H/H Fractionate bilirubin Monitor for overt GI bleeding On Protonix infusion: could change to IV BID at this point.  Recommend colonoscopy if able to tolerate prep and EGD when clinically stabilized Will continue to follow with you     03/22/2022, 12:00 PM  Annitta Needs, PhD, [facility] Gastroenterology

## 2022-03-22 NOTE — Progress Notes (Addendum)
PROGRESS NOTE    [name]  CHY:[id number] DOB: 1931/01/27 DOA: 03/21/2022 PCP: Faith Fee, NP   Brief Narrative:  HPI: Faith Lee is a 88 y.o. female with medical history significant of stage IV mantle cell lymphoma who presents to the emergency department via EMS with generalized weakness and fever.  At bedside, patient was somnolent, though arousable, but was unable to provide any history, history was obtained from ED physician and ED medical record.  Per report, patient had recent chemotherapy, she denies chest pain, shortness of breath, cough or headache or abdominal pain or nausea, vomiting or diarrhea.   ED Course:  In the emergency department, patient was tachycardic, tachypneic, febrile with a temperature of 102.29F, BP was 111/44.  Work-up in the ED showed leukocytosis, H/H6.9/23.5 (this was 7.9/24.7 on 03/18/2022), platelets 105.  BUN/creatinine 67/2.27 (baseline creatinine at 1.2-1.5).  Fecal occult blood was positive.  Blood culture was pending. CT chest, abdomen and pelvis without contrast showed Diminished left pleural effusion from prior PET. No evidence of pneumonia. Patient was treated with Tylenol, IV vancomycin and cefepime, Flagyl was given, IV hydration was provided. Hospitalist was asked to admit patient for further evaluation and management.  Assessment & Plan:   Principal Problem:   SIRS (systemic inflammatory response syndrome) (HCC) Active Problems:   Mantle cell lymphoma (HCC)   GI bleed   Acute kidney injury superimposed on chronic kidney disease (HCC)   Thrombocytopenia (HCC)   Symptomatic anemia   Acute blood loss anemia   Essential hypertension  SIRS/?  UTI: Upon presentation, patient was febrile, tachycardic and tachypneic with leukocytosis.  Source of infection is not clear yet but appears to be likely UTI.  Chest x-ray negative.  CT chest abdomen pelvis is negative for acute pathology.  UA is cloudy with rare bacteria.  Talked to her  daughter who told me that patient had seen urologist sometime in the beginning or middle of May and was diagnosed with UTI.  She was given full course of antibiotics.  She was then diagnosed with UTI once again at her nursing home and was given antibiotic but she broke out in rash so antibiotics were discontinued after 1-2 doses.  At this point in time, we will continue all empiric antibiotics which include cefepime, vancomycin and will follow blood and urine culture.  Acute on chronic blood loss anemia/GI bleed, likely upper: Patient's baseline hemoglobin appears to be around 7.7, patient was found to have 6.9, she received 2 units of PRBC transfusion, 4/3 mammaglobin 10.0.  FOBT was positive.  I have consulted GI.  Monitor closely.  Continue Protonix.  Acute toxic encephalopathy: Patient very lethargic, unable to arouse, moaning when examined the belly.  Despite of being very lethargic, her breathing is normal and not hypoxic.  Blood pressure is also fairly stable. Addendum: Patient continues to remain lethargic.  We will proceed with CT head stat.  Stage IVb mantle cell lymphoma: Receiving chemotherapy Cycle 1 of Bendamustine and rituximab on 02/18/2022.  Bendamustine was dose reduced to 50 mg per metered square.  Second cycle was planned on 03/18/2022 however she was very weak with diarrhea so this was postponed.  AKI on CKD 3A: Patient's baseline creatinine around 1.2-1.5 with GFR around 38, presented with creatinine of 2.27, somewhat improved.  We will continue IV fluids.  This is likely secondary to dehydration.  Avoid nephrotoxic agents.  Essential hypertension: Blood pressure on the low normal side.  Hold metoprolol.  TLS prophylaxis: Resume allopurinol.  Bilateral lower extremity edema: Hold Lasix due to AKI.  DVT prophylaxis: SCDs Start: 03/22/22 0323, awaiting heparin products due to possible GI bleed.   Code Status: Full Code  Family Communication:  None present at bedside.  Plan of care  discussed with patient's daughter over the phone.  Status is: Inpatient Remains inpatient appropriate because: Patient very sick   Estimated body mass index is 24.52 kg/m as calculated from the following:   Height as of this encounter: '5\' 4"'$  (1.626 m).   Weight as of this encounter: 64.8 kg.    Nutritional Assessment: Body mass index is 24.52 kg/m.Marland Kitchen Seen by dietician.  I agree with the assessment and plan as outlined below: Nutrition Status:        . Skin Assessment: I have examined the patient's skin and I agree with the wound assessment as performed by the wound care RN as outlined below:    Consultants:  GI  Procedures:  None  Antimicrobials:  Anti-infectives (From admission, onward)    Start     Dose/Rate Route Frequency Ordered Stop   03/23/22 2200  vancomycin (VANCOREADY) IVPB 750 mg/150 mL        750 mg 150 mL/hr over 60 Minutes Intravenous Every 48 hours 03/22/22 0024     03/22/22 2200  ceFEPIme (MAXIPIME) 2 g in sodium chloride 0.9 % 100 mL IVPB        2 g 200 mL/hr over 30 Minutes Intravenous Every 24 hours 03/22/22 0013     03/21/22 2200  ceFEPIme (MAXIPIME) 2 g in sodium chloride 0.9 % 100 mL IVPB        2 g 200 mL/hr over 30 Minutes Intravenous  Once 03/21/22 2159 03/21/22 2256   03/21/22 2200  metroNIDAZOLE (FLAGYL) IVPB 500 mg        500 mg 100 mL/hr over 60 Minutes Intravenous  Once 03/21/22 2159 03/21/22 2328   03/21/22 2200  vancomycin (VANCOCIN) IVPB 1000 mg/200 mL premix        1,000 mg 200 mL/hr over 60 Minutes Intravenous  Once 03/21/22 2159 03/21/22 2328         Subjective: Patient seen and examined.  Patient very lethargic but appears to be stable.  Objective: Vitals:   03/22/22 0730 03/22/22 0800 03/22/22 0808 03/22/22 0836  BP: (!) 102/48 (!) 124/48  (!) 108/52  Pulse: 96   99  Resp: (!) 39 19    Temp:   100.2 F (37.9 C)   TempSrc:      SpO2: 100%   100%  Weight:      Height:    '5\' 4"'$  (1.626 m)    Intake/Output  Summary (Last 24 hours) at 03/22/2022 1203 Last data filed at 03/22/2022 0305 Gross per 24 hour  Intake 3050 ml  Output --  Net 3050 ml   Filed Weights   03/21/22 2102  Weight: 64.8 kg    Examination:  General exam: Appears lethargic but is stable. Respiratory system: Clear to auscultation. Respiratory effort normal. Cardiovascular system: S1 & S2 heard, RRR. No JVD, murmurs, rubs, gallops or clicks. No pedal edema. Gastrointestinal system: Abdomen is nondistended, soft and nontender. No organomegaly or masses felt. Normal bowel sounds heard. Central nervous system: Lethargic, not following commands. Extremities: Symmetric 5 x 5 power.   Data Reviewed: I have personally reviewed following labs and imaging studies  CBC: Recent Labs  Lab 03/18/22 0759 03/21/22 2206 03/22/22 0716  WBC 22.4* 12.5* 14.3*  NEUTROABS 3.1 2.3  --  HGB 7.9* 6.9* 10.0*  HCT 24.7* 23.5* 32.1*  MCV 104.7* 112.4* 102.9*  PLT 88* 105* 91*   Basic Metabolic Panel: Recent Labs  Lab 03/18/22 0759 03/21/22 2206 03/22/22 0716  NA 137 143 142  K 4.5 4.8 4.7  CL 105 112* 114*  CO2 '26 24 23  '$ GLUCOSE 105* 106* 96  BUN 77* 67* 61*  CREATININE 2.30* 2.27* 2.06*  CALCIUM 7.9* 7.9* 7.5*  MG 2.8*  --  2.5*  PHOS 3.5  --  2.7   GFR: Estimated Creatinine Clearance: 16.3 mL/min (A) (by C-G formula based on SCr of 2.06 mg/dL (H)). Liver Function Tests: Recent Labs  Lab 03/18/22 0759 03/21/22 2206 03/22/22 0716  AST 27 32 39  ALT '11 13 11  '$ ALKPHOS 46 38 35*  BILITOT 0.7 0.9 2.0*  PROT 5.3* 5.7* 5.1*  ALBUMIN 3.3* 3.4* 3.1*   No results for input(s): LIPASE, AMYLASE in the last 168 hours. No results for input(s): AMMONIA in the last 168 hours. Coagulation Profile: Recent Labs  Lab 03/21/22 2206  INR 1.4*   Cardiac Enzymes: No results for input(s): CKTOTAL, CKMB, CKMBINDEX, TROPONINI in the last 168 hours. BNP (last 3 results) No results for input(s): PROBNP in the last 8760  hours. HbA1C: No results for input(s): HGBA1C in the last 72 hours. CBG: No results for input(s): GLUCAP in the last 168 hours. Lipid Profile: No results for input(s): CHOL, HDL, LDLCALC, TRIG, CHOLHDL, LDLDIRECT in the last 72 hours. Thyroid Function Tests: No results for input(s): TSH, T4TOTAL, FREET4, T3FREE, THYROIDAB in the last 72 hours. Anemia Panel: No results for input(s): VITAMINB12, FOLATE, FERRITIN, TIBC, IRON, RETICCTPCT in the last 72 hours. Sepsis Labs: Recent Labs  Lab 03/21/22 2206 03/22/22 0716  PROCALCITON  --  0.94  LATICACIDVEN 1.8  --     Recent Results (from the past 240 hour(s))  Resp Panel by RT-PCR (Flu A&B, Covid) Urine, In & Out Cath     Status: None   Collection Time: 03/21/22  9:59 PM   Specimen: Urine, In & Out Cath; Nasal Swab  Result Value Ref Range Status   SARS Coronavirus 2 by RT PCR NEGATIVE NEGATIVE Final    Comment: (NOTE) SARS-CoV-2 target nucleic acids are NOT DETECTED.  The SARS-CoV-2 RNA is generally detectable in upper respiratory specimens during the acute phase of infection. The lowest concentration of SARS-CoV-2 viral copies this assay can detect is 138 copies/mL. A negative result does not preclude SARS-Cov-2 infection and should not be used as the sole basis for treatment or other patient management decisions. A negative result may occur with  improper specimen collection/handling, submission of specimen other than nasopharyngeal swab, presence of viral mutation(s) within the areas targeted by this assay, and inadequate number of viral copies(<138 copies/mL). A negative result must be combined with clinical observations, patient history, and epidemiological information. The expected result is Negative.  Fact Sheet for Patients:  EntrepreneurPulse.com.au  Fact Sheet for Healthcare Providers:  IncredibleEmployment.be  This test is no t yet approved or cleared by the [place] FDA and   has been authorized for detection and/or diagnosis of SARS-CoV-2 by FDA under an Emergency Use Authorization (EUA). This EUA will remain  in effect (meaning this test can be used) for the duration of the COVID-19 declaration under Section 564(b)(1) of the Act, 21 U.S.C.section 360bbb-3(b)(1), unless the authorization is terminated  or revoked sooner.       Influenza A by PCR NEGATIVE NEGATIVE Final   Influenza  B by PCR NEGATIVE NEGATIVE Final    Comment: (NOTE) The Xpert Xpress SARS-CoV-2/FLU/RSV plus assay is intended as an aid in the diagnosis of influenza from Nasopharyngeal swab specimens and should not be used as a sole basis for treatment. Nasal washings and aspirates are unacceptable for Xpert Xpress SARS-CoV-2/FLU/RSV testing.  Fact Sheet for Patients: EntrepreneurPulse.com.au  Fact Sheet for Healthcare Providers: IncredibleEmployment.be  This test is not yet approved or cleared by the [place] FDA and has been authorized for detection and/or diagnosis of SARS-CoV-2 by FDA under an Emergency Use Authorization (EUA). This EUA will remain in effect (meaning this test can be used) for the duration of the COVID-19 declaration under Section 564(b)(1) of the Act, 21 U.S.C. section 360bbb-3(b)(1), unless the authorization is terminated or revoked.  Performed at [facility], [address] [place], [place] [address]   Blood Culture (routine x 2)     Status: None (Preliminary result)   Collection Time: 03/21/22 10:07 PM   Specimen: Site Not Specified; Blood  Result Value Ref Range Status   Specimen Description   Final    SITE NOT SPECIFIED BOTTLES DRAWN AEROBIC AND ANAEROBIC   Special Requests Blood Culture adequate volume  Final   Culture   Final    NO GROWTH < 12 HOURS Performed at [facility], [address], [place], [place] [address]    Report Status PENDING  Incomplete  Blood Culture (routine x 2)     Status: None  (Preliminary result)   Collection Time: 03/21/22 10:11 PM   Specimen: Site Not Specified; Blood  Result Value Ref Range Status   Specimen Description   Final    SITE NOT SPECIFIED BOTTLES DRAWN AEROBIC AND ANAEROBIC   Special Requests Blood Culture adequate volume  Final   Culture   Final    NO GROWTH < 12 HOURS Performed at [facility], [address] [place], [place] [address]    Report Status PENDING  Incomplete     Radiology Studies: DG Chest Port 1 View  Result Date: 03/21/2022 CLINICAL DATA:  Questionable sepsis - evaluate for abnormality Fever.  Lethargy. EXAM: PORTABLE CHEST 1 VIEW COMPARISON:  Radiograph 03/10/2022 FINDINGS: Right chest port remains in place. Persistent low lung volumes. Stable heart size and mediastinal contours. Aortic atherosclerosis. No focal airspace disease, pleural effusion, or pneumothorax. No acute osseous findings. IMPRESSION: Low lung volumes without acute findings. Electronically Signed   By: Keith Rake M.D.   On: 03/21/2022 21:42   CT CHEST ABDOMEN PELVIS WO CONTRAST  Result Date: 03/22/2022 CLINICAL DATA:  88 year old with fever. Sepsis. History of cancer, radiologic records indicates lymphocytosis. EXAM: CT CHEST, ABDOMEN AND PELVIS WITHOUT CONTRAST TECHNIQUE: Multidetector CT imaging of the chest, abdomen and pelvis was performed following the standard protocol without IV contrast. RADIATION DOSE REDUCTION: This exam was performed according to the departmental dose-optimization program which includes automated exposure control, adjustment of the mA and/or kV according to patient size and/or use of iterative reconstruction technique. COMPARISON:  Chest radiograph yesterday. Noncontrast abdominal CT 01/29/2022, PET CT 01/21/2022 FINDINGS: CT CHEST FINDINGS Cardiovascular: Right chest port with tip in the SVC. Aortic atherosclerosis without aneurysm. The heart is upper normal in size. No pericardial effusion. Decreased density of the blood pool  consistent with anemia. Mediastinum/Nodes: 14 mm right supraclavicular node. Anterior paratracheal nodal conglomerate measures 18 mm AP dimension. There are prominent bilateral axillary nodes. Limited assessment for hilar adenopathy on this unenhanced exam. No esophageal wall thickening. Lungs/Pleura: Diminished left pleural  effusion from prior PET. No acute airspace disease. No pulmonary nodule. No endobronchial lesion. Musculoskeletal: There are no acute or suspicious osseous abnormalities. CT ABDOMEN PELVIS FINDINGS Hepatobiliary: There is no evidence of focal hepatic lesion on this unenhanced exam. Question of periportal edema. Multiple gallstones without abnormal gallbladder distention or evidence of inflammation. Common bile duct is not well-defined in the absence of IV contrast. Pancreas: Not well assessed on the current exam. There is no evidence of pancreatic inflammation. More detailed assessment is limited. Spleen: No splenomegaly with spleen spanning 22 cm cranial caudal. No focal splenic abnormality on this unenhanced exam. Adrenals/Urinary Tract: The adrenal glands are poorly defined. Left kidney is displaced anterior inferiorly by splenomegaly. Probable cyst in the lower right kidney. There is no hydronephrosis. The urinary bladder is partially distended. Stomach/Bowel: Bowel evaluation is limited in the absence of enteric contrast and patient motion artifact. There is no evidence of bowel obstruction or inflammation. Mild colonic diverticulosis without focal diverticulitis. The appendix is normal. Vascular/Lymphatic: Aortic atherosclerosis. Patient's known retroperitoneal and hepatic duodenal adenopathy is difficult to accurately delineate on this unenhanced exam. There is a 15 mm left periaortic node in the upper abdomen. Reproductive: The uterus is not seen, presumably surgically absent. Other: Generalized body wall edema. There is no ascites or free air. Small fat containing umbilical hernia.  Musculoskeletal: There are no acute or suspicious osseous abnormalities. IMPRESSION: 1. Diminished left pleural effusion from prior PET. No evidence of pneumonia. 2. Thoracic adenopathy is similar to prior PET. Patient's known retroperitoneal and hepatic duodenal adenopathy is difficult to accurately delineate on the current exam. Again seen massive splenomegaly. 3. Cholelithiasis without evidence of acute cholecystitis. There may be periportal edema, which is not well assessed on this unenhanced exam. Common bile duct is not delineated. 4. Colonic diverticulosis without diverticulitis. 5. Generalized body wall edema. Aortic Atherosclerosis (ICD10-I70.0). Electronically Signed   By: Keith Rake M.D.   On: 03/22/2022 01:08    Scheduled Meds:  [START ON 03/25/2022] pantoprazole  40 mg Intravenous Q12H   Continuous Infusions:  ceFEPime (MAXIPIME) IV     lactated ringers 150 mL/hr at 03/22/22 0545   pantoprazole 8 mg/hr (03/22/22 0544)   [START ON 03/23/2022] vancomycin       LOS: 0 days   Darliss Cheney, MD [facility]  03/22/2022, 12:03 PM   *Please note that this is a verbal dictation therefore any spelling or grammatical errors are due to the "[place] One" system interpretation.  Please page via [place] and do not message via secure chat for urgent patient care matters. Secure chat can be used for non urgent patient care matters.  How to contact the [facility] Attending or Consulting provider [place] or covering provider during after hours [place], for this patient?  Check the care team in [facility] and look for a) attending/consulting [facility] provider listed and b) the [facility] team listed. Page or secure chat [phone]. Log into [email or web address] and use [place]'s universal password to access. If you do not have the password, please contact the hospital operator. Locate the [facility] provider you are looking for under [facility] and page to a number that you can be directly reached. If you still have  difficulty reaching the provider, please page the [facility] (Director on Call) for the Hospitalists listed on amion for assistance.

## 2022-03-22 NOTE — ED Notes (Signed)
Patient transported to CT 

## 2022-03-22 NOTE — NC FL2 (Signed)
[place] LEVEL OF CARE SCREENING TOOL     IDENTIFICATION  Patient Name: Faith Lee Birthdate: 1931/05/23 Sex: female Admission Date (Current Location): 03/21/2022  [facility] and [place] Number:  [employer] and Address:  [facility] [address], [place]      Provider Number: [id number]  Attending Physician Name and Address:  Darliss Cheney, MD  Relative Name and Phone Number:       Current Level of Care: Hospital Recommended Level of Care: [place] Prior Approval Number:    Date Approved/Denied:   PASRR Number:    Discharge Plan: SNF    Current Diagnoses: Patient Active Problem List   Diagnosis Date Noted   SIRS (systemic inflammatory response syndrome) ([place]) 03/22/2022   GI bleed 03/22/2022   Acute kidney injury superimposed on chronic kidney disease ([place]) 03/22/2022   Thrombocytopenia ([place]) 03/22/2022   Symptomatic anemia 03/22/2022   Acute blood loss anemia 03/22/2022   Essential hypertension 03/22/2022   Stage 3b chronic kidney disease (CKD) ([place]) 02/19/2022   Benign hypertension with chronic kidney disease, stage III ([place]) 02/19/2022   Bilateral lower extremity edema 02/19/2022   Chronic gout due to renal impairment 02/19/2022   Aortic atherosclerosis ([place]) 02/19/2022   Major depression, recurrent, chronic ([place]) 02/19/2022   Mantle cell lymphoma ([place]) 02/16/2022   Iron deficiency anemia due to chronic blood loss 05/26/2021   Lymphocytosis 05/12/2021   Normocytic anemia 05/12/2021    Orientation RESPIRATION BLADDER Height & Weight     Self, Time, Situation, Place  Normal Incontinent Weight: 142 lb 13.7 oz (64.8 kg) Height:  '5\' 4"'$  (162.6 cm)  BEHAVIORAL SYMPTOMS/MOOD NEUROLOGICAL BOWEL NUTRITION STATUS      Incontinent Diet (mechanical soft)  AMBULATORY STATUS COMMUNICATION OF NEEDS Skin   Extensive Assist Verbally Normal                       Personal Care Assistance Level  of Assistance  Bathing, Feeding, Dressing Bathing Assistance: Maximum assistance Feeding assistance: Limited assistance Dressing Assistance: Maximum assistance     Functional Limitations Info  Sight, Hearing, Speech Sight Info: Impaired Hearing Info: Adequate Speech Info: Adequate    SPECIAL CARE FACTORS FREQUENCY                       Contractures Contractures Info: Not present    Additional Factors Info  Code Status, Allergies Code Status Info: Full Allergies Info: Lidocaine, Rituximab-pvr           Current Medications (03/22/2022):  This is the current hospital active medication list Current Facility-Administered Medications  Medication Dose Route Frequency Provider Last Rate Last Admin   acetaminophen (TYLENOL) tablet 650 mg  650 mg Oral Q6H PRN Adefeso, Oladapo, DO       Or   acetaminophen (TYLENOL) suppository 650 mg  650 mg Rectal Q6H PRN Adefeso, Oladapo, DO       allopurinol (ZYLOPRIM) tablet 300 mg  300 mg Oral Daily Pahwani, Ravi, MD       ceFEPIme (MAXIPIME) 2 g in sodium chloride 0.9 % 100 mL IVPB  2 g Intravenous Q24H Adefeso, Oladapo, DO       lactated ringers infusion   Intravenous Continuous Adefeso, Oladapo, DO 150 mL/hr at 03/22/22 0545 New Bag at 03/22/22 0545   ondansetron (ZOFRAN) tablet 4 mg  4 mg Oral Q6H PRN Bernadette Hoit, DO       Or  ondansetron (ZOFRAN) injection 4 mg  4 mg Intravenous Q6H PRN Adefeso, Oladapo, DO       [START ON 03/25/2022] pantoprazole (PROTONIX) injection 40 mg  40 mg Intravenous Q12H Adefeso, Oladapo, DO       pantoprozole (PROTONIX) 80 mg /NS 100 mL infusion  8 mg/hr Intravenous Continuous Adefeso, Oladapo, DO 10 mL/hr at 03/22/22 0544 8 mg/hr at 03/22/22 0544   [START ON 03/23/2022] vancomycin (VANCOREADY) IVPB 750 mg/150 mL  750 mg Intravenous Q48H Adefeso, Oladapo, DO         Discharge Medications: Please see discharge summary for a list of discharge medications.  Relevant Imaging Results:  Relevant Lab  Results:   Additional Information    Shade Flood, LCSW

## 2022-03-22 NOTE — H&P (Signed)
History and Physical    Patient: Faith Lee JJO:[id number] DOB: [birth year] DOA: 03/21/2022 DOS: the patient was seen and examined on 03/22/2022 PCP: Gerlene Fee, NP  Patient coming from: SN[facility]  Chief Complaint:  Chief Complaint  Patient presents with   Fever   HPI: Faith Lee is a 88 y.o. female with medical history significant of stage IV mantle cell lymphoma who presents to the emergency department via EMS with generalized weakness and fever.  At bedside, patient was somnolent, though arousable, but was unable to provide any history, history was obtained from ED physician and ED medical record.  Per report, patient had recent chemotherapy, she denies chest pain, shortness of breath, cough or headache or abdominal pain or nausea, vomiting or diarrhea.  ED Course:  In the emergency department, patient was tachycardic, tachypneic, febrile with a temperature of 102.22F, BP was 111/44.  Work-up in the ED showed leukocytosis, H/H6.9/23.5 (this was 7.9/24.7 on 03/18/2022), platelets 105.  BUN/creatinine 67/2.27 (baseline creatinine at 1.2-1.5).  Fecal occult blood was positive.  Blood culture was pending. CT chest, abdomen and pelvis without contrast showed Diminished left pleural effusion from prior PET. No evidence of pneumonia. Patient was treated with Tylenol, IV vancomycin and cefepime, Flagyl was given, IV hydration was provided. Hospitalist was asked to admit patient for further evaluation and management.  Review of Systems: Review of systems as noted in the HPI. All other systems reviewed and are negative.   Past Medical History:  Diagnosis Date   Anxiety    Cancer ([place])    GAD (generalized anxiety disorder)    Hypertension    IBS (irritable bowel syndrome)    Irritable bowel disease    Osteoarthritis    Renal disorder    Past Surgical History:  Procedure Laterality Date   [place] and 2005   PORTACATH  PLACEMENT Right 03/10/2022   Procedure: INSERTION PORT-A-CATH, RIGHT IJ;  Surgeon: Rusty Aus, DO;  Location: [facility];  Service: General;  Laterality: Right;    Social History:  reports that she has never smoked. She has never used smokeless tobacco. She reports that she does not drink alcohol and does not use drugs.   Allergies  Allergen Reactions   Lidocaine Other (See Comments) and Hypertension    Patient had chest tightness with stomach pain, numbness on left side of limbs and unable to speak  PT received lidocaine 03-10-22 for port insertion with no reaction   Rituximab-Pvvr Other (See Comments)    Patient complained of chest pressure/pain     Family History  Problem Relation Age of Onset   Renal Disease Sister    Multiple myeloma Sister      Prior to Admission medications   Medication Sig Start Date End Date Taking? Authorizing Provider  allopurinol (ZYLOPRIM) 100 MG tablet Take 200 mg by mouth daily. 03/19/22  Yes [provider]  diphenhydrAMINE (BENADRYL) 25 MG tablet Take 25 mg by mouth every 6 (six) hours as needed.   Yes [provider]  dronabinol (MARINOL) 2.5 MG capsule Take 1 capsule (2.5 mg total) by mouth daily before lunch. 03/18/22  Yes Gerlene Fee, NP  furosemide (LASIX) 20 MG tablet Take 20 mg by mouth daily. 05/02/21  Yes [provider]  LORazepam (ATIVAN) 0.5 MG tablet Take 1 tablet (0.5 mg total) by mouth 2 (two) times daily. 03/05/22  Yes Gerlene Fee, NP  metoprolol tartrate (  LOPRESSOR) 25 MG tablet Take 25 mg by mouth 2 (two) times daily. 05/02/21  Yes [provider]  allopurinol (ZYLOPRIM) 300 MG tablet Take 1 tablet (300 mg total) by mouth daily. 01/11/22   Derek Jack, MD  docusate sodium (COLACE) 100 MG capsule Take 1 capsule (100 mg total) by mouth at bedtime. May take 2 capsules if needed. 02/25/22   Harriett Rush, PA-C  Ensure Max Protein (ENSURE MAX PROTEIN) LIQD due to increased  protein/energy needs related to cancer treatments Three Times A Day    [provider]  losartan (COZAAR) 100 MG tablet Take 100 mg by mouth daily.    [provider]  mirtazapine (REMERON) 7.5 MG tablet Take 7.5 mg by mouth at bedtime. 12/14/21   [provider]  oxyCODONE (ROXICODONE) 5 MG immediate release tablet Take 1 tablet (5 mg total) by mouth every 8 (eight) hours as needed. 03/10/22   Pappayliou, Flint Melter, DO    Physical Exam: BP (!) 106/51 (BP Location: Left Arm)   Pulse 100   Temp 98.8 F (37.1 C) (Oral)   Resp (!) 23   Ht $R'5\' 4"'DQ$  (1.626 m)   Wt 64.8 kg   SpO2 100%   BMI 24.52 kg/m   General: 88 y.o. year-old female ill appearing, but in no acute distress.  Somnolent, though easily arousable  HEENT: NCAT Neck: Supple, trachea medial Cardiovascular: Tachycardia, regular rate and rhythm with no rubs or gallops.  No thyromegaly or JVD noted.  No lower extremity edema. 2/4 pulses in all 4 extremities. Respiratory: Tachypnea clear to auscultation with no wheezes or rales. Good inspiratory effort. Abdomen: Soft, nontender nondistended with normal bowel sounds x4 quadrants. Muskuloskeletal: No cyanosis, clubbing or edema noted bilaterally Neuro: No focal neurological deficit.  Sensation, reflexes intact Skin: No ulcerative lesions noted or rashes Psychiatry: This cannot be assessed at this time due to patient being sleepy          Labs on Admission:  Basic Metabolic Panel: Recent Labs  Lab 03/18/22 0759 03/21/22 2206  NA 137 143  K 4.5 4.8  CL 105 112*  CO2 26 24  GLUCOSE 105* 106*  BUN 77* 67*  CREATININE 2.30* 2.27*  CALCIUM 7.9* 7.9*  MG 2.8*  --   PHOS 3.5  --    Liver Function Tests: Recent Labs  Lab 03/18/22 0759 03/21/22 2206  AST 27 32  ALT 11 13  ALKPHOS 46 38  BILITOT 0.7 0.9  PROT 5.3* 5.7*  ALBUMIN 3.3* 3.4*   No results for input(s): LIPASE, AMYLASE in the last 168 hours. No results for input(s): AMMONIA in the  last 168 hours. CBC: Recent Labs  Lab 03/18/22 0759 03/21/22 2206  WBC 22.4* 12.5*  NEUTROABS 3.1 2.3  HGB 7.9* 6.9*  HCT 24.7* 23.5*  MCV 104.7* 112.4*  PLT 88* 105*   Cardiac Enzymes: No results for input(s): CKTOTAL, CKMB, CKMBINDEX, TROPONINI in the last 168 hours.  BNP (last 3 results) No results for input(s): BNP in the last 8760 hours.  ProBNP (last 3 results) No results for input(s): PROBNP in the last 8760 hours.  CBG: No results for input(s): GLUCAP in the last 168 hours.  Radiological Exams on Admission: DG Chest Port 1 View  Result Date: 03/21/2022 CLINICAL DATA:  Questionable sepsis - evaluate for abnormality Fever.  Lethargy. EXAM: PORTABLE CHEST 1 VIEW COMPARISON:  Radiograph 03/10/2022 FINDINGS: Right chest port remains in place. Persistent low lung volumes. Stable heart size and mediastinal  contours. Aortic atherosclerosis. No focal airspace disease, pleural effusion, or pneumothorax. No acute osseous findings. IMPRESSION: Low lung volumes without acute findings. Electronically Signed   By: Keith Rake M.D.   On: 03/21/2022 21:42   CT CHEST ABDOMEN PELVIS WO CONTRAST  Result Date: 03/22/2022 CLINICAL DATA:  88 year old with fever. Sepsis. History of cancer, radiologic records indicates lymphocytosis. EXAM: CT CHEST, ABDOMEN AND PELVIS WITHOUT CONTRAST TECHNIQUE: Multidetector CT imaging of the chest, abdomen and pelvis was performed following the standard protocol without IV contrast. RADIATION DOSE REDUCTION: This exam was performed according to the departmental dose-optimization program which includes automated exposure control, adjustment of the mA and/or kV according to patient size and/or use of iterative reconstruction technique. COMPARISON:  Chest radiograph yesterday. Noncontrast abdominal CT 01/29/2022, PET CT 01/21/2022 FINDINGS: CT CHEST FINDINGS Cardiovascular: Right chest port with tip in the SVC. Aortic atherosclerosis without aneurysm. The heart is  upper normal in size. No pericardial effusion. Decreased density of the blood pool consistent with anemia. Mediastinum/Nodes: 14 mm right supraclavicular node. Anterior paratracheal nodal conglomerate measures 18 mm AP dimension. There are prominent bilateral axillary nodes. Limited assessment for hilar adenopathy on this unenhanced exam. No esophageal wall thickening. Lungs/Pleura: Diminished left pleural effusion from prior PET. No acute airspace disease. No pulmonary nodule. No endobronchial lesion. Musculoskeletal: There are no acute or suspicious osseous abnormalities. CT ABDOMEN PELVIS FINDINGS Hepatobiliary: There is no evidence of focal hepatic lesion on this unenhanced exam. Question of periportal edema. Multiple gallstones without abnormal gallbladder distention or evidence of inflammation. Common bile duct is not well-defined in the absence of IV contrast. Pancreas: Not well assessed on the current exam. There is no evidence of pancreatic inflammation. More detailed assessment is limited. Spleen: No splenomegaly with spleen spanning 22 cm cranial caudal. No focal splenic abnormality on this unenhanced exam. Adrenals/Urinary Tract: The adrenal glands are poorly defined. Left kidney is displaced anterior inferiorly by splenomegaly. Probable cyst in the lower right kidney. There is no hydronephrosis. The urinary bladder is partially distended. Stomach/Bowel: Bowel evaluation is limited in the absence of enteric contrast and patient motion artifact. There is no evidence of bowel obstruction or inflammation. Mild colonic diverticulosis without focal diverticulitis. The appendix is normal. Vascular/Lymphatic: Aortic atherosclerosis. Patient's known retroperitoneal and hepatic duodenal adenopathy is difficult to accurately delineate on this unenhanced exam. There is a 15 mm left periaortic node in the upper abdomen. Reproductive: The uterus is not seen, presumably surgically absent. Other: Generalized body wall  edema. There is no ascites or free air. Small fat containing umbilical hernia. Musculoskeletal: There are no acute or suspicious osseous abnormalities. IMPRESSION: 1. Diminished left pleural effusion from prior PET. No evidence of pneumonia. 2. Thoracic adenopathy is similar to prior PET. Patient's known retroperitoneal and hepatic duodenal adenopathy is difficult to accurately delineate on the current exam. Again seen massive splenomegaly. 3. Cholelithiasis without evidence of acute cholecystitis. There may be periportal edema, which is not well assessed on this unenhanced exam. Common bile duct is not delineated. 4. Colonic diverticulosis without diverticulitis. 5. Generalized body wall edema. Aortic Atherosclerosis (ICD10-I70.0). Electronically Signed   By: Keith Rake M.D.   On: 03/22/2022 01:08    EKG: I independently viewed the EKG done and my findings are as followed: Sinus tachycardia at a rate of 110 bpm with nonspecific T wave abnormalities in lateral leads  Assessment/Plan Present on Admission:  SIRS (systemic inflammatory response syndrome) (HCC)  Mantle cell lymphoma (HCC)  Principal Problem:   SIRS (systemic inflammatory  response syndrome) (HCC) Active Problems:   Mantle cell lymphoma (HCC)   GI bleed   Acute kidney injury superimposed on chronic kidney disease (HCC)   Thrombocytopenia (HCC)   Symptomatic anemia   Acute blood loss anemia   Essential hypertension  SIRS with suspicion for sepsis Patient was febrile, tachypneic, tachycardic, and present with leukocytosis Source of infection unknown She was empirically started on vancomycin, cefepime, Flagyl, we shall continue vancomycin and cefepime at this time Continue Tylenol as needed Blood culture pending  Leukocytosis possibly secondary to above WBC 12.5, continue management as described above  Thrombocytopenia possibly reactive Platelets 105, continue to monitor platelet level  GI bleed Symptomatic  anemia/acute blood loss anemia H/H= H/H6.9/23.5 (this was 7.9/24.7 on 03/18/2022) Hemoccult was positive Type and crossmatch was done 2 units of PRBC was ordered to be  transfused Continue IV Protonix drip Gastroenterologist  will be consulted in the morning  Acute kidney injury on CKD stage IV BUN/creatinine 67/2.27 (baseline creatinine at 1.2-1.5). Renally adjust medications, avoid nephrotoxic agents/dehydration/hypotension  Essential hypertension BP meds will be held at this time due to soft BP   DVT prophylaxis: SCDs  Code Status: Full code  Consults: Gastroenterology  Family Communication: None at bedside  Severity of Illness: The appropriate patient status for this patient is INPATIENT. Inpatient status is judged to be reasonable and necessary in order to provide the required intensity of service to ensure the patient's safety. The patient's presenting symptoms, physical exam findings, and initial radiographic and laboratory data in the context of their chronic comorbidities is felt to place them at high risk for further clinical deterioration. Furthermore, it is not anticipated that the patient will be medically stable for discharge from the hospital within 2 midnights of admission.   * I certify that at the point of admission it is my clinical judgment that the patient will require inpatient hospital care spanning beyond 2 midnights from the point of admission due to high intensity of service, high risk for further deterioration and high frequency of surveillance required.*  Author: Bernadette Hoit, DO 03/22/2022 4:53 AM  For on call review [email or web address].

## 2022-03-22 NOTE — ED Notes (Signed)
Pts Nurse at [facility] updated on Pts status and pending admission to the hospital via telephone @ [phone]

## 2022-03-22 NOTE — ED Provider Notes (Signed)
  Physical Exam  BP (!) 101/49 (BP Location: Right Arm)   Pulse 96   Temp (!) 100.4 F (38 C)   Resp (!) 24   Ht '5\' 4"'$  (1.626 m)   Wt 64.8 kg   SpO2 99%   BMI 24.52 kg/m   Physical Exam Vitals and nursing note reviewed.  Constitutional:      Appearance: She is ill-appearing.     Comments: Patient is somnolent and acutely on chronic ill-appearing  HENT:     Head: Normocephalic.  Cardiovascular:     Rate and Rhythm: Normal rate and regular rhythm.  Pulmonary:     Effort: Pulmonary effort is normal.     Breath sounds: Normal breath sounds.  Musculoskeletal:        General: Normal range of motion.  Skin:    General: Skin is warm and dry.    Procedures  Procedures  ED Course / MDM  Care assumed from Dr. Ashok Cordia at shift change.  Patient awaiting results of CT scan and administration of IV fluids.  CT scan has resulted and is negative and fluids are ordered.  Patient remains somewhat hypotensive, but improved with an additional bolus of fluid and blood transfusion.  Patient will be admitted to the hospitalist service.       Veryl Speak, MD 03/22/22 [phone]

## 2022-03-22 NOTE — Progress Notes (Signed)
Pharmacy Antibiotic Note  Faith Lee is a 88 y.o. female admitted on 03/21/2022 with sepsis.  Pharmacy has been consulted for Vancomycin/Cefepime dosing. WBC 12.5. Noted renal dysfunction. Active chemo for mantle cell lymphoma.   Plan: Vancomycin 750 mg IV q48h >>>Estimated AUC: 480 Cefepime 2g IV q24h Trend WBC, temp, renal function  F/U infectious work-up Drug levels as indicated   Height: '5\' 4"'$  (162.6 cm) Weight: 64.8 kg (142 lb 13.7 oz) IBW/kg (Calculated) : 54.7  Temp (24hrs), Avg:101.4 F (38.6 C), Min:100.6 F (38.1 C), Max:102.2 F (39 C)  Recent Labs  Lab 03/18/22 0759 03/21/22 2206  WBC 22.4* 12.5*  CREATININE 2.30* 2.27*  LATICACIDVEN  --  1.8    Estimated Creatinine Clearance: 14.8 mL/min (A) (by C-G formula based on SCr of 2.27 mg/dL (H)).    Allergies  Allergen Reactions   Lidocaine Other (See Comments) and Hypertension    Patient had chest tightness with stomach pain, numbness on left side of limbs and unable to speak  PT received lidocaine 03-10-22 for port insertion with no reaction   Rituximab-Pvvr Other (See Comments)    Patient complained of chest pressure/pain    Narda Bonds, PharmD, [place] Pharmacist Phone: [phone]

## 2022-03-22 NOTE — H&P (View-Only) (Signed)
Gastroenterology Consult   Referring Provider: Dr. Thomes Dinning  Primary Care Physician:  Faith Holster, NP Primary Gastroenterologist:  Dr. Marletta Lor, previously unassigned  Patient ID: Faith Lee; [id number]; [birth year]   Admit date: 03/21/2022  LOS: 0 days   Date of Consultation: 03/22/2022  Reason for Consultation:  GI bleed  History of Present Illness   Faith Lee is an 88 y.o. year old female diagnosed with stage IV Mantel cell lymphoma, receiving first cycle of chemo on 02/18/22. Second cycle was due on 6/1, but this was held per Oncology. Normocytic anemia felt to be related to bone marrow infiltration by lymphoma. Presented yesterday to the ED with generalized weakness and fever, with Hgb 6.9. Heme positive. Previous Hgb 7.9 about 4 days ago.   Presented from the [facility] yesterday with fever and generalized weakness. Sepsis protocol initiated. Hgb 6.9, down from 7.9 several days prior. Heme positive. Received 2 units PRBCs, with improvement in Hgb to 10 this morning. CT chest/abd/pelvis without contrast with diminished left pleural effusion compared to prior PET, massive splenomegaly noted, cholelithiasis, diverticulosis, and generalized body wall edema. LFTs normal except for elevated Tbili at 2.0. Blood cultures no growth thus far. UA with rare bacteria, culture in process.   Patient does not have family at bedside. Difficult historian. She does not believe she has seen any overt GI bleeding. Denies constipation, diarrhea, abdominal pain, dysphagia. I do note she has had poor appetite, per notes by Oncology. She has also lost weight unintentionally. She believes she had a colonoscopy many years ago but unable to tell me any further information regarding this. Unknown family history of colorectal cancer or polyps.         Past Medical History:  Diagnosis Date   Anxiety    Cancer (HCC)    GAD (generalized anxiety disorder)    Hypertension    IBS (irritable  bowel syndrome)    Irritable bowel disease    Osteoarthritis    Renal disorder     Past Surgical History:  Procedure Laterality Date   ABDOMINAL HYSTERECTOMY  1974   CARDIAC CATHETERIZATION     1991 and 2005   PORTACATH PLACEMENT Right 03/10/2022   Procedure: INSERTION PORT-A-CATH, RIGHT IJ;  Surgeon: Lewie Chamber, DO;  Location: [facility];  Service: General;  Laterality: Right;    Prior to Admission medications   Medication Sig Start Date End Date Taking? Authorizing Provider  allopurinol (ZYLOPRIM) 300 MG tablet Take 1 tablet (300 mg total) by mouth daily. Patient taking differently: Take 200 mg by mouth daily. 01/11/22  Yes Doreatha Massed, MD  diphenhydrAMINE (BENADRYL) 25 MG tablet Take 25 mg by mouth every 6 (six) hours as needed for allergies or itching.   Yes [provider]  dronabinol (MARINOL) 2.5 MG capsule Take 1 capsule (2.5 mg total) by mouth daily before lunch. 03/18/22  Yes Faith Holster, NP  Ensure Max Protein (ENSURE MAX PROTEIN) LIQD due to increased protein/energy needs related to cancer treatments Three Times A Day   Yes [provider]  furosemide (LASIX) 20 MG tablet Take 20 mg by mouth daily. 05/02/21  Yes [provider]  LORazepam (ATIVAN) 0.5 MG tablet Take 1 tablet (0.5 mg total) by mouth 2 (two) times daily. 03/05/22  Yes Faith Holster, NP  metoprolol tartrate (LOPRESSOR) 25 MG tablet Take 25 mg by mouth 2 (two) times daily. 05/02/21  Yes [provider]  mirtazapine (REMERON) 7.5 MG tablet Take 7.5 mg  by mouth at bedtime. 12/14/21  Yes [provider]  sennosides-docusate sodium (SENOKOT-S) 8.6-50 MG tablet Take 1 tablet by mouth in the morning and at bedtime.   Yes [provider]  docusate sodium (COLACE) 100 MG capsule Take 1 capsule (100 mg total) by mouth at bedtime. May take 2 capsules if needed. Patient not taking: Reported on 03/22/2022 02/25/22   Carnella Gu[place], PA-C  oxyCODONE  (ROXICODONE) 5 MG immediate release tablet Take 1 tablet (5 mg total) by mouth every 8 (eight) hours as needed. Patient not taking: Reported on 03/22/2022 03/10/22   Pappayliou, Gustavus Messing, DO    Current Facility-Administered Medications  Medication Dose Route Frequency Provider Last Rate Last Admin   acetaminophen (TYLENOL) tablet 650 mg  650 mg Oral Q6H PRN Adefeso, Oladapo, DO       Or   acetaminophen (TYLENOL) suppository 650 mg  650 mg Rectal Q6H PRN Adefeso, Oladapo, DO       ceFEPIme (MAXIPIME) 2 g in sodium chloride 0.9 % 100 mL IVPB  2 g Intravenous Q24H Adefeso, Oladapo, DO       lactated ringers infusion   Intravenous Continuous Adefeso, Oladapo, DO 150 mL/hr at 03/22/22 0545 New Bag at 03/22/22 0545   ondansetron (ZOFRAN) tablet 4 mg  4 mg Oral Q6H PRN Adefeso, Oladapo, DO       Or   ondansetron (ZOFRAN) injection 4 mg  4 mg Intravenous Q6H PRN Adefeso, Oladapo, DO       [START ON 03/25/2022] pantoprazole (PROTONIX) injection 40 mg  40 mg Intravenous Q12H Adefeso, Oladapo, DO       pantoprozole (PROTONIX) 80 mg /NS 100 mL infusion  8 mg/hr Intravenous Continuous Adefeso, Oladapo, DO 10 mL/hr at 03/22/22 0544 8 mg/hr at 03/22/22 0544   [START ON 03/23/2022] vancomycin (VANCOREADY) IVPB 750 mg/150 mL  750 mg Intravenous Q48H Adefeso, Oladapo, DO        Allergies as of 03/21/2022 - Review Complete 03/21/2022  Allergen Reaction Noted   Lidocaine Other (See Comments) and Hypertension 01/05/2012   Rituximab-pvvr Other (See Comments) 02/18/2022    Family History  Problem Relation Age of Onset   Renal Disease Sister    Multiple myeloma Sister     Social History   Socioeconomic History   Marital status: Widowed    Spouse name: Not on file   Number of children: Not on file   Years of education: Not on file   Highest education level: Not on file  Occupational History   Not on file  Tobacco Use   Smoking status: Never   Smokeless tobacco: Never  Vaping Use   Vaping Use: Never  used  Substance and Sexual Activity   Alcohol use: No   Drug use: No   Sexual activity: Not on file  Other Topics Concern   Not on file  Social History Narrative   Not on file   Social Determinants of Health   Financial Resource Strain: Low Risk    Difficulty of Paying Living Expenses: Not very hard  Food Insecurity: No Food Insecurity   Worried About Running Out of Food in the Last Year: Never true   Ran Out of Food in the Last Year: Never true  Transportation Needs: No Transportation Needs   Lack of Transportation (Medical): No   Lack of Transportation (Non-Medical): No  Physical Activity: Insufficiently Active   Days of Exercise per Week: 2 days   Minutes of Exercise per Session: 10 min  Stress: Stress Concern Present   Feeling of Stress : To some extent  Social Connections: Moderately Integrated   Frequency of Communication with Friends and Family: More than three times a week   Frequency of Social Gatherings with Friends and Family: Twice a week   Attends Religious Services: More than 4 times per year   Active Member of [employer] or Organizations: Yes   Attends Archivist Meetings: 1 to 4 times per year   Marital Status: Widowed  Human resources officer Violence: Not At Risk   Fear of Current or Ex-Partner: No   Emotionally Abused: No   Physically Abused: No   Sexually Abused: No     Review of Systems   Limited as poor historian.   Physical Exam   Vital Signs in last 24 hours: Temp:  [98.4 F (36.9 C)-102.2 F (39 C)] 100.2 F (37.9 C) (06/05 0808) Pulse Rate:  [96-113] 99 (06/05 0836) Resp:  [19-42] 19 (06/05 0800) BP: (87-124)/(39-57) 108/52 (06/05 0836) SpO2:  [91 %-100 %] 100 % (06/05 0836) Weight:  [64.8 kg] 64.8 kg (06/04 2102)    General:   Alert,  chronically ill-appearing. Difficult historian.  Head:  Normocephalic and atraumatic. Eyes:  Sclera clear, no icterus.    Ears:  Normal auditory acuity. Lungs:  Clear throughout to auscultation.     Heart:  S1 S2 present Abdomen:  Soft, nontender and nondistended. Marked splenomegaly on exam.  Rectal: deferred   Msk:  Symmetrical without gross deformities. Normal posture. Extremities:  With pedal edema, 1+ lower extremity edema Neurologic:  Alert and  oriented to person, year.  Skin:  Intact without significant lesions or rashes. Psych:  Alert and cooperative. Normal mood and affect.  Intake/Output from previous day: 06/04 0701 - 06/05 0700 In: 3050 [I.V.:20; Blood:630; IV Piggyback:2400] Out: -  Intake/Output this shift: No intake/output data recorded.    Labs/Studies   Recent Labs Recent Labs    03/21/22 2206 03/22/22 0716  WBC 12.5* 14.3*  HGB 6.9* 10.0*  HCT 23.5* 32.1*  PLT 105* 91*   BMET Recent Labs    03/21/22 2206 03/22/22 0716  NA 143 142  K 4.8 4.7  CL 112* 114*  CO2 24 23  GLUCOSE 106* 96  BUN 67* 61*  CREATININE 2.27* 2.06*  CALCIUM 7.9* 7.5*   LFT Recent Labs    03/21/22 2206 03/22/22 0716  PROT 5.7* 5.1*  ALBUMIN 3.4* 3.1*  AST 32 39  ALT 13 11  ALKPHOS 38 35*  BILITOT 0.9 2.0*   PT/INR Recent Labs    03/21/22 2206  LABPROT 16.9*  INR 1.4*     Radiology/Studies DG Chest Port 1 View  Result Date: 03/21/2022 CLINICAL DATA:  Questionable sepsis - evaluate for abnormality Fever.  Lethargy. EXAM: PORTABLE CHEST 1 VIEW COMPARISON:  Radiograph 03/10/2022 FINDINGS: Right chest port remains in place. Persistent low lung volumes. Stable heart size and mediastinal contours. Aortic atherosclerosis. No focal airspace disease, pleural effusion, or pneumothorax. No acute osseous findings. IMPRESSION: Low lung volumes without acute findings. Electronically Signed   By: Keith Rake M.D.   On: 03/21/2022 21:42   CT CHEST ABDOMEN PELVIS WO CONTRAST  Result Date: 03/22/2022 CLINICAL DATA:  88 year old with fever. Sepsis. History of cancer, radiologic records indicates lymphocytosis. EXAM: CT CHEST, ABDOMEN AND PELVIS WITHOUT CONTRAST  TECHNIQUE: Multidetector CT imaging of the chest, abdomen and pelvis was performed following the standard protocol without IV contrast. RADIATION DOSE REDUCTION: This exam was performed according to  the departmental dose-optimization program which includes automated exposure control, adjustment of the mA and/or kV according to patient size and/or use of iterative reconstruction technique. COMPARISON:  Chest radiograph yesterday. Noncontrast abdominal CT 01/29/2022, PET CT 01/21/2022 FINDINGS: CT CHEST FINDINGS Cardiovascular: Right chest port with tip in the SVC. Aortic atherosclerosis without aneurysm. The heart is upper normal in size. No pericardial effusion. Decreased density of the blood pool consistent with anemia. Mediastinum/Nodes: 14 mm right supraclavicular node. Anterior paratracheal nodal conglomerate measures 18 mm AP dimension. There are prominent bilateral axillary nodes. Limited assessment for hilar adenopathy on this unenhanced exam. No esophageal wall thickening. Lungs/Pleura: Diminished left pleural effusion from prior PET. No acute airspace disease. No pulmonary nodule. No endobronchial lesion. Musculoskeletal: There are no acute or suspicious osseous abnormalities. CT ABDOMEN PELVIS FINDINGS Hepatobiliary: There is no evidence of focal hepatic lesion on this unenhanced exam. Question of periportal edema. Multiple gallstones without abnormal gallbladder distention or evidence of inflammation. Common bile duct is not well-defined in the absence of IV contrast. Pancreas: Not well assessed on the current exam. There is no evidence of pancreatic inflammation. More detailed assessment is limited. Spleen: No splenomegaly with spleen spanning 22 cm cranial caudal. No focal splenic abnormality on this unenhanced exam. Adrenals/Urinary Tract: The adrenal glands are poorly defined. Left kidney is displaced anterior inferiorly by splenomegaly. Probable cyst in the lower right kidney. There is no  hydronephrosis. The urinary bladder is partially distended. Stomach/Bowel: Bowel evaluation is limited in the absence of enteric contrast and patient motion artifact. There is no evidence of bowel obstruction or inflammation. Mild colonic diverticulosis without focal diverticulitis. The appendix is normal. Vascular/Lymphatic: Aortic atherosclerosis. Patient's known retroperitoneal and hepatic duodenal adenopathy is difficult to accurately delineate on this unenhanced exam. There is a 15 mm left periaortic node in the upper abdomen. Reproductive: The uterus is not seen, presumably surgically absent. Other: Generalized body wall edema. There is no ascites or free air. Small fat containing umbilical hernia. Musculoskeletal: There are no acute or suspicious osseous abnormalities. IMPRESSION: 1. Diminished left pleural effusion from prior PET. No evidence of pneumonia. 2. Thoracic adenopathy is similar to prior PET. Patient's known retroperitoneal and hepatic duodenal adenopathy is difficult to accurately delineate on the current exam. Again seen massive splenomegaly. 3. Cholelithiasis without evidence of acute cholecystitis. There may be periportal edema, which is not well assessed on this unenhanced exam. Common bile duct is not delineated. 4. Colonic diverticulosis without diverticulitis. 5. Generalized body wall edema. Aortic Atherosclerosis (ICD10-I70.0). Electronically Signed   By: Keith Rake M.D.   On: 03/22/2022 01:08     Assessment   Faith Lee is an 88 y.o. 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 Second cycle was due on 6/1, but this was held per Oncology. Presented yesterday to the ED from the [facility] with generalized weakness, fever, and concern for sepsis of unknown origin. GI consulted due to acute on chronic anemia and heme positive stool.  Acute on chronic anemia: she has history of normocytic anemia  felt to be secondary to bone marrow infiltration by lymphoma. Now with heme positive stool and worsening anemia but no overt GI bleeding that I am aware. Family not at bedside. I have attempted to contact daughter but had to leave message. Last colonoscopy many years ago per patient. When clinically stable, recommend colonoscopy/EGD if able to tolerate. She has responded well to 2 units  PRBCs, improving Hgb from 6.9 to 10 this morning.   SIRS: empirically started on antibiotics. Urine culture and blood cultures pending.   Elevated bilirubin: 2.0 this morning. Non-specific. No other abnormal LFTs. Will fractionate bilirubin. If jump in LFTs, needs RUQ [place].     Plan / Recommendations    Continue supportive measures Follow H/H Fractionate bilirubin Monitor for overt GI bleeding On Protonix infusion: could change to IV BID at this point.  Recommend colonoscopy if able to tolerate prep and EGD when clinically stabilized Will continue to follow with you     03/22/2022, 12:00 PM  Annitta Needs, PhD, [facility] Gastroenterology

## 2022-03-22 NOTE — TOC Initial Note (Signed)
Transition of Care [facility]) - Initial/Assessment Note    Patient Details  Name: Faith Lee MRN: [id number] Date of Birth: Mar 07, 1931  [facility] CM/SW Contact:    Shade Flood, LCSW Phone Number: 03/22/2022, 12:31 PM  Clinical Narrative:                  Pt admitted from long term care at [facility]. At baseline pt A&O x4. Pt lethargic today and unable to participate in assessment. Spoke with Marianna Fuss at [facility] to update. Per Marianna Fuss, pt recently came to them in the LTC. Pt can return at dc.   TOC will follow and assist as needed with dc planning.   Expected Discharge Plan: Long Term Nursing Home Barriers to Discharge: Continued Medical Work up   Patient Goals and CMS Choice        Expected Discharge Plan and Services Expected Discharge Plan: [place] In-house Referral: Clinical Social Work     Living arrangements for the past 2 months: [place]                                      Prior Living Arrangements/Services Living arrangements for the past 2 months: [place] Lives with:: Facility Resident Patient language and need for interpreter reviewed:: Yes Do you feel safe going back to the place where you live?: Yes      Need for Family Participation in Patient Care: No (Comment)     Criminal Activity/Legal Involvement Pertinent to Current Situation/Hospitalization: No - Comment as needed  Activities of Daily Living      Permission Sought/Granted                  Emotional Assessment       Orientation: : Oriented to Self, Oriented to Place, Oriented to  Time, Oriented to Situation Alcohol / Substance Use: Not Applicable Psych Involvement: No (comment)  Admission diagnosis:  Dehydration [E86.0] Thrombocytopenia (HCC) [D69.6] Heme positive stool [R19.5] Mantle cell lymphoma, lymph nodes of multiple sites ([place]) [C83.18] SIRS (systemic inflammatory response syndrome) ([place])  [R65.10] Acute febrile illness [R50.9] AKI (acute kidney injury) ([place]) [N17.9] Sepsis due to urinary tract infection ([place]) [A41.9, N39.0] Symptomatic anemia [D64.9] Patient Active Problem List   Diagnosis Date Noted   SIRS (systemic inflammatory response syndrome) ([place]) 03/22/2022   GI bleed 03/22/2022   Acute kidney injury superimposed on chronic kidney disease ([place]) 03/22/2022   Thrombocytopenia ([place]) 03/22/2022   Symptomatic anemia 03/22/2022   Acute blood loss anemia 03/22/2022   Essential hypertension 03/22/2022   Stage 3b chronic kidney disease (CKD) ([place]) 02/19/2022   Benign hypertension with chronic kidney disease, stage III ([place]) 02/19/2022   Bilateral lower extremity edema 02/19/2022   Chronic gout due to renal impairment 02/19/2022   Aortic atherosclerosis ([place]) 02/19/2022   Major depression, recurrent, chronic ([place]) 02/19/2022   Mantle cell lymphoma ([place]) 02/16/2022   Iron deficiency anemia due to chronic blood loss 05/26/2021   Lymphocytosis 05/12/2021   Normocytic anemia 05/12/2021   PCP:  Gerlene Fee, NP Pharmacy:   [place], [place] [facility] [address] [place] [address] Phone: [phone] Fax: [phone]     Social Determinants of Health (SDOH) Interventions    Readmission Risk Interventions    03/22/2022   12:30 PM  Readmission Risk Prevention Plan  Transportation Screening  Complete  HRI or Home Care Consult Complete  Social Work Consult for [place] Planning/Counseling Complete  Palliative Care Screening Not Applicable  Medication Review Press photographer) Complete

## 2022-03-23 ENCOUNTER — Encounter: Payer: Medicare Other | Admitting: Surgery

## 2022-03-23 ENCOUNTER — Other Ambulatory Visit (HOSPITAL_COMMUNITY): Payer: Medicare Other

## 2022-03-23 ENCOUNTER — Inpatient Hospital Stay (HOSPITAL_COMMUNITY): Payer: Medicare Other

## 2022-03-23 ENCOUNTER — Other Ambulatory Visit: Payer: Self-pay

## 2022-03-23 DIAGNOSIS — R6510 Systemic inflammatory response syndrome (SIRS) of non-infectious origin without acute organ dysfunction: Secondary | ICD-10-CM | POA: Diagnosis not present

## 2022-03-23 DIAGNOSIS — L8990 Pressure ulcer of unspecified site, unspecified stage: Secondary | ICD-10-CM | POA: Insufficient documentation

## 2022-03-23 LAB — TYPE AND SCREEN
ABO/RH(D): A POS
Antibody Screen: NEGATIVE
Unit division: 0
Unit division: 0

## 2022-03-23 LAB — CBC WITH DIFFERENTIAL/PLATELET
Abs Immature Granulocytes: 0.05 10*3/uL (ref 0.00–0.07)
Basophils Absolute: 0 10*3/uL (ref 0.0–0.1)
Basophils Relative: 0 %
Eosinophils Absolute: 0.4 10*3/uL (ref 0.0–0.5)
Eosinophils Relative: 4 %
HCT: 27.2 % — ABNORMAL LOW (ref 36.0–46.0)
Hemoglobin: 8.5 g/dL — ABNORMAL LOW (ref 12.0–15.0)
Immature Granulocytes: 1 %
Lymphocytes Relative: 51 %
Lymphs Abs: 5.1 10*3/uL — ABNORMAL HIGH (ref 0.7–4.0)
MCH: 32.2 pg (ref 26.0–34.0)
MCHC: 31.3 g/dL (ref 30.0–36.0)
MCV: 103 fL — ABNORMAL HIGH (ref 80.0–100.0)
Monocytes Absolute: 2.2 10*3/uL — ABNORMAL HIGH (ref 0.1–1.0)
Monocytes Relative: 23 %
Neutro Abs: 2 10*3/uL (ref 1.7–7.7)
Neutrophils Relative %: 21 %
Platelets: 82 10*3/uL — ABNORMAL LOW (ref 150–400)
RBC: 2.64 MIL/uL — ABNORMAL LOW (ref 3.87–5.11)
RDW: 20.7 % — ABNORMAL HIGH (ref 11.5–15.5)
WBC: 9.7 10*3/uL (ref 4.0–10.5)
nRBC: 0.2 % (ref 0.0–0.2)

## 2022-03-23 LAB — BPAM RBC
Blood Product Expiration Date: 202306292359
Blood Product Expiration Date: 202307022359
ISSUE DATE / TIME: 202306050050
ISSUE DATE / TIME: 202306050301
Unit Type and Rh: 6200
Unit Type and Rh: 6200

## 2022-03-23 LAB — COMPREHENSIVE METABOLIC PANEL
ALT: 12 U/L (ref 0–44)
AST: 44 U/L — ABNORMAL HIGH (ref 15–41)
Albumin: 2.7 g/dL — ABNORMAL LOW (ref 3.5–5.0)
Alkaline Phosphatase: 33 U/L — ABNORMAL LOW (ref 38–126)
Anion gap: 9 (ref 5–15)
BUN: 70 mg/dL — ABNORMAL HIGH (ref 8–23)
CO2: 23 mmol/L (ref 22–32)
Calcium: 7.7 mg/dL — ABNORMAL LOW (ref 8.9–10.3)
Chloride: 114 mmol/L — ABNORMAL HIGH (ref 98–111)
Creatinine, Ser: 2.29 mg/dL — ABNORMAL HIGH (ref 0.44–1.00)
GFR, Estimated: 20 mL/min — ABNORMAL LOW (ref >60)
Glucose, Bld: 83 mg/dL (ref 70–99)
Potassium: 4.6 mmol/L (ref 3.5–5.1)
Sodium: 146 mmol/L — ABNORMAL HIGH (ref 135–145)
Total Bilirubin: 1.6 mg/dL — ABNORMAL HIGH (ref 0.3–1.2)
Total Protein: 4.4 g/dL — ABNORMAL LOW (ref 6.5–8.1)

## 2022-03-23 LAB — URINE CULTURE: Culture: NO GROWTH

## 2022-03-23 MED ORDER — SODIUM CHLORIDE 0.9 % IV SOLN: INTRAVENOUS | Status: DC

## 2022-03-23 MED ORDER — ALLOPURINOL 100 MG PO TABS
200.0000 mg | ORAL_TABLET | Freq: Every day | ORAL | Status: DC
  Administered 2022-03-24 – 2022-03-26 (×3): 200 mg via ORAL
  Filled 2022-03-23 (×4): qty 2

## 2022-03-23 MED ORDER — PANTOPRAZOLE SODIUM 40 MG IV SOLR
40.0000 mg | Freq: Two times a day (BID) | INTRAVENOUS | Status: DC
Start: 2022-03-23 — End: 2022-03-28
  Administered 2022-03-23 – 2022-03-28 (×10): 40 mg via INTRAVENOUS
  Filled 2022-03-23 (×10): qty 10

## 2022-03-23 NOTE — Progress Notes (Signed)
PROGRESS NOTE    Faith Lee  WER:[id number] DOB: September 15, 1931 DOA: 03/21/2022 PCP: Gerlene Fee, NP   Brief Narrative:  HPI: Faith Lee is a 88 y.o. female with medical history significant of stage IV mantle cell lymphoma who presents to the emergency department via EMS with generalized weakness and fever.  At bedside, patient was somnolent, though arousable, but was unable to provide any history, history was obtained from ED physician and ED medical record.  Per report, patient had recent chemotherapy, she denies chest pain, shortness of breath, cough or headache or abdominal pain or nausea, vomiting or diarrhea.   ED Course:  In the emergency department, patient was tachycardic, tachypneic, febrile with a temperature of 102.30F, BP was 111/44.  Work-up in the ED showed leukocytosis, H/H6.9/23.5 (this was 7.9/24.7 on 03/18/2022), platelets 105.  BUN/creatinine 67/2.27 (baseline creatinine at 1.2-1.5).  Fecal occult blood was positive.  Blood culture was pending. CT chest, abdomen and pelvis without contrast showed Diminished left pleural effusion from prior PET. No evidence of pneumonia. Patient was treated with Tylenol, IV vancomycin and cefepime, Flagyl was given, IV hydration was provided. Hospitalist was asked to admit patient for further evaluation and management.  Assessment & Plan:   Principal Problem:   SIRS (systemic inflammatory response syndrome) (HCC) Active Problems:   Mantle cell lymphoma (HCC)   GI bleed   Acute kidney injury superimposed on chronic kidney disease (HCC)   Thrombocytopenia (HCC)   Symptomatic anemia   Acute blood loss anemia   Essential hypertension   Heme positive stool   Pressure injury of skin  SIRS/?  UTI: Upon presentation, patient was febrile, tachycardic and tachypneic with leukocytosis.  Source of infection is not clear yet but appears to be likely UTI.  Chest x-ray negative.  CT chest abdomen pelvis is negative for acute  pathology.  UA is cloudy with rare bacteria.  Talked to her daughter who told me that patient had seen urologist sometime in the beginning or middle of May and was diagnosed with UTI.  She was given full course of antibiotics.  She was then diagnosed with UTI once again at her nursing home and was given antibiotic but she broke out in rash so antibiotics were discontinued after 1-2 doses.  Patient continues to have persistent fever however it is now low-grade with last temperature spike around 6 PM on 03/22/2022.  Much improved today.  Blood culture and urine culture negative to date.  Interestingly, despite of having history of chronic leukocytosis, leukocytosis has resolved as well.  Continue current broad-spectrum antibiotics.  Acute on chronic blood loss anemia/GI bleed, likely upper: Patient's baseline hemoglobin appears to be around 7.7, patient was found to have 6.9, she received 2 units of PRBC transfusion.  FOBT was positive.  Posttransfusion hemoglobin over 8.  Seen by GI, they recommend EGD and colonoscopy once patient is slightly stable, continue Protonix.   Acute toxic encephalopathy: Significant improvement compared to yesterday.  Patient fully alert and oriented and talkative.  CT head negative.  Patient is following all commands, no focal deficit.  Doubt stroke.  Will not pursue MRI.  This was likely secondary to sepsis.  Continue to treat underlying cause.  Stage IVb mantle cell lymphoma: Receiving chemotherapy Cycle 1 of Bendamustine and rituximab on 02/18/2022.  Bendamustine was dose reduced to 50 mg per metered square.  Second cycle was planned on 03/18/2022 however she was very weak with diarrhea so this was postponed.  If she were to  have continuous fever, may consider consulting oncology for their opinion.  AKI on CKD 3A: Patient's baseline creatinine around 1.2-1.5 with GFR around 38, presented with creatinine of 2.29, essentially stable since admission.  Continue IV fluids.  Essential  hypertension: Blood pressure on the low normal side.  Continue to hold metoprolol.  TLS prophylaxis: Resume allopurinol.  Bilateral lower extremity edema: Hold Lasix due to AKI.  DVT prophylaxis: SCDs Start: 03/22/22 0323, avoiding heparin products due to possible GI bleed.   Code Status: Full Code  Family Communication:  None present at bedside.   Status is: Inpatient Remains inpatient appropriate because: Patient very sick, needs EGD and colonoscopy once stable.   Estimated body mass index is 24.52 kg/m as calculated from the following:   Height as of this encounter: '5\' 4"'$  (1.626 m).   Weight as of this encounter: 64.8 kg.  Pressure Injury 03/22/22 Coccyx Mid Stage 2 -  Partial thickness loss of dermis presenting as a shallow open injury with a red, pink wound bed without slough. 3 cm x 0.25 cm (Active)  03/22/22 0900  Location: Coccyx (cleft between buttocks)  Location Orientation: Mid  Staging: Stage 2 -  Partial thickness loss of dermis presenting as a shallow open injury with a red, pink wound bed without slough.  Wound Description (Comments): 3 cm x 0.25 cm  Present on Admission: Yes   Nutritional Assessment: Body mass index is 24.52 kg/m.Marland Kitchen Seen by dietician.  I agree with the assessment and plan as outlined below: Nutrition Status:        . Skin Assessment: I have examined the patient's skin and I agree with the wound assessment as performed by the wound care RN as outlined below: Pressure Injury 03/22/22 Coccyx Mid Stage 2 -  Partial thickness loss of dermis presenting as a shallow open injury with a red, pink wound bed without slough. 3 cm x 0.25 cm (Active)  03/22/22 0900  Location: Coccyx (cleft between buttocks)  Location Orientation: Mid  Staging: Stage 2 -  Partial thickness loss of dermis presenting as a shallow open injury with a red, pink wound bed without slough.  Wound Description (Comments): 3 cm x 0.25 cm  Present on Admission: Yes    Consultants:   GI  Procedures:  None  Antimicrobials:  Anti-infectives (From admission, onward)    Start     Dose/Rate Route Frequency Ordered Stop   03/23/22 2200  vancomycin (VANCOREADY) IVPB 750 mg/150 mL        750 mg 150 mL/hr over 60 Minutes Intravenous Every 48 hours 03/22/22 0024     03/22/22 2200  ceFEPIme (MAXIPIME) 2 g in sodium chloride 0.9 % 100 mL IVPB        2 g 200 mL/hr over 30 Minutes Intravenous Every 24 hours 03/22/22 0013     03/21/22 2200  ceFEPIme (MAXIPIME) 2 g in sodium chloride 0.9 % 100 mL IVPB        2 g 200 mL/hr over 30 Minutes Intravenous  Once 03/21/22 2159 03/21/22 2256   03/21/22 2200  metroNIDAZOLE (FLAGYL) IVPB 500 mg        500 mg 100 mL/hr over 60 Minutes Intravenous  Once 03/21/22 2159 03/21/22 2328   03/21/22 2200  vancomycin (VANCOCIN) IVPB 1000 mg/200 mL premix        1,000 mg 200 mL/hr over 60 Minutes Intravenous  Once 03/21/22 2159 03/21/22 2328         Subjective: Seen and examined.  Patient  is fully alert and oriented and she has no complaints.  Objective: Vitals:   03/22/22 1413 03/22/22 1742 03/22/22 2056 03/23/22 0521  BP: (!) 104/42 (!) 126/57 (!) 114/50 (!) 115/58  Pulse: 94 98 (!) 104 (!) 106  Resp: '20 20 16 16  '$ Temp: (!) 100.7 F (38.2 C) (!) 100.7 F (38.2 C) 100.1 F (37.8 C) 98.6 F (37 C)  TempSrc: Axillary Oral Oral Oral  SpO2: 98% 96% 97% 96%  Weight:      Height:        Intake/Output Summary (Last 24 hours) at 03/23/2022 1151 Last data filed at 03/23/2022 0900 Gross per 24 hour  Intake 912.2 ml  Output 1200 ml  Net -287.8 ml    Filed Weights   03/21/22 2102  Weight: 64.8 kg    Examination:  General exam: Appears calm and comfortable  Respiratory system: Clear to auscultation. Respiratory effort normal. Cardiovascular system: S1 & S2 heard, RRR. No JVD, murmurs, rubs, gallops or clicks. No pedal edema. Gastrointestinal system: Abdomen is nondistended, soft and nontender. No organomegaly or masses felt. Normal  bowel sounds heard. Central nervous system: Alert and oriented. No focal neurological deficits. Extremities: Symmetric 5 x 5 power. Skin: No rashes, lesions or ulcers.     Data Reviewed: I have personally reviewed following labs and imaging studies  CBC: Recent Labs  Lab 03/18/22 0759 03/21/22 2206 03/22/22 0716 03/23/22 0423  WBC 22.4* 12.5* 14.3* 9.7  NEUTROABS 3.1 2.3  --  2.0  HGB 7.9* 6.9* 10.0* 8.5*  HCT 24.7* 23.5* 32.1* 27.2*  MCV 104.7* 112.4* 102.9* 103.0*  PLT 88* 105* 91* 82*    Basic Metabolic Panel: Recent Labs  Lab 03/18/22 0759 03/21/22 2206 03/22/22 0716 03/23/22 0423  NA 137 143 142 146*  K 4.5 4.8 4.7 4.6  CL 105 112* 114* 114*  CO2 '26 24 23 23  '$ GLUCOSE 105* 106* 96 83  BUN 77* 67* 61* 70*  CREATININE 2.30* 2.27* 2.06* 2.29*  CALCIUM 7.9* 7.9* 7.5* 7.7*  MG 2.8*  --  2.5*  --   PHOS 3.5  --  2.7  --     GFR: Estimated Creatinine Clearance: 14.7 mL/min (A) (by C-G formula based on SCr of 2.29 mg/dL (H)). Liver Function Tests: Recent Labs  Lab 03/18/22 0759 03/21/22 2206 03/22/22 0716 03/22/22 1532 03/23/22 0423  AST 27 32 39  --  44*  ALT '11 13 11  '$ --  12  ALKPHOS 46 38 35*  --  33*  BILITOT 0.7 0.9 2.0* 1.8* 1.6*  PROT 5.3* 5.7* 5.1*  --  4.4*  ALBUMIN 3.3* 3.4* 3.1*  --  2.7*    No results for input(s): LIPASE, AMYLASE in the last 168 hours. Recent Labs  Lab 03/22/22 1656  AMMONIA 23   Coagulation Profile: Recent Labs  Lab 03/21/22 2206  INR 1.4*    Cardiac Enzymes: No results for input(s): CKTOTAL, CKMB, CKMBINDEX, TROPONINI in the last 168 hours. BNP (last 3 results) No results for input(s): PROBNP in the last 8760 hours. HbA1C: No results for input(s): HGBA1C in the last 72 hours. CBG: No results for input(s): GLUCAP in the last 168 hours. Lipid Profile: No results for input(s): CHOL, HDL, LDLCALC, TRIG, CHOLHDL, LDLDIRECT in the last 72 hours. Thyroid Function Tests: No results for input(s): TSH, T4TOTAL,  FREET4, T3FREE, THYROIDAB in the last 72 hours. Anemia Panel: No results for input(s): VITAMINB12, FOLATE, FERRITIN, TIBC, IRON, RETICCTPCT in the last 72 hours. Sepsis Labs:  Recent Labs  Lab 03/21/22 2206 03/22/22 0716  PROCALCITON  --  0.94  LATICACIDVEN 1.8  --      Recent Results (from the past 240 hour(s))  Urine Culture     Status: None   Collection Time: 03/21/22  9:23 PM   Specimen: In/Out Cath Urine  Result Value Ref Range Status   Specimen Description   Final    IN/OUT CATH URINE Performed at [facility], [address] [place], [place] [address]    Special Requests   Final    NONE Performed at [facility], [address] [place], [place] [address]    Culture   Final    NO GROWTH Performed at [facility], [place] [address], [place], [place] [address]    Report Status 03/23/2022 FINAL  Final  Resp Panel by RT-PCR (Flu A&B, Covid) Urine, In & Out Cath     Status: None   Collection Time: 03/21/22  9:59 PM   Specimen: Urine, In & Out Cath; Nasal Swab  Result Value Ref Range Status   SARS Coronavirus 2 by RT PCR NEGATIVE NEGATIVE Final    Comment: (NOTE) SARS-CoV-2 target nucleic acids are NOT DETECTED.  The SARS-CoV-2 RNA is generally detectable in upper respiratory specimens during the acute phase of infection. The lowest concentration of SARS-CoV-2 viral copies this assay can detect is 138 copies/mL. A negative result does not preclude SARS-Cov-2 infection and should not be used as the sole basis for treatment or other patient management decisions. A negative result may occur with  improper specimen collection/handling, submission of specimen other than nasopharyngeal swab, presence of viral mutation(s) within the areas targeted by this assay, and inadequate number of viral copies(<138 copies/mL). A negative result must be combined with clinical observations, patient history, and epidemiological information. The expected result is Negative.  Fact  Sheet for Patients:  EntrepreneurPulse.com.au  Fact Sheet for Healthcare Providers:  IncredibleEmployment.be  This test is no t yet approved or cleared by the [place] FDA and  has been authorized for detection and/or diagnosis of SARS-CoV-2 by FDA under an Emergency Use Authorization (EUA). This EUA will remain  in effect (meaning this test can be used) for the duration of the COVID-19 declaration under Section 564(b)(1) of the Act, 21 U.S.C.section 360bbb-3(b)(1), unless the authorization is terminated  or revoked sooner.       Influenza A by PCR NEGATIVE NEGATIVE Final   Influenza B by PCR NEGATIVE NEGATIVE Final    Comment: (NOTE) The Xpert Xpress SARS-CoV-2/FLU/RSV plus assay is intended as an aid in the diagnosis of influenza from Nasopharyngeal swab specimens and should not be used as a sole basis for treatment. Nasal washings and aspirates are unacceptable for Xpert Xpress SARS-CoV-2/FLU/RSV testing.  Fact Sheet for Patients: EntrepreneurPulse.com.au  Fact Sheet for Healthcare Providers: IncredibleEmployment.be  This test is not yet approved or cleared by the [place] FDA and has been authorized for detection and/or diagnosis of SARS-CoV-2 by FDA under an Emergency Use Authorization (EUA). This EUA will remain in effect (meaning this test can be used) for the duration of the COVID-19 declaration under Section 564(b)(1) of the Act, 21 U.S.C. section 360bbb-3(b)(1), unless the authorization is terminated or revoked.  Performed at [facility], [address] [place], [place] [address]   Blood Culture (routine x 2)     Status: None (Preliminary result)   Collection Time: 03/21/22 10:07 PM   Specimen: Site Not Specified; Blood  Result Value Ref Range  Status   Specimen Description   Final    SITE NOT SPECIFIED BOTTLES DRAWN AEROBIC AND ANAEROBIC   Special Requests Blood Culture adequate  volume  Final   Culture   Final    NO GROWTH 2 DAYS Performed at [facility], [address] [place], [place] [address]    Report Status PENDING  Incomplete  Blood Culture (routine x 2)     Status: None (Preliminary result)   Collection Time: 03/21/22 10:11 PM   Specimen: Site Not Specified; Blood  Result Value Ref Range Status   Specimen Description   Final    SITE NOT SPECIFIED BOTTLES DRAWN AEROBIC AND ANAEROBIC   Special Requests Blood Culture adequate volume  Final   Culture   Final    NO GROWTH 2 DAYS Performed at [facility], [address] [place], [place] [address]    Report Status PENDING  Incomplete      Radiology Studies: CT HEAD WO CONTRAST (5MM)  Result Date: 03/22/2022 CLINICAL DATA:  Neuro deficit, generalized weakness and fever EXAM: CT HEAD WITHOUT CONTRAST TECHNIQUE: Contiguous axial images were obtained from the base of the skull through the vertex without intravenous contrast. RADIATION DOSE REDUCTION: This exam was performed according to the departmental dose-optimization program which includes automated exposure control, adjustment of the mA and/or kV according to patient size and/or use of iterative reconstruction technique. COMPARISON:  CT head and MRI brain May 23, 2014 FINDINGS: Brain: Mild age related global parenchymal volume loss. Scattered and more confluent areas of white matter hypodensity nonspecific but most commonly reflecting chronic ischemic white matter disease. No evidence of large acute infarction, hemorrhage, hydrocephalus, extra-axial collection or mass lesion/mass effect. Dense dural calcifications. Vascular: No hyperdense vessel. Atherosclerotic calcifications of the internal carotid arteries at the skull base. Skull: Hyperostosis frontalis. Negative for fracture or focal lesion. Sinuses/Orbits: Opacification of the right frontal sinus with pneumatized partial opacification of the left frontal sinus. The remainder of the visualized paranasal  sinuses are predominantly clear. Orbits are grossly unremarkable. Other: Mastoid air cells are predominantly clear. Probable dense cerumen in the bilateral external auditory canals. IMPRESSION: No acute intracranial abnormality. Mild age-related global parenchymal volume loss and white matter changes of chronic ischemic small vessel disease. Frontal paranasal sinus disease. Electronically Signed   By: Dahlia Bailiff M.D.   On: 03/22/2022 17:31   DG Chest Port 1 View  Result Date: 03/21/2022 CLINICAL DATA:  Questionable sepsis - evaluate for abnormality Fever.  Lethargy. EXAM: PORTABLE CHEST 1 VIEW COMPARISON:  Radiograph 03/10/2022 FINDINGS: Right chest port remains in place. Persistent low lung volumes. Stable heart size and mediastinal contours. Aortic atherosclerosis. No focal airspace disease, pleural effusion, or pneumothorax. No acute osseous findings. IMPRESSION: Low lung volumes without acute findings. Electronically Signed   By: Keith Rake M.D.   On: 03/21/2022 21:42   CT CHEST ABDOMEN PELVIS WO CONTRAST  Result Date: 03/22/2022 CLINICAL DATA:  88 year old with fever. Sepsis. History of cancer, radiologic records indicates lymphocytosis. EXAM: CT CHEST, ABDOMEN AND PELVIS WITHOUT CONTRAST TECHNIQUE: Multidetector CT imaging of the chest, abdomen and pelvis was performed following the standard protocol without IV contrast. RADIATION DOSE REDUCTION: This exam was performed according to the departmental dose-optimization program which includes automated exposure control, adjustment of the mA and/or kV according to patient size and/or use of iterative reconstruction technique. COMPARISON:  Chest radiograph yesterday. Noncontrast abdominal CT 01/29/2022, PET CT 01/21/2022 FINDINGS: CT CHEST FINDINGS Cardiovascular: Right chest port with tip in the SVC. Aortic  atherosclerosis without aneurysm. The heart is upper normal in size. No pericardial effusion. Decreased density of the blood pool consistent  with anemia. Mediastinum/Nodes: 14 mm right supraclavicular node. Anterior paratracheal nodal conglomerate measures 18 mm AP dimension. There are prominent bilateral axillary nodes. Limited assessment for hilar adenopathy on this unenhanced exam. No esophageal wall thickening. Lungs/Pleura: Diminished left pleural effusion from prior PET. No acute airspace disease. No pulmonary nodule. No endobronchial lesion. Musculoskeletal: There are no acute or suspicious osseous abnormalities. CT ABDOMEN PELVIS FINDINGS Hepatobiliary: There is no evidence of focal hepatic lesion on this unenhanced exam. Question of periportal edema. Multiple gallstones without abnormal gallbladder distention or evidence of inflammation. Common bile duct is not well-defined in the absence of IV contrast. Pancreas: Not well assessed on the current exam. There is no evidence of pancreatic inflammation. More detailed assessment is limited. Spleen: No splenomegaly with spleen spanning 22 cm cranial caudal. No focal splenic abnormality on this unenhanced exam. Adrenals/Urinary Tract: The adrenal glands are poorly defined. Left kidney is displaced anterior inferiorly by splenomegaly. Probable cyst in the lower right kidney. There is no hydronephrosis. The urinary bladder is partially distended. Stomach/Bowel: Bowel evaluation is limited in the absence of enteric contrast and patient motion artifact. There is no evidence of bowel obstruction or inflammation. Mild colonic diverticulosis without focal diverticulitis. The appendix is normal. Vascular/Lymphatic: Aortic atherosclerosis. Patient's known retroperitoneal and hepatic duodenal adenopathy is difficult to accurately delineate on this unenhanced exam. There is a 15 mm left periaortic node in the upper abdomen. Reproductive: The uterus is not seen, presumably surgically absent. Other: Generalized body wall edema. There is no ascites or free air. Small fat containing umbilical hernia.  Musculoskeletal: There are no acute or suspicious osseous abnormalities. IMPRESSION: 1. Diminished left pleural effusion from prior PET. No evidence of pneumonia. 2. Thoracic adenopathy is similar to prior PET. Patient's known retroperitoneal and hepatic duodenal adenopathy is difficult to accurately delineate on the current exam. Again seen massive splenomegaly. 3. Cholelithiasis without evidence of acute cholecystitis. There may be periportal edema, which is not well assessed on this unenhanced exam. Common bile duct is not delineated. 4. Colonic diverticulosis without diverticulitis. 5. Generalized body wall edema. Aortic Atherosclerosis (ICD10-I70.0). Electronically Signed   By: Keith Rake M.D.   On: 03/22/2022 01:08    Scheduled Meds:  [START ON 03/24/2022] allopurinol  200 mg Oral Daily   pantoprazole  40 mg Intravenous Q12H   Continuous Infusions:  sodium chloride 75 mL/hr at 03/23/22 0837   ceFEPime (MAXIPIME) IV Stopped (03/22/22 2129)   vancomycin       LOS: 1 day   Darliss Cheney, MD [facility]  03/23/2022, 11:51 AM   *Please note that this is a verbal dictation therefore any spelling or grammatical errors are due to the "[place] [employer]" system interpretation.  Please page via [place] and do not message via secure chat for urgent patient care matters. Secure chat can be used for non urgent patient care matters.  How to contact the [facility] Attending or Consulting provider [place] or covering provider during after hours [place], for this patient?  Check the care team in [facility] and look for a) attending/consulting [facility] provider listed and b) the [facility] team listed. Page or secure chat [phone]. Log into [email or web address] and use [place]'s universal password to access. If you do not have the password, please contact the hospital operator. Locate the [facility] provider you are looking for under [facility] and page to  a number that you can be directly reached. If you still have  difficulty reaching the provider, please page the [facility] (Director on Call) for the Hospitalists listed on amion for assistance.

## 2022-03-23 NOTE — Progress Notes (Addendum)
Subjective: Feels ok this morning. Poor historian. Denies abdominal pain, nausea, vomiting. States she isn't hungry. Doesn't have a good appetite. Not sure if she has had a BM. Unaware of any brbpr or melena.   Spoke with nursing staff. No reported brbpr or melena.   Spoke with patient's daughter who reports patient told her she had some rectal bleeding after receiving a blood transfusion in early May.  No further reports of rectal bleeding.  Patient's daughter also reports patient's confusion/altered mental status is new.  She is normally alert and oriented x4 and will carry on a full conversation, and is a reliable historian.  She reports this has been worsening over the last week and she has been sleeping all the time.  Overall, she feels patient has been going downhill since receiving chemotherapy and being at the [facility]. She is agreeable for patient to have EGD/colonoscopy if needed.   Tried calling [facility] to obtain history regarding any reports of rectal bleeding. Nurse was at lunch. I left a message requesting a return call.   Objective: Vital signs in last 24 hours: Temp:  [98.6 F (37 C)-100.7 F (38.2 C)] 98.6 F (37 C) (06/06 0521) Pulse Rate:  [94-106] 106 (06/06 0521) Resp:  [16-20] 16 (06/06 0521) BP: (104-126)/(42-58) 115/58 (06/06 0521) SpO2:  [96 %-100 %] 96 % (06/06 0521) Last BM Date :  (unknown) General:   Alert and oriented to self and location, unaware of situation. NAD.  Head:  Normocephalic and atraumatic. Eyes:  No icterus, sclera clear. Conjuctiva pink.  Abdomen:  Bowel sounds present, soft, non-tender, non-distended. No HSM or hernias noted. No rebound or guarding. No masses appreciated  Msk:  Symmetrical without gross deformities. Normal posture. Extremities:  Without edema. Psych:  Normal mood and affect.  Intake/Output from previous day: 06/05 0701 - 06/06 0700 In: 792.2 [P.O.:480; I.V.:212.2; IV Piggyback:100] Out: 1000  [Urine:1000] Intake/Output this shift: Total I/O In: -  Out: 200 [Urine:200]  Lab Results: Recent Labs    03/21/22 2206 03/22/22 0716 03/23/22 0423  WBC 12.5* 14.3* 9.7  HGB 6.9* 10.0* 8.5*  HCT 23.5* 32.1* 27.2*  PLT 105* 91* 82*   BMET Recent Labs    03/21/22 2206 03/22/22 0716 03/23/22 0423  NA 143 142 146*  K 4.8 4.7 4.6  CL 112* 114* 114*  CO2 '24 23 23  '$ GLUCOSE 106* 96 83  BUN 67* 61* 70*  CREATININE 2.27* 2.06* 2.29*  CALCIUM 7.9* 7.5* 7.7*   LFT Recent Labs    03/21/22 2206 03/22/22 0716 03/22/22 1532 03/23/22 0423  PROT 5.7* 5.1*  --  4.4*  ALBUMIN 3.4* 3.1*  --  2.7*  AST 32 39  --  44*  ALT 13 11  --  12  ALKPHOS 38 35*  --  33*  BILITOT 0.9 2.0* 1.8* 1.6*  BILIDIR  --   --  0.7*  --   IBILI  --   --  1.1*  --    PT/INR Recent Labs    03/21/22 2206  LABPROT 16.9*  INR 1.4*    Studies/Results: CT HEAD WO CONTRAST (5MM)  Result Date: 03/22/2022 CLINICAL DATA:  Neuro deficit, generalized weakness and fever EXAM: CT HEAD WITHOUT CONTRAST TECHNIQUE: Contiguous axial images were obtained from the base of the skull through the vertex without intravenous contrast. RADIATION DOSE REDUCTION: This exam was performed according to the departmental dose-optimization program which includes automated exposure control, adjustment of the mA and/or kV according  to patient size and/or use of iterative reconstruction technique. COMPARISON:  CT head and MRI brain May 23, 2014 FINDINGS: Brain: Mild age related global parenchymal volume loss. Scattered and more confluent areas of white matter hypodensity nonspecific but most commonly reflecting chronic ischemic white matter disease. No evidence of large acute infarction, hemorrhage, hydrocephalus, extra-axial collection or mass lesion/mass effect. Dense dural calcifications. Vascular: No hyperdense vessel. Atherosclerotic calcifications of the internal carotid arteries at the skull base. Skull: Hyperostosis frontalis.  Negative for fracture or focal lesion. Sinuses/Orbits: Opacification of the right frontal sinus with pneumatized partial opacification of the left frontal sinus. The remainder of the visualized paranasal sinuses are predominantly clear. Orbits are grossly unremarkable. Other: Mastoid air cells are predominantly clear. Probable dense cerumen in the bilateral external auditory canals. IMPRESSION: No acute intracranial abnormality. Mild age-related global parenchymal volume loss and white matter changes of chronic ischemic small vessel disease. Frontal paranasal sinus disease. Electronically Signed   By: Dahlia Bailiff M.D.   On: 03/22/2022 17:31   DG Chest Port 1 View  Result Date: 03/21/2022 CLINICAL DATA:  Questionable sepsis - evaluate for abnormality Fever.  Lethargy. EXAM: PORTABLE CHEST 1 VIEW COMPARISON:  Radiograph 03/10/2022 FINDINGS: Right chest port remains in place. Persistent low lung volumes. Stable heart size and mediastinal contours. Aortic atherosclerosis. No focal airspace disease, pleural effusion, or pneumothorax. No acute osseous findings. IMPRESSION: Low lung volumes without acute findings. Electronically Signed   By: Keith Rake M.D.   On: 03/21/2022 21:42   CT CHEST ABDOMEN PELVIS WO CONTRAST  Result Date: 03/22/2022 CLINICAL DATA:  88 year old with fever. Sepsis. History of cancer, radiologic records indicates lymphocytosis. EXAM: CT CHEST, ABDOMEN AND PELVIS WITHOUT CONTRAST TECHNIQUE: Multidetector CT imaging of the chest, abdomen and pelvis was performed following the standard protocol without IV contrast. RADIATION DOSE REDUCTION: This exam was performed according to the departmental dose-optimization program which includes automated exposure control, adjustment of the mA and/or kV according to patient size and/or use of iterative reconstruction technique. COMPARISON:  Chest radiograph yesterday. Noncontrast abdominal CT 01/29/2022, PET CT 01/21/2022 FINDINGS: CT CHEST FINDINGS  Cardiovascular: Right chest port with tip in the SVC. Aortic atherosclerosis without aneurysm. The heart is upper normal in size. No pericardial effusion. Decreased density of the blood pool consistent with anemia. Mediastinum/Nodes: 14 mm right supraclavicular node. Anterior paratracheal nodal conglomerate measures 18 mm AP dimension. There are prominent bilateral axillary nodes. Limited assessment for hilar adenopathy on this unenhanced exam. No esophageal wall thickening. Lungs/Pleura: Diminished left pleural effusion from prior PET. No acute airspace disease. No pulmonary nodule. No endobronchial lesion. Musculoskeletal: There are no acute or suspicious osseous abnormalities. CT ABDOMEN PELVIS FINDINGS Hepatobiliary: There is no evidence of focal hepatic lesion on this unenhanced exam. Question of periportal edema. Multiple gallstones without abnormal gallbladder distention or evidence of inflammation. Common bile duct is not well-defined in the absence of IV contrast. Pancreas: Not well assessed on the current exam. There is no evidence of pancreatic inflammation. More detailed assessment is limited. Spleen: No splenomegaly with spleen spanning 22 cm cranial caudal. No focal splenic abnormality on this unenhanced exam. Adrenals/Urinary Tract: The adrenal glands are poorly defined. Left kidney is displaced anterior inferiorly by splenomegaly. Probable cyst in the lower right kidney. There is no hydronephrosis. The urinary bladder is partially distended. Stomach/Bowel: Bowel evaluation is limited in the absence of enteric contrast and patient motion artifact. There is no evidence of bowel obstruction or inflammation. Mild colonic diverticulosis without focal diverticulitis. The  appendix is normal. Vascular/Lymphatic: Aortic atherosclerosis. Patient's known retroperitoneal and hepatic duodenal adenopathy is difficult to accurately delineate on this unenhanced exam. There is a 15 mm left periaortic node in the  upper abdomen. Reproductive: The uterus is not seen, presumably surgically absent. Other: Generalized body wall edema. There is no ascites or free air. Small fat containing umbilical hernia. Musculoskeletal: There are no acute or suspicious osseous abnormalities. IMPRESSION: 1. Diminished left pleural effusion from prior PET. No evidence of pneumonia. 2. Thoracic adenopathy is similar to prior PET. Patient's known retroperitoneal and hepatic duodenal adenopathy is difficult to accurately delineate on the current exam. Again seen massive splenomegaly. 3. Cholelithiasis without evidence of acute cholecystitis. There may be periportal edema, which is not well assessed on this unenhanced exam. Common bile duct is not delineated. 4. Colonic diverticulosis without diverticulitis. 5. Generalized body wall edema. Aortic Atherosclerosis (ICD10-I70.0). Electronically Signed   By: Keith Rake M.D.   On: 03/22/2022 01:08    Assessment: 88 y.o. 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 Second cycle was due on 6/1, but this was held per Oncology. Presented yesterday to the ED on 6/4 from the [facility] with generalized weakness, fever, and concern for sepsis of unknown origin, also with AKI, and acute on chronic anemia. GI consulted due to acute on chronic anemia and heme positive stool.  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  Now with heme positive stool and worsening anemia with hemoglobin of 6.9 on admission, down from 7.9 on 6/1. BUN elevated, but in the setting of  elevated Cr. Received 2 units PRBCs with hemoglobin improved to 10.0 yesterday, back down to 8.5 this morning. No reported overt GI bleeding. We may be seeing equilibration/hydration effect. Would expect typical response to 2 units PRBCs to bring her up to around 8.9 rather than 10 from 6.9. Continue  to monitor. Transfuse for Hgb less than 7. When clinically stable, consider EGD and colonoscopy. May be able to prep tomorrow though I am not sure she will cooperate to consume the entire prep. Patient reports her last colonoscopy was about 10 years ago in [place] without polyps.   SIRS:  On admission with fever, tachycardia, tachypnea, leukocytosis.  Source of infection unknown, possibly secondary to UTI.  Urine culture in process.  Chest x-ray negative.  CT A/P negative for acute pathology.  Blood cultures pending (no growth x2 days).  Currently on empiric antibiotics and IV fluids. Leukocytosis has resolved.  Tmax over the last 24 hours 100.7, but afebrile this morning.  Mildly tachycardic at 106 this morning.   Elevated bilirubin: Acutely elevated bilirubin on admission at 2.0.  Yesterday, bilirubin fractionated with both direct and indirect were elevated, but primarily indirect.  As LFTs were within normal limits, suspect this is secondary to acute illness/sepsis/possible component of Gilbert's syndrome.  Bilirubin down to 1.6 this morning.  She does have slight elevation of AST at 44 this morning. Continue to monitor.   Acute encephalopathy:  Daughter reports mental status changes are new as patient is usually alert and oriented, will carry on full conversation, and is a reliable historian.  Per hospitalist, patient has had significant improvement today compared to yesterday. She is alert and oriented to self and location, and aware of situation.  CT head this admission was negative.  Overall, it is suspected that this is  secondary to sepsis.    Plan: Continue to trend H/H and monitor for overt GI bleeding.  Consider EGD/colonoscopy once clinically improved. May be able to prep tomorrow pending re-evaluation. Unclear if she will cooperate to drink entire colon prep consider alterations in mental status, though this is improving.    I did speak with patient's daughter today about the possibility of  procedures and she is agreeable with proceeding as needed. Clear liquids starting midnight tonight for possible colon prep tomorrow.  Change PPI infusion to IV PPI BID.  Continue to trend LFTs/bilirubin Continue supportive measures.    AddendumDamaris Schooner with nurse at [facility]. No reports of brbpr or melena.    LOS: 1 day    03/23/2022, 8:03 AM   Aliene Altes, [facility]

## 2022-03-24 ENCOUNTER — Ambulatory Visit (HOSPITAL_COMMUNITY): Payer: Medicare Other | Admitting: Hematology

## 2022-03-24 ENCOUNTER — Other Ambulatory Visit (HOSPITAL_COMMUNITY): Payer: Medicare Other

## 2022-03-24 ENCOUNTER — Ambulatory Visit (HOSPITAL_COMMUNITY): Payer: Medicare Other

## 2022-03-24 DIAGNOSIS — R197 Diarrhea, unspecified: Secondary | ICD-10-CM | POA: Diagnosis not present

## 2022-03-24 DIAGNOSIS — R195 Other fecal abnormalities: Secondary | ICD-10-CM | POA: Diagnosis not present

## 2022-03-24 DIAGNOSIS — D649 Anemia, unspecified: Secondary | ICD-10-CM

## 2022-03-24 DIAGNOSIS — R6510 Systemic inflammatory response syndrome (SIRS) of non-infectious origin without acute organ dysfunction: Secondary | ICD-10-CM | POA: Diagnosis not present

## 2022-03-24 LAB — CBC WITH DIFFERENTIAL/PLATELET
Abs Immature Granulocytes: 0.04 10*3/uL (ref 0.00–0.07)
Basophils Absolute: 0.1 10*3/uL (ref 0.0–0.1)
Basophils Relative: 1 %
Eosinophils Absolute: 0.5 10*3/uL (ref 0.0–0.5)
Eosinophils Relative: 5 %
HCT: 27.4 % — ABNORMAL LOW (ref 36.0–46.0)
Hemoglobin: 8.5 g/dL — ABNORMAL LOW (ref 12.0–15.0)
Immature Granulocytes: 0 %
Lymphocytes Relative: 53 %
Lymphs Abs: 5.4 10*3/uL — ABNORMAL HIGH (ref 0.7–4.0)
MCH: 32.8 pg (ref 26.0–34.0)
MCHC: 31 g/dL (ref 30.0–36.0)
MCV: 105.8 fL — ABNORMAL HIGH (ref 80.0–100.0)
Monocytes Absolute: 2.4 10*3/uL — ABNORMAL HIGH (ref 0.1–1.0)
Monocytes Relative: 23 %
Neutro Abs: 1.9 10*3/uL (ref 1.7–7.7)
Neutrophils Relative %: 18 %
Platelets: 79 10*3/uL — ABNORMAL LOW (ref 150–400)
RBC: 2.59 MIL/uL — ABNORMAL LOW (ref 3.87–5.11)
RDW: 19.7 % — ABNORMAL HIGH (ref 11.5–15.5)
WBC: 10.3 10*3/uL (ref 4.0–10.5)
nRBC: 0.2 % (ref 0.0–0.2)

## 2022-03-24 LAB — COMPREHENSIVE METABOLIC PANEL
ALT: 12 U/L (ref 0–44)
AST: 47 U/L — ABNORMAL HIGH (ref 15–41)
Albumin: 2.5 g/dL — ABNORMAL LOW (ref 3.5–5.0)
Alkaline Phosphatase: 36 U/L — ABNORMAL LOW (ref 38–126)
Anion gap: 8 (ref 5–15)
BUN: 72 mg/dL — ABNORMAL HIGH (ref 8–23)
CO2: 21 mmol/L — ABNORMAL LOW (ref 22–32)
Calcium: 7.6 mg/dL — ABNORMAL LOW (ref 8.9–10.3)
Chloride: 116 mmol/L — ABNORMAL HIGH (ref 98–111)
Creatinine, Ser: 2.43 mg/dL — ABNORMAL HIGH (ref 0.44–1.00)
GFR, Estimated: 19 mL/min — ABNORMAL LOW (ref >60)
Glucose, Bld: 91 mg/dL (ref 70–99)
Potassium: 4.4 mmol/L (ref 3.5–5.1)
Sodium: 145 mmol/L (ref 135–145)
Total Bilirubin: 1.3 mg/dL — ABNORMAL HIGH (ref 0.3–1.2)
Total Protein: 4.4 g/dL — ABNORMAL LOW (ref 6.5–8.1)

## 2022-03-24 MED ORDER — RISAQUAD PO CAPS
1.0000 | ORAL_CAPSULE | Freq: Every day | ORAL | Status: DC
  Administered 2022-03-24 – 2022-03-26 (×3): 1 via ORAL
  Filled 2022-03-24 (×4): qty 1

## 2022-03-24 MED ORDER — SODIUM CHLORIDE 0.9 % IV SOLN: INTRAVENOUS | Status: DC

## 2022-03-24 MED ORDER — METOPROLOL TARTRATE 25 MG PO TABS
12.5000 mg | ORAL_TABLET | Freq: Two times a day (BID) | ORAL | Status: DC
  Administered 2022-03-24 – 2022-03-26 (×5): 12.5 mg via ORAL
  Filled 2022-03-24 (×7): qty 1

## 2022-03-24 MED ORDER — PEG 3350-KCL-NA BICARB-NACL 420 G PO SOLR
4000.0000 mL | Freq: Once | ORAL | Status: AC
  Administered 2022-03-24: 4000 mL via ORAL

## 2022-03-24 NOTE — Progress Notes (Signed)
PROGRESS NOTE    [name]  WCH:[id number] DOB: 15-Feb-1931 DOA: 03/21/2022 PCP: Gerlene Fee, NP   Brief Narrative:  HPI: Faith Lee is a 88 y.o. female with medical history significant of stage IV mantle cell lymphoma who presents to the emergency department via EMS with generalized weakness and fever.  At bedside, patient was somnolent, though arousable, but was unable to provide any history, history was obtained from ED physician and ED medical record.  Per report, patient had recent chemotherapy, she denies chest pain, shortness of breath, cough or headache or abdominal pain or nausea, vomiting or diarrhea.   ED Course:  In the emergency department, patient was tachycardic, tachypneic, febrile with a temperature of 102.37F, BP was 111/44.  Work-up in the ED showed leukocytosis, H/H6.9/23.5 (this was 7.9/24.7 on 03/18/2022), platelets 105.  BUN/creatinine 67/2.27 (baseline creatinine at 1.2-1.5).  Fecal occult blood was positive.  Blood culture was pending. CT chest, abdomen and pelvis without contrast showed Diminished left pleural effusion from prior PET. No evidence of pneumonia. Patient was treated with Tylenol, IV vancomycin and cefepime, Flagyl was given, IV hydration was provided. Hospitalist was asked to admit patient for further evaluation and management.  Assessment & Plan:   Principal Problem:   SIRS (systemic inflammatory response syndrome) (HCC) Active Problems:   Mantle cell lymphoma (HCC)   GI bleed   Acute kidney injury superimposed on chronic kidney disease (HCC)   Thrombocytopenia (HCC)   Symptomatic anemia   Acute blood loss anemia   Essential hypertension   Heme positive stool   Pressure injury of skin  SIRS/?  UTI: Upon presentation, patient was febrile, tachycardic and tachypneic with leukocytosis.  Source of infection is not clear yet but appears to be likely UTI.  Chest x-ray negative.  CT chest abdomen pelvis is negative for acute  pathology.  UA is cloudy with rare bacteria.  Talked to her daughter who told me that patient had seen urologist sometime in the beginning or middle of May and was diagnosed with UTI.  She was given full course of antibiotics.  She was then diagnosed with UTI once again at her nursing home and was given antibiotic but she broke out in rash so antibiotics were discontinued after 1-2 doses.  Patient continues to have persistent fever however it is now low-grade with last temperature spike around 6 PM on 03/22/2022.  Much improved today.  Blood culture and urine culture negative to date.  Interestingly, despite of having history of chronic leukocytosis, leukocytosis has resolved as well.  No signs of MRSA.  Continue cefepime but discontinue vancomycin.  Acute on chronic blood loss anemia/GI bleed, likely upper: Patient's baseline hemoglobin appears to be around 7.7, patient was found to have 6.9, she received 2 units of PRBC transfusion.  FOBT was positive.  Posttransfusion hemoglobin over 8.  Seen by GI, they recommend EGD and colonoscopy once patient is slightly stable, continue Protonix.  In my opinion, patient is fairly stable enough to have EGD and colonoscopy tomorrow.  I have sent message to GI.  Acute toxic encephalopathy: Significant improvement compared to yesterday.  Patient fully alert and oriented and talkative.  CT head negative.  Patient is following all commands, no focal deficit.  Doubt stroke.  Will not pursue MRI.  This was likely secondary to sepsis.  Continue to treat underlying cause.  Stage IVb mantle cell lymphoma: Receiving chemotherapy Cycle 1 of Bendamustine and rituximab on 02/18/2022.  Bendamustine was dose reduced to 50  mg per metered square.  Second cycle was planned on 03/18/2022 however she was very weak with diarrhea so this was postponed.  AKI on CKD 3A: Patient's baseline creatinine around 1.2-1.5 with GFR around 38, presented with creatinine of 2.29, which is slightly worse than  her baseline despite of receiving IV fluids.  CT abdomen just and pelvis did not show any renal pathology.  We will consider nephrology consult if no improvement by tomorrow.  Essential hypertension: Blood pressure fairly stable but now she is slightly tachycardic, will resume metoprolol at lower dose of 12.5 mg twice daily.  TLS prophylaxis: Continue allopurinol.  Bilateral lower extremity edema: Hold Lasix due to AKI.  DVT prophylaxis: SCDs Start: 03/22/22 0323, avoiding heparin products due to possible GI bleed.   Code Status: Full Code  Family Communication: Daughter at bedside.  Status is: Inpatient Remains inpatient appropriate because: Patient improving but needs EGD and colonoscopy.   Estimated body mass index is 24.52 kg/m as calculated from the following:   Height as of this encounter: '5\' 4"'$  (1.626 m).   Weight as of this encounter: 64.8 kg.  Pressure Injury 03/22/22 Coccyx Mid Stage 2 -  Partial thickness loss of dermis presenting as a shallow open injury with a red, pink wound bed without slough. 3 cm x 0.25 cm (Active)  03/22/22 0900  Location: Coccyx (cleft between buttocks)  Location Orientation: Mid  Staging: Stage 2 -  Partial thickness loss of dermis presenting as a shallow open injury with a red, pink wound bed without slough.  Wound Description (Comments): 3 cm x 0.25 cm  Present on Admission: Yes  Dressing Type Foam - Lift dressing to assess site every shift 03/23/22 1959   Nutritional Assessment: Body mass index is 24.52 kg/m.Marland Kitchen Seen by dietician.  I agree with the assessment and plan as outlined below: Nutrition Status:        . Skin Assessment: I have examined the patient's skin and I agree with the wound assessment as performed by the wound care RN as outlined below: Pressure Injury 03/22/22 Coccyx Mid Stage 2 -  Partial thickness loss of dermis presenting as a shallow open injury with a red, pink wound bed without slough. 3 cm x 0.25 cm (Active)   03/22/22 0900  Location: Coccyx (cleft between buttocks)  Location Orientation: Mid  Staging: Stage 2 -  Partial thickness loss of dermis presenting as a shallow open injury with a red, pink wound bed without slough.  Wound Description (Comments): 3 cm x 0.25 cm  Present on Admission: Yes  Dressing Type Foam - Lift dressing to assess site every shift 03/23/22 1959    Consultants:  GI  Procedures:  None  Antimicrobials:  Anti-infectives (From admission, onward)    Start     Dose/Rate Route Frequency Ordered Stop   03/23/22 2200  vancomycin (VANCOREADY) IVPB 750 mg/150 mL  Status:  Discontinued        750 mg 150 mL/hr over 60 Minutes Intravenous Every 48 hours 03/22/22 0024 03/24/22 0820   03/22/22 2200  ceFEPIme (MAXIPIME) 2 g in sodium chloride 0.9 % 100 mL IVPB        2 g 200 mL/hr over 30 Minutes Intravenous Every 24 hours 03/22/22 0013     03/21/22 2200  ceFEPIme (MAXIPIME) 2 g in sodium chloride 0.9 % 100 mL IVPB        2 g 200 mL/hr over 30 Minutes Intravenous  Once 03/21/22 2159 03/21/22 2256   03/21/22  2200  metroNIDAZOLE (FLAGYL) IVPB 500 mg        500 mg 100 mL/hr over 60 Minutes Intravenous  Once 03/21/22 2159 03/21/22 2328   03/21/22 2200  vancomycin (VANCOCIN) IVPB 1000 mg/200 mL premix        1,000 mg 200 mL/hr over 60 Minutes Intravenous  Once 03/21/22 2159 03/21/22 2328         Subjective: Patient seen and examined.  Daughter at the bedside.  Patient fully alert and oriented and she even remembers me and our conversation yesterday.  She has no complaints.  Objective: Vitals:   03/23/22 0521 03/23/22 1353 03/23/22 2002 03/24/22 0340  BP: (!) 115/58 (!) 121/57 (!) 124/53 (!) 121/55  Pulse: (!) 106 98 (!) 101 99  Resp: '16 16 18 18  '$ Temp: 98.6 F (37 C) (!) 97.5 F (36.4 C) 98.7 F (37.1 C) 98.2 F (36.8 C)  TempSrc: Oral     SpO2: 96% 97% 98% 98%  Weight:      Height:        Intake/Output Summary (Last 24 hours) at 03/24/2022 1205 Last data  filed at 03/24/2022 1049 Gross per 24 hour  Intake 3257.4 ml  Output 500 ml  Net 2757.4 ml    Filed Weights   03/21/22 2102  Weight: 64.8 kg    Examination:  General exam: Appears calm and comfortable  Respiratory system: Clear to auscultation. Respiratory effort normal. Cardiovascular system: S1 & S2 heard, RRR. No JVD, murmurs, rubs, gallops or clicks. No pedal edema. Gastrointestinal system: Abdomen is nondistended, soft and nontender. No organomegaly or masses felt. Normal bowel sounds heard. Central nervous system: Alert and oriented. No focal neurological deficits. Extremities: Symmetric 5 x 5 power. Skin: No rashes, lesions or ulcers.    Data Reviewed: I have personally reviewed following labs and imaging studies  CBC: Recent Labs  Lab 03/18/22 0759 03/21/22 2206 03/22/22 0716 03/23/22 0423 03/24/22 0440  WBC 22.4* 12.5* 14.3* 9.7 10.3  NEUTROABS 3.1 2.3  --  2.0 1.9  HGB 7.9* 6.9* 10.0* 8.5* 8.5*  HCT 24.7* 23.5* 32.1* 27.2* 27.4*  MCV 104.7* 112.4* 102.9* 103.0* 105.8*  PLT 88* 105* 91* 82* 79*    Basic Metabolic Panel: Recent Labs  Lab 03/18/22 0759 03/21/22 2206 03/22/22 0716 03/23/22 0423 03/24/22 0440  NA 137 143 142 146* 145  K 4.5 4.8 4.7 4.6 4.4  CL 105 112* 114* 114* 116*  CO2 '26 24 23 23 '$ 21*  GLUCOSE 105* 106* 96 83 91  BUN 77* 67* 61* 70* 72*  CREATININE 2.30* 2.27* 2.06* 2.29* 2.43*  CALCIUM 7.9* 7.9* 7.5* 7.7* 7.6*  MG 2.8*  --  2.5*  --   --   PHOS 3.5  --  2.7  --   --     GFR: Estimated Creatinine Clearance: 13.8 mL/min (A) (by C-G formula based on SCr of 2.43 mg/dL (H)). Liver Function Tests: Recent Labs  Lab 03/18/22 0759 03/21/22 2206 03/22/22 0716 03/22/22 1532 03/23/22 0423 03/24/22 0440  AST 27 32 39  --  44* 47*  ALT '11 13 11  '$ --  12 12  ALKPHOS 46 38 35*  --  33* 36*  BILITOT 0.7 0.9 2.0* 1.8* 1.6* 1.3*  PROT 5.3* 5.7* 5.1*  --  4.4* 4.4*  ALBUMIN 3.3* 3.4* 3.1*  --  2.7* 2.5*    No results for input(s):  LIPASE, AMYLASE in the last 168 hours. Recent Labs  Lab 03/22/22 1656  AMMONIA 23  Coagulation Profile: Recent Labs  Lab 03/21/22 2206  INR 1.4*    Cardiac Enzymes: No results for input(s): CKTOTAL, CKMB, CKMBINDEX, TROPONINI in the last 168 hours. BNP (last 3 results) No results for input(s): PROBNP in the last 8760 hours. HbA1C: No results for input(s): HGBA1C in the last 72 hours. CBG: No results for input(s): GLUCAP in the last 168 hours. Lipid Profile: No results for input(s): CHOL, HDL, LDLCALC, TRIG, CHOLHDL, LDLDIRECT in the last 72 hours. Thyroid Function Tests: No results for input(s): TSH, T4TOTAL, FREET4, T3FREE, THYROIDAB in the last 72 hours. Anemia Panel: No results for input(s): VITAMINB12, FOLATE, FERRITIN, TIBC, IRON, RETICCTPCT in the last 72 hours. Sepsis Labs: Recent Labs  Lab 03/21/22 2206 03/22/22 0716  PROCALCITON  --  0.94  LATICACIDVEN 1.8  --      Recent Results (from the past 240 hour(s))  Urine Culture     Status: None   Collection Time: 03/21/22  9:23 PM   Specimen: In/Out Cath Urine  Result Value Ref Range Status   Specimen Description   Final    IN/OUT CATH URINE Performed at [facility], [address] [place], [place] [address]    Special Requests   Final    NONE Performed at [facility], [address], [place], [place] [address]    Culture   Final    NO GROWTH Performed at [facility], [place] [address], [place], [place] [address]    Report Status 03/23/2022 FINAL  Final  Resp Panel by RT-PCR (Flu A&B, Covid) Urine, In & Out Cath     Status: None   Collection Time: 03/21/22  9:59 PM   Specimen: Urine, In & Out Cath; Nasal Swab  Result Value Ref Range Status   SARS Coronavirus 2 by RT PCR NEGATIVE NEGATIVE Final    Comment: (NOTE) SARS-CoV-2 target nucleic acids are NOT DETECTED.  The SARS-CoV-2 RNA is generally detectable in upper respiratory specimens during the acute phase of infection. The  lowest concentration of SARS-CoV-2 viral copies this assay can detect is 138 copies/mL. A negative result does not preclude SARS-Cov-2 infection and should not be used as the sole basis for treatment or other patient management decisions. A negative result may occur with  improper specimen collection/handling, submission of specimen other than nasopharyngeal swab, presence of viral mutation(s) within the areas targeted by this assay, and inadequate number of viral copies(<138 copies/mL). A negative result must be combined with clinical observations, patient history, and epidemiological information. The expected result is Negative.  Fact Sheet for Patients:  EntrepreneurPulse.com.au  Fact Sheet for Healthcare Providers:  IncredibleEmployment.be  This test is no t yet approved or cleared by the [place] FDA and  has been authorized for detection and/or diagnosis of SARS-CoV-2 by FDA under an Emergency Use Authorization (EUA). This EUA will remain  in effect (meaning this test can be used) for the duration of the COVID-19 declaration under Section 564(b)(1) of the Act, 21 U.S.C.section 360bbb-3(b)(1), unless the authorization is terminated  or revoked sooner.       Influenza A by PCR NEGATIVE NEGATIVE Final   Influenza B by PCR NEGATIVE NEGATIVE Final    Comment: (NOTE) The Xpert Xpress SARS-CoV-2/FLU/RSV plus assay is intended as an aid in the diagnosis of influenza from Nasopharyngeal swab specimens and should not be used as a sole basis for treatment. Nasal washings and aspirates are unacceptable for Xpert Xpress SARS-CoV-2/FLU/RSV testing.  Fact Sheet for Patients: EntrepreneurPulse.com.au  Fact Sheet  for Healthcare Providers: IncredibleEmployment.be  This test is not yet approved or cleared by the [place] and has been authorized for detection and/or diagnosis of SARS-CoV-2 by FDA under  an Emergency Use Authorization (EUA). This EUA will remain in effect (meaning this test can be used) for the duration of the COVID-19 declaration under Section 564(b)(1) of the Act, 21 U.S.C. section 360bbb-3(b)(1), unless the authorization is terminated or revoked.  Performed at [facility], [address] [place], [place] [address]   Blood Culture (routine x 2)     Status: None (Preliminary result)   Collection Time: 03/21/22 10:07 PM   Specimen: Site Not Specified; Blood  Result Value Ref Range Status   Specimen Description   Final    SITE NOT SPECIFIED BOTTLES DRAWN AEROBIC AND ANAEROBIC   Special Requests Blood Culture adequate volume  Final   Culture   Final    NO GROWTH 3 DAYS Performed at [facility], [address] [place], [place] [address]    Report Status PENDING  Incomplete  Blood Culture (routine x 2)     Status: None (Preliminary result)   Collection Time: 03/21/22 10:11 PM   Specimen: Site Not Specified; Blood  Result Value Ref Range Status   Specimen Description   Final    SITE NOT SPECIFIED BOTTLES DRAWN AEROBIC AND ANAEROBIC   Special Requests Blood Culture adequate volume  Final   Culture   Final    NO GROWTH 3 DAYS Performed at [facility], [address], [place], [place] [address]    Report Status PENDING  Incomplete      Radiology Studies: CT HEAD WO CONTRAST (5MM)  Result Date: 03/22/2022 CLINICAL DATA:  Neuro deficit, generalized weakness and fever EXAM: CT HEAD WITHOUT CONTRAST TECHNIQUE: Contiguous axial images were obtained from the base of the skull through the vertex without intravenous contrast. RADIATION DOSE REDUCTION: This exam was performed according to the departmental dose-optimization program which includes automated exposure control, adjustment of the mA and/or kV according to patient size and/or use of iterative reconstruction technique. COMPARISON:  CT head and MRI brain May 23, 2014 FINDINGS: Brain: Mild age related global  parenchymal volume loss. Scattered and more confluent areas of white matter hypodensity nonspecific but most commonly reflecting chronic ischemic white matter disease. No evidence of large acute infarction, hemorrhage, hydrocephalus, extra-axial collection or mass lesion/mass effect. Dense dural calcifications. Vascular: No hyperdense vessel. Atherosclerotic calcifications of the internal carotid arteries at the skull base. Skull: Hyperostosis frontalis. Negative for fracture or focal lesion. Sinuses/Orbits: Opacification of the right frontal sinus with pneumatized partial opacification of the left frontal sinus. The remainder of the visualized paranasal sinuses are predominantly clear. Orbits are grossly unremarkable. Other: Mastoid air cells are predominantly clear. Probable dense cerumen in the bilateral external auditory canals. IMPRESSION: No acute intracranial abnormality. Mild age-related global parenchymal volume loss and white matter changes of chronic ischemic small vessel disease. Frontal paranasal sinus disease. Electronically Signed   By: Dahlia Bailiff M.D.   On: 03/22/2022 17:31    Scheduled Meds:  allopurinol  200 mg Oral Daily   pantoprazole  40 mg Intravenous Q12H   polyethylene glycol-electrolytes  4,000 mL Oral Once   Continuous Infusions:  sodium chloride 75 mL/hr at 03/24/22 1049   ceFEPime (MAXIPIME) IV 2 g (03/23/22 2202)     LOS: 2 days   Darliss Cheney, MD [facility]  03/24/2022, 12:05 PM   *Please note that this is a verbal dictation therefore  any spelling or grammatical errors are due to the "[employer]" system interpretation.  Please page via [place] and do not message via secure chat for urgent patient care matters. Secure chat can be used for non urgent patient care matters.  How to contact the [facility] Attending or Consulting provider [place] or covering provider during after hours [place], for this patient?  Check the care team in [facility] and look for a)  attending/consulting [facility] provider listed and b) the [facility] team listed. Page or secure chat [phone]. Log into [email or web address] and use [place]'s universal password to access. If you do not have the password, please contact the hospital operator. Locate the [facility] provider you are looking for under [facility] and page to a number that you can be directly reached. If you still have difficulty reaching the provider, please page the [facility] (Director on Call) for the Hospitalists listed on amion for assistance.

## 2022-03-24 NOTE — Progress Notes (Signed)
Pharmacy Antibiotic Note  Faith Lee is a 88 y.o. female admitted on 03/21/2022 with sepsis.  Pharmacy has been consulted for Vancomycin/Cefepime dosing. WBC 10.3. Noted renal dysfunction. Active chemo for mantle cell lymphoma. Cultures all negative to date but continued new onset confusion per patient's daughter in provider note.   Plan: Vancomycin 750 mg IV q48h >>>Estimated AUC: 480 Cefepime 2g IV q24h Trend WBC, temp, renal function  F/U infectious work-up Drug levels as indicated    Height: '5\' 4"'$  (162.6 cm) Weight: 64.8 kg (142 lb 13.7 oz) IBW/kg (Calculated) : 54.7  Temp (24hrs), Avg:98.1 F (36.7 C), Min:97.5 F (36.4 C), Max:98.7 F (37.1 C)  Recent Labs  Lab 03/18/22 0759 03/21/22 2206 03/22/22 0716 03/23/22 0423 03/24/22 0440  WBC 22.4* 12.5* 14.3* 9.7 10.3  CREATININE 2.30* 2.27* 2.06* 2.29* 2.43*  LATICACIDVEN  --  1.8  --   --   --      Estimated Creatinine Clearance: 13.8 mL/min (A) (by C-G formula based on SCr of 2.43 mg/dL (H)).    Allergies  Allergen Reactions   Lidocaine Other (See Comments) and Hypertension    Patient had chest tightness with stomach pain, numbness on left side of limbs and unable to speak  PT received lidocaine 03-10-22 for port insertion with no reaction   Rituximab-Pvvr Other (See Comments)    Patient complained of chest pressure/pain    Thomasenia Sales, PharmD, [facility] Clinical Pharmacist

## 2022-03-24 NOTE — Progress Notes (Signed)
Subjective: Patient reports that she began having diarrhea this morning, daughter Harle Battiest at bedside reports approx 3 episodes of diarrhea. Patient denies any abdominal pain. Feels that stools are very watery.   Objective: Vital signs in last 24 hours: Temp:  [97.5 F (36.4 C)-98.7 F (37.1 C)] 98.2 F (36.8 C) (06/07 0340) Pulse Rate:  [98-101] 99 (06/07 0340) Resp:  [16-18] 18 (06/07 0340) BP: (121-124)/(53-57) 121/55 (06/07 0340) SpO2:  [97 %-98 %] 98 % (06/07 0340) Last BM Date :  (unknown) General:   Alert and oriented, pleasant Head:  Normocephalic and atraumatic. Eyes:  No icterus, sclera clear. Conjuctiva pink.  Mouth:  Without lesions, mucosa pink and moist.  Heart:  S1, S2 present, no murmurs noted.  Lungs: Clear to auscultation bilaterally, without wheezing, rales, or rhonchi.  Abdomen:  Bowel sounds present, soft, non-tender, non-distended. No HSM or hernias noted. No rebound or guarding. No masses appreciated  Msk:  Symmetrical without gross deformities. Normal posture. Pulses:  Normal pulses noted. Extremities:  Without clubbing or edema. Neurologic:  Alert and  oriented x4;  grossly normal neurologically. Skin:  Warm and dry, intact without significant lesions.  Psych:  Alert and cooperative. Normal mood and affect.  Intake/Output from previous day: 06/06 0701 - 06/07 0700 In: 2450.4 [P.O.:840; I.V.:1334.2; IV Piggyback:276.2] Out: 700 [Urine:700] Intake/Output this shift: No intake/output data recorded.  Lab Results: Recent Labs    03/22/22 0716 03/23/22 0423 03/24/22 0440  WBC 14.3* 9.7 10.3  HGB 10.0* 8.5* 8.5*  HCT 32.1* 27.2* 27.4*  PLT 91* 82* 79*   BMET Recent Labs    03/22/22 0716 03/23/22 0423 03/24/22 0440  NA 142 146* 145  K 4.7 4.6 4.4  CL 114* 114* 116*  CO2 23 23 21*  GLUCOSE 96 83 91  BUN 61* 70* 72*  CREATININE 2.06* 2.29* 2.43*  CALCIUM 7.5* 7.7* 7.6*   LFT Recent Labs    03/22/22 0716 03/22/22 1532 03/23/22 0423  03/24/22 0440  PROT 5.1*  --  4.4* 4.4*  ALBUMIN 3.1*  --  2.7* 2.5*  AST 39  --  44* 47*  ALT 11  --  12 12  ALKPHOS 35*  --  33* 36*  BILITOT 2.0* 1.8* 1.6* 1.3*  BILIDIR  --  0.7*  --   --   IBILI  --  1.1*  --   --    PT/INR Recent Labs    03/21/22 2206  LABPROT 16.9*  INR 1.4*   Studies/Results: CT HEAD WO CONTRAST (5MM)  Result Date: 03/22/2022 CLINICAL DATA:  Neuro deficit, generalized weakness and fever EXAM: CT HEAD WITHOUT CONTRAST TECHNIQUE: Contiguous axial images were obtained from the base of the skull through the vertex without intravenous contrast. RADIATION DOSE REDUCTION: This exam was performed according to the departmental dose-optimization program which includes automated exposure control, adjustment of the mA and/or kV according to patient size and/or use of iterative reconstruction technique. COMPARISON:  CT head and MRI brain May 23, 2014 FINDINGS: Brain: Mild age related global parenchymal volume loss. Scattered and more confluent areas of white matter hypodensity nonspecific but most commonly reflecting chronic ischemic white matter disease. No evidence of large acute infarction, hemorrhage, hydrocephalus, extra-axial collection or mass lesion/mass effect. Dense dural calcifications. Vascular: No hyperdense vessel. Atherosclerotic calcifications of the internal carotid arteries at the skull base. Skull: Hyperostosis frontalis. Negative for fracture or focal lesion. Sinuses/Orbits: Opacification of the right frontal sinus with pneumatized partial opacification of the left frontal sinus. The remainder  of the visualized paranasal sinuses are predominantly clear. Orbits are grossly unremarkable. Other: Mastoid air cells are predominantly clear. Probable dense cerumen in the bilateral external auditory canals. IMPRESSION: No acute intracranial abnormality. Mild age-related global parenchymal volume loss and white matter changes of chronic ischemic small vessel disease.  Frontal paranasal sinus disease. Electronically Signed   By: Dahlia Bailiff M.D.   On: 03/22/2022 17:31    Assessment: DAYJA LOVERIDGE is an 88 y.o. 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 Second cycle was due on 6/1, but this was held per Oncology. Presented to the ED on 6/4 from the [facility] with generalized weakness, fever, and concern for sepsis of unknown origin. GI consulted due to acute on chronic anemia and heme positive stool.  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 S/p 2 units PRBCs with improvement of hgb to 10 on Monday (query if this was an incorrect reading as 6.9 to 10 is a large jump with only 2 units transfused) she has remained at 8.5 for the past 2 days, no overt GI bleeding reported. Patient will need EGD and Colonoscopy for further evaluation, will start prep today with plans for procedures tomorrow as patient is now A/ox4. Patient and daughter at bedside remain in agreement to proceed with EGD and Colonoscopy, as previously discussed yesterday.   Elevated Bilirubin: bilirubin 2 on admission, continuing to trend down, 1.3 today, LFTs  with slight bump in AST to 47 today, ALT WNL and ALk PHos low at 36. Suspect some component of elevation secondary to acute illness.   Acute encephalopathy: likely secondary to SIRS, improved. Patient is alert and oriented to person, place, time and situation.    Diarrhea: new onset, patient reports 3 episodes so far this morning, per nurse, patient also had 3 episodes over night. Will check stool studies to rule out infectious etiology, though diarrhea could be secondary to disturbance of gut flora from recent antibiotic administration. Will start daily probiotic. Will also order flexi seal in order  to help with diarrhea/colon prep management, as patient is not very mobile   Plan: Trend H&H, transfuse for hgb <7 EGD/Colonoscopy tomorrow Clear liquids, NPO at midnight PPI IV BID Trend LFTs/Bilirubin daily Monitor for overt GI bleeding GI pathogen panel C diff testing Daily probiotic  Flexi seal rectal tube   LOS: 2 days    03/24/2022, 9:08 AM  Nicolemarie Wooley L. Alver Sorrow, MSN, APRN, AGNP-C Adult-Gerontology Nurse Practitioner [facility]

## 2022-03-25 ENCOUNTER — Inpatient Hospital Stay (HOSPITAL_COMMUNITY): Payer: Medicare Other | Admitting: Anesthesiology

## 2022-03-25 ENCOUNTER — Inpatient Hospital Stay (HOSPITAL_COMMUNITY): Payer: Medicare Other

## 2022-03-25 ENCOUNTER — Encounter (HOSPITAL_COMMUNITY)
Admission: EM | Disposition: A | Discharge status: Skilled Nursing Facility | Payer: Self-pay | Source: Skilled Nursing Facility | Attending: Family Medicine

## 2022-03-25 ENCOUNTER — Encounter (HOSPITAL_COMMUNITY): Payer: Self-pay | Admitting: Internal Medicine

## 2022-03-25 DIAGNOSIS — K2970 Gastritis, unspecified, without bleeding: Secondary | ICD-10-CM

## 2022-03-25 DIAGNOSIS — D509 Iron deficiency anemia, unspecified: Secondary | ICD-10-CM

## 2022-03-25 DIAGNOSIS — A419 Sepsis, unspecified organism: Secondary | ICD-10-CM

## 2022-03-25 DIAGNOSIS — K5730 Diverticulosis of large intestine without perforation or abscess without bleeding: Secondary | ICD-10-CM

## 2022-03-25 DIAGNOSIS — R6510 Systemic inflammatory response syndrome (SIRS) of non-infectious origin without acute organ dysfunction: Secondary | ICD-10-CM

## 2022-03-25 DIAGNOSIS — B3781 Candidal esophagitis: Secondary | ICD-10-CM | POA: Diagnosis not present

## 2022-03-25 DIAGNOSIS — K648 Other hemorrhoids: Secondary | ICD-10-CM | POA: Diagnosis not present

## 2022-03-25 HISTORY — PX: ESOPHAGEAL BRUSHING: SHX6842

## 2022-03-25 HISTORY — PX: ESOPHAGOGASTRODUODENOSCOPY (EGD) WITH PROPOFOL: SHX5813

## 2022-03-25 HISTORY — PX: BIOPSY: SHX5522

## 2022-03-25 HISTORY — PX: COLONOSCOPY WITH PROPOFOL: SHX5780

## 2022-03-25 LAB — CBC WITH DIFFERENTIAL/PLATELET
Band Neutrophils: 5 %
Basophils Absolute: 0 10*3/uL (ref 0.0–0.1)
Basophils Relative: 0 %
Blasts: 7 %
Eosinophils Absolute: 0.6 10*3/uL — ABNORMAL HIGH (ref 0.0–0.5)
Eosinophils Relative: 4 %
HCT: 26.2 % — ABNORMAL LOW (ref 36.0–46.0)
Hemoglobin: 8 g/dL — ABNORMAL LOW (ref 12.0–15.0)
Lymphocytes Relative: 58 %
Lymphs Abs: 8.8 10*3/uL — ABNORMAL HIGH (ref 0.7–4.0)
MCH: 33.1 pg (ref 26.0–34.0)
MCHC: 30.5 g/dL (ref 30.0–36.0)
MCV: 108.3 fL — ABNORMAL HIGH (ref 80.0–100.0)
Monocytes Absolute: 1.1 10*3/uL — ABNORMAL HIGH (ref 0.1–1.0)
Monocytes Relative: 7 %
Myelocytes: 1 %
Neutro Abs: 3.5 10*3/uL (ref 1.7–7.7)
Neutrophils Relative %: 18 %
Platelets: 77 10*3/uL — ABNORMAL LOW (ref 150–400)
RBC: 2.42 MIL/uL — ABNORMAL LOW (ref 3.87–5.11)
RDW: 18.9 % — ABNORMAL HIGH (ref 11.5–15.5)
WBC: 15.1 10*3/uL — ABNORMAL HIGH (ref 4.0–10.5)
nRBC: 0.1 % (ref 0.0–0.2)

## 2022-03-25 LAB — COMPREHENSIVE METABOLIC PANEL
ALT: 13 U/L (ref 0–44)
AST: 43 U/L — ABNORMAL HIGH (ref 15–41)
Albumin: 2.5 g/dL — ABNORMAL LOW (ref 3.5–5.0)
Alkaline Phosphatase: 31 U/L — ABNORMAL LOW (ref 38–126)
Anion gap: 9 (ref 5–15)
BUN: 77 mg/dL — ABNORMAL HIGH (ref 8–23)
CO2: 20 mmol/L — ABNORMAL LOW (ref 22–32)
Calcium: 7.7 mg/dL — ABNORMAL LOW (ref 8.9–10.3)
Chloride: 118 mmol/L — ABNORMAL HIGH (ref 98–111)
Creatinine, Ser: 2.85 mg/dL — ABNORMAL HIGH (ref 0.44–1.00)
GFR, Estimated: 15 mL/min — ABNORMAL LOW (ref >60)
Glucose, Bld: 105 mg/dL — ABNORMAL HIGH (ref 70–99)
Potassium: 4 mmol/L (ref 3.5–5.1)
Sodium: 147 mmol/L — ABNORMAL HIGH (ref 135–145)
Total Bilirubin: 1.4 mg/dL — ABNORMAL HIGH (ref 0.3–1.2)
Total Protein: 4.1 g/dL — ABNORMAL LOW (ref 6.5–8.1)

## 2022-03-25 LAB — KOH PREP

## 2022-03-25 SURGERY — ESOPHAGOGASTRODUODENOSCOPY (EGD) WITH PROPOFOL
Anesthesia: General

## 2022-03-25 MED ORDER — PROPOFOL 10 MG/ML IV BOLUS
INTRAVENOUS | Status: DC | PRN
  Administered 2022-03-25: 50 mg via INTRAVENOUS

## 2022-03-25 MED ORDER — LACTATED RINGERS IV SOLN: INTRAVENOUS | Status: DC | PRN

## 2022-03-25 MED ORDER — PHENYLEPHRINE HCL (PRESSORS) 10 MG/ML IV SOLN
INTRAVENOUS | Status: DC | PRN
  Administered 2022-03-25 (×2): 100 ug via INTRAVENOUS

## 2022-03-25 MED ORDER — ETOMIDATE 2 MG/ML IV SOLN
INTRAVENOUS | Status: DC | PRN
  Administered 2022-03-25: 6 mg via INTRAVENOUS

## 2022-03-25 MED ORDER — PROPOFOL 500 MG/50ML IV EMUL
INTRAVENOUS | Status: DC | PRN
  Administered 2022-03-25: 120 ug/kg/min via INTRAVENOUS

## 2022-03-25 MED ORDER — CHLORHEXIDINE GLUCONATE CLOTH 2 % EX PADS
6.0000 | MEDICATED_PAD | Freq: Every day | CUTANEOUS | Status: DC
  Administered 2022-03-25 – 2022-03-28 (×4): 6 via TOPICAL

## 2022-03-25 MED ORDER — SODIUM CHLORIDE 0.9 % IV SOLN
1.0000 g | INTRAVENOUS | Status: DC
  Administered 2022-03-26 – 2022-03-27 (×2): 1 g via INTRAVENOUS
  Filled 2022-03-25 (×2): qty 10

## 2022-03-25 MED ORDER — SODIUM CHLORIDE FLUSH 0.9 % IV SOLN
INTRAVENOUS | Status: AC
  Filled 2022-03-25: qty 10

## 2022-03-25 NOTE — Op Note (Signed)
[facility] Patient Name: Faith Lee Procedure Date: 03/25/2022 12:36 PM MRN: [id number] Date of Birth: 05-25-1931 Attending MD: Elon Alas. Edgar Frisk CSN: [id number] Age: 88 Admit Type: Inpatient Procedure:                Upper GI endoscopy Indications:              Iron deficiency anemia, Heme positive stool Providers:                Elon Alas. Abbey Chatters, DO, Charlsie Quest. Insurance claims handler, Therapist, sports,                            Suzan Garibaldi. Risa Grill, Technician Referring MD:              Medicines:                See the Anesthesia note for documentation of the                            administered medications Complications:            No immediate complications. Estimated Blood Loss:     Estimated blood loss was minimal. Procedure:                Pre-Anesthesia Assessment:                           - The anesthesia plan was to use monitored                            anesthesia care (MAC).                           After obtaining informed consent, the endoscope was                            passed under direct vision. Throughout the                            procedure, the patient's blood pressure, pulse, and                            oxygen saturations were monitored continuously. The                            [id number]-H190 ([id number]) scope was introduced through the                            mouth, and advanced to the second part of duodenum.                            The upper GI endoscopy was accomplished without                            difficulty. The patient tolerated the procedure  well. Scope In: 1:01:26 PM Scope Out: 1:05:21 PM Total Procedure Duration: 0 hours 3 minutes 55 seconds  Findings:      Esophagitis with no bleeding was found in the entire esophagus. Cells       for cytology were obtained by brushing.      Diffuse moderate inflammation characterized by erythema was found in the       entire examined stomach. Biopsies were taken with a  cold forceps for       Helicobacter pylori testing.      The duodenal bulb, first portion of the duodenum and second portion of       the duodenum were normal. Impression:               - Candidiasis esophagitis with no bleeding. Cells                            for cytology obtained.                           - Gastritis. Biopsied.                           - Normal duodenal bulb, first portion of the                            duodenum and second portion of the duodenum. Moderate Sedation:      Per Anesthesia Care Recommendation:           - Use a proton pump inhibitor PO BID.                           - Treat for candidal esophagitis if cytiology                            positive.                           - Proceed with colonoscopy Procedure Code(s):        --- Professional ---                           [phone], Esophagogastroduodenoscopy, flexible,                            transoral; with biopsy, single or multiple Diagnosis Code(s):        --- Professional ---                           B37.81, Candidal esophagitis                           K29.70, Gastritis, unspecified, without bleeding                           D50.9, Iron deficiency anemia, unspecified                           R19.5, Other fecal abnormalities CPT copyright 2019 American Medical Association. All  rights reserved. The codes documented in this report are preliminary and upon coder review may  be revised to meet current compliance requirements. Elon Alas. Abbey Chatters, DO [place] Abbey Chatters, DO 03/25/2022 1:09:08 PM This report has been signed electronically. Number of Addenda: 0

## 2022-03-25 NOTE — Interval H&P Note (Signed)
History and Physical Interval Note:  03/25/2022 12:48 PM  Faith Lee  has presented today for surgery, with the diagnosis of anemia.  The various methods of treatment have been discussed with the patient and family. After consideration of risks, benefits and other options for treatment, the patient has consented to  Procedure(s): ESOPHAGOGASTRODUODENOSCOPY (EGD) WITH PROPOFOL (N/A) COLONOSCOPY WITH PROPOFOL (N/A) as a surgical intervention.  The patient's history has been reviewed, patient examined, no change in status, stable for surgery.  I have reviewed the patient's chart and labs.  Questions were answered to the patient's satisfaction.     [name]

## 2022-03-25 NOTE — Anesthesia Preprocedure Evaluation (Signed)
Anesthesia Evaluation  Patient identified by MRN, date of birth, ID band Patient confused    Reviewed: Allergy & Precautions, H&P , NPO status , Patient's Chart, lab work & pertinent test results, reviewed documented beta blocker date and time   Airway Mallampati: II  TM Distance: >3 FB Neck ROM: full    Dental no notable dental hx.    Pulmonary neg pulmonary ROS,    Pulmonary exam normal breath sounds clear to auscultation       Cardiovascular Exercise Tolerance: Good hypertension, negative cardio ROS   Rhythm:regular Rate:Normal     Neuro/Psych PSYCHIATRIC DISORDERS Anxiety Depression Dementia negative neurological ROS     GI/Hepatic negative GI ROS, Neg liver ROS,   Endo/Other  negative endocrine ROS  Renal/GU CRFRenal disease  negative genitourinary   Musculoskeletal   Abdominal   Peds  Hematology  (+) Blood dyscrasia, anemia ,   Anesthesia Other Findings   Reproductive/Obstetrics negative OB ROS                             Anesthesia Physical Anesthesia Plan  ASA: 3 and emergent  Anesthesia Plan: General   Post-op Pain Management:    Induction:   PONV Risk Score and Plan: Propofol infusion  Airway Management Planned:   Additional Equipment:   Intra-op Plan:   Post-operative Plan:   Informed Consent: I have reviewed the patients History and Physical, chart, labs and discussed the procedure including the risks, benefits and alternatives for the proposed anesthesia with the patient or authorized representative who has indicated his/her understanding and acceptance.     Dental Advisory Given  Plan Discussed with: CRNA  Anesthesia Plan Comments:         Anesthesia Quick Evaluation

## 2022-03-25 NOTE — Op Note (Signed)
[facility] Patient Name: Faith Lee Procedure Date: 03/25/2022 1:09 PM MRN: [id number] Date of Birth: 04-23-1931 Attending MD: Elon Alas. Edgar Frisk CSN: [id number] Age: 88 Admit Type: Inpatient Procedure:                Colonoscopy Indications:              Heme positive stool, Iron deficiency anemia Providers:                Elon Alas. Abbey Chatters, DO, Charlsie Quest. Theda Sers RN, RN,                            Janeece Riggers, RN, Kristine L. Risa Grill, Technician Referring MD:              Medicines:                See the Anesthesia note for documentation of the                            administered medications Complications:            No immediate complications. Estimated Blood Loss:     Estimated blood loss: none. Procedure:                Pre-Anesthesia Assessment:                           - The anesthesia plan was to use monitored                            anesthesia care (MAC).                           After obtaining informed consent, the colonoscope                            was passed under direct vision. Throughout the                            procedure, the patient's blood pressure, pulse, and                            oxygen saturations were monitored continuously. The                            [id number]-HQ190L ([id number]) scope was introduced through                            the anus and advanced to the the cecum, identified                            by appendiceal orifice and ileocecal valve. The                            colonoscopy was performed without difficulty. The  patient tolerated the procedure well. The quality                            of the bowel preparation was evaluated using the                            BBPS [facility] Bowel Preparation Scale) with scores                            of: Right Colon = 2 (minor amount of residual                            staining, small fragments of stool and/or opaque                             liquid, but mucosa seen well), Transverse Colon = 2                            (minor amount of residual staining, small fragments                            of stool and/or opaque liquid, but mucosa seen                            well) and Left Colon = 2 (minor amount of residual                            staining, small fragments of stool and/or opaque                            liquid, but mucosa seen well). The total BBPS score                            equals 6. The quality of the bowel preparation was                            fair. Scope In: 1:11:15 PM Scope Out: 1:25:03 PM Scope Withdrawal Time: 0 hours 6 minutes 4 seconds  Total Procedure Duration: 0 hours 13 minutes 48 seconds  Findings:      The perianal and digital rectal examinations were normal.      Non-bleeding internal hemorrhoids were found during endoscopy.      Multiple small and large-mouthed diverticula were found in the sigmoid       colon and descending colon.      There is no endoscopic evidence of bleeding or stigmata of bleeding in       the entire colon. Impression:               - Preparation of the colon was fair.                           - Non-bleeding internal hemorrhoids.                           -  Diverticulosis in the sigmoid colon and in the                            descending colon.                           - No specimens collected. Moderate Sedation:      Per Anesthesia Care Recommendation:           - Return patient to hospital ward for ongoing care.                           - Resume regular diet. Procedure Code(s):        --- Professional ---                           [phone], Colonoscopy, flexible; diagnostic, including                            collection of specimen(s) by brushing or washing,                            when performed (separate procedure) Diagnosis Code(s):        --- Professional ---                           K64.8, Other hemorrhoids                            R19.5, Other fecal abnormalities                           D50.9, Iron deficiency anemia, unspecified                           K57.30, Diverticulosis of large intestine without                            perforation or abscess without bleeding CPT copyright 2019 American Medical Association. All rights reserved. The codes documented in this report are preliminary and upon coder review may  be revised to meet current compliance requirements. Elon Alas. Abbey Chatters, DO [place] Abbey Chatters, DO 03/25/2022 1:29:26 PM This report has been signed electronically. Number of Addenda: 0

## 2022-03-25 NOTE — Progress Notes (Signed)
PROGRESS NOTE    Faith Lee  KDT:[id number] DOB: [birth year] DOA: 03/21/2022 PCP: Gerlene Fee, NP   Brief Narrative:  HPI: Faith Lee is a 88 y.o. female with medical history significant of stage IV mantle cell lymphoma who presents to the emergency department via EMS with generalized weakness and fever.  At bedside, patient was somnolent, though arousable, but was unable to provide any history, history was obtained from ED physician and ED medical record.  Per report, patient had recent chemotherapy, she denies chest pain, shortness of breath, cough or headache or abdominal pain or nausea, vomiting or diarrhea.   ED Course:  In the emergency department, patient was tachycardic, tachypneic, febrile with a temperature of 102.47F, BP was 111/44.  Work-up in the ED showed leukocytosis, H/H6.9/23.5 (this was 7.9/24.7 on 03/18/2022), platelets 105.  BUN/creatinine 67/2.27 (baseline creatinine at 1.2-1.5).  Fecal occult blood was positive.  Blood culture was pending. CT chest, abdomen and pelvis without contrast showed Diminished left pleural effusion from prior PET. No evidence of pneumonia. Patient was treated with Tylenol, IV vancomycin and cefepime, Flagyl was given, IV hydration was provided. Hospitalist was asked to admit patient for further evaluation and management.  Assessment & Plan:   Principal Problem:   SIRS (systemic inflammatory response syndrome) (HCC) Active Problems:   Mantle cell lymphoma (HCC)   GI bleed   Acute kidney injury superimposed on chronic kidney disease (HCC)   Thrombocytopenia (HCC)   Symptomatic anemia   Acute blood loss anemia   Essential hypertension   Heme positive stool   Pressure injury of skin   Diarrhea  Sepsis secondary to UTI, POA: Upon presentation, patient was febrile, tachycardic and tachypneic with leukocytosis.  Source of infection appears to be likely UTI.  Chest x-ray negative.  CT chest abdomen pelvis is negative for acute  pathology.  UA is cloudy with rare bacteria.  Talked to her daughter who told me that patient had seen urologist sometime in the beginning or middle of May and was diagnosed with UTI.  She was given full course of antibiotics.  She was then diagnosed with UTI once again at her nursing home and was given antibiotic but she broke out in rash so antibiotics were discontinued after 1-2 doses.  Patient has now improved.  She has remained afebrile for more than 2 days now.  Both blood as well as urine cultures are negative thus far.  I will further narrowing her antibiotics from cefepime to Rocephin today.  Acute on chronic blood loss anemia/GI bleed, likely upper: Patient's baseline hemoglobin appears to be around 7.7, patient was found to have 6.9, she received 2 units of PRBC transfusion.  FOBT was positive.  Posttransfusion hemoglobin over 8.  She is scheduled for EGD and colonoscopy today.  Acute toxic encephalopathy: Significant improvement compared to yesterday.  Patient fully alert and oriented and talkative.  CT head negative.  Patient is following all commands, no focal deficit.  Doubt stroke.  Will not pursue MRI.  This was likely secondary to sepsis.  Continue to treat underlying cause.  Stage IVb mantle cell lymphoma: Receiving chemotherapy Cycle 1 of Bendamustine and rituximab on 02/18/2022.  Bendamustine was dose reduced to 50 mg per metered square.  Second cycle was planned on 03/18/2022 however she was very weak with diarrhea so this was postponed.  AKI on CKD 3A: Patient's baseline creatinine around 1.2-1.5 with GFR around 38, presented with creatinine of 2.29, which is slowly climbing every day and  is 2.85 today.  We will obtain ultrasound renal and will insert Foley catheter.  If further worsening tomorrow, will consult nephrology.  Continue IV fluids.  Essential hypertension: Controlled, will continue metoprolol at lower dose of 12.5 mg twice daily.  TLS prophylaxis: Continue  allopurinol.  Bilateral lower extremity edema: Hold Lasix due to AKI.  DVT prophylaxis: SCDs Start: 03/22/22 0323, avoiding heparin products due to possible GI bleed.   Code Status: Full Code  Family Communication: None at bedside today.  Status is: Inpatient Remains inpatient appropriate because: Patient improving but needs EGD and colonoscopy.   Estimated body mass index is 24.52 kg/m as calculated from the following:   Height as of this encounter: '5\' 4"'$  (1.626 m).   Weight as of this encounter: 64.8 kg.  Pressure Injury 03/22/22 Coccyx Mid Stage 2 -  Partial thickness loss of dermis presenting as a shallow open injury with a red, pink wound bed without slough. 3 cm x 0.25 cm (Active)  03/22/22 0900  Location: Coccyx (cleft between buttocks)  Location Orientation: Mid  Staging: Stage 2 -  Partial thickness loss of dermis presenting as a shallow open injury with a red, pink wound bed without slough.  Wound Description (Comments): 3 cm x 0.25 cm  Present on Admission: Yes  Dressing Type Foam - Lift dressing to assess site every shift 03/23/22 1959   Nutritional Assessment: Body mass index is 24.52 kg/m.Marland Kitchen Seen by dietician.  I agree with the assessment and plan as outlined below: Nutrition Status:        . Skin Assessment: I have examined the patient's skin and I agree with the wound assessment as performed by the wound care RN as outlined below: Pressure Injury 03/22/22 Coccyx Mid Stage 2 -  Partial thickness loss of dermis presenting as a shallow open injury with a red, pink wound bed without slough. 3 cm x 0.25 cm (Active)  03/22/22 0900  Location: Coccyx (cleft between buttocks)  Location Orientation: Mid  Staging: Stage 2 -  Partial thickness loss of dermis presenting as a shallow open injury with a red, pink wound bed without slough.  Wound Description (Comments): 3 cm x 0.25 cm  Present on Admission: Yes  Dressing Type Foam - Lift dressing to assess site every shift  03/23/22 1959    Consultants:  GI  Procedures:  None  Antimicrobials:  Anti-infectives (From admission, onward)    Start     Dose/Rate Route Frequency Ordered Stop   03/23/22 2200  vancomycin (VANCOREADY) IVPB 750 mg/150 mL  Status:  Discontinued        750 mg 150 mL/hr over 60 Minutes Intravenous Every 48 hours 03/22/22 0024 03/24/22 0820   03/22/22 2200  ceFEPIme (MAXIPIME) 2 g in sodium chloride 0.9 % 100 mL IVPB        2 g 200 mL/hr over 30 Minutes Intravenous Every 24 hours 03/22/22 0013     03/21/22 2200  ceFEPIme (MAXIPIME) 2 g in sodium chloride 0.9 % 100 mL IVPB        2 g 200 mL/hr over 30 Minutes Intravenous  Once 03/21/22 2159 03/21/22 2256   03/21/22 2200  metroNIDAZOLE (FLAGYL) IVPB 500 mg        500 mg 100 mL/hr over 60 Minutes Intravenous  Once 03/21/22 2159 03/21/22 2328   03/21/22 2200  vancomycin (VANCOCIN) IVPB 1000 mg/200 mL premix        1,000 mg 200 mL/hr over 60 Minutes Intravenous  Once 03/21/22  2159 03/21/22 2328         Subjective: Patient seen and examined.  She is again fully alert and oriented.  She has no complaints.  Objective: Vitals:   03/24/22 0340 03/24/22 1512 03/24/22 2203 03/25/22 0512  BP: (!) 121/55 (!) 126/54 (!) 124/55 (!) 119/51  Pulse: 99 94 98 87  Resp: '18 17 19 18  '$ Temp: 98.2 F (36.8 C) 98.6 F (37 C) 98.8 F (37.1 C) 98.4 F (36.9 C)  TempSrc:      SpO2: 98% 97% 98% 100%  Weight:      Height:        Intake/Output Summary (Last 24 hours) at 03/25/2022 1149 Last data filed at 03/25/2022 1127 Gross per 24 hour  Intake 2005.23 ml  Output 1700 ml  Net 305.23 ml    Filed Weights   03/21/22 2102  Weight: 64.8 kg    Examination:  General exam: Appears calm and comfortable  Respiratory system: Clear to auscultation. Respiratory effort normal. Cardiovascular system: S1 & S2 heard, RRR. No JVD, murmurs, rubs, gallops or clicks. No pedal edema. Gastrointestinal system: Abdomen is nondistended, soft and nontender.  No organomegaly or masses felt. Normal bowel sounds heard. Central nervous system: Alert and oriented. No focal neurological deficits. Extremities: Symmetric 5 x 5 power. Skin: No rashes, lesions or ulcers.  Psychiatry: Judgement and insight appear poor  Data Reviewed: I have personally reviewed following labs and imaging studies  CBC: Recent Labs  Lab 03/21/22 2206 03/22/22 0716 03/23/22 0423 03/24/22 0440 03/25/22 0458  WBC 12.5* 14.3* 9.7 10.3 15.1*  NEUTROABS 2.3  --  2.0 1.9 3.5  HGB 6.9* 10.0* 8.5* 8.5* 8.0*  HCT 23.5* 32.1* 27.2* 27.4* 26.2*  MCV 112.4* 102.9* 103.0* 105.8* 108.3*  PLT 105* 91* 82* 79* 77*    Basic Metabolic Panel: Recent Labs  Lab 03/21/22 2206 03/22/22 0716 03/23/22 0423 03/24/22 0440 03/25/22 0458  NA 143 142 146* 145 147*  K 4.8 4.7 4.6 4.4 4.0  CL 112* 114* 114* 116* 118*  CO2 '24 23 23 '$ 21* 20*  GLUCOSE 106* 96 83 91 105*  BUN 67* 61* 70* 72* 77*  CREATININE 2.27* 2.06* 2.29* 2.43* 2.85*  CALCIUM 7.9* 7.5* 7.7* 7.6* 7.7*  MG  --  2.5*  --   --   --   PHOS  --  2.7  --   --   --     GFR: Estimated Creatinine Clearance: 11.8 mL/min (A) (by C-G formula based on SCr of 2.85 mg/dL (H)). Liver Function Tests: Recent Labs  Lab 03/21/22 2206 03/22/22 0716 03/22/22 1532 03/23/22 0423 03/24/22 0440 03/25/22 0458  AST 32 39  --  44* 47* 43*  ALT 13 11  --  '12 12 13  '$ ALKPHOS 38 35*  --  33* 36* 31*  BILITOT 0.9 2.0* 1.8* 1.6* 1.3* 1.4*  PROT 5.7* 5.1*  --  4.4* 4.4* 4.1*  ALBUMIN 3.4* 3.1*  --  2.7* 2.5* 2.5*    No results for input(s): "LIPASE", "AMYLASE" in the last 168 hours. Recent Labs  Lab 03/22/22 1656  AMMONIA 23    Coagulation Profile: Recent Labs  Lab 03/21/22 2206  INR 1.4*    Cardiac Enzymes: No results for input(s): "CKTOTAL", "CKMB", "CKMBINDEX", "TROPONINI" in the last 168 hours. BNP (last 3 results) No results for input(s): "PROBNP" in the last 8760 hours. HbA1C: No results for input(s): "HGBA1C" in the  last 72 hours. CBG: No results for input(s): "GLUCAP" in the  last 168 hours. Lipid Profile: No results for input(s): "CHOL", "HDL", "LDLCALC", "TRIG", "CHOLHDL", "LDLDIRECT" in the last 72 hours. Thyroid Function Tests: No results for input(s): "TSH", "T4TOTAL", "FREET4", "T3FREE", "THYROIDAB" in the last 72 hours. Anemia Panel: No results for input(s): "VITAMINB12", "FOLATE", "FERRITIN", "TIBC", "IRON", "RETICCTPCT" in the last 72 hours. Sepsis Labs: Recent Labs  Lab 03/21/22 2206 03/22/22 0716  PROCALCITON  --  0.94  LATICACIDVEN 1.8  --      Recent Results (from the past 240 hour(s))  Urine Culture     Status: None   Collection Time: 03/21/22  9:23 PM   Specimen: In/Out Cath Urine  Result Value Ref Range Status   Specimen Description   Final    IN/OUT CATH URINE Performed at [facility] [address] [place], [place] [address]    Special Requests   Final    NONE Performed at [facility], [address], [place], [place] [address]    Culture   Final    NO GROWTH Performed at [facility], [place] [address] [place], [place] [address]    Report Status 03/23/2022 FINAL  Final  Resp Panel by RT-PCR (Flu A&B, Covid) Urine, In & Out Cath     Status: None   Collection Time: 03/21/22  9:59 PM   Specimen: Urine, In & Out Cath; Nasal Swab  Result Value Ref Range Status   SARS Coronavirus 2 by RT PCR NEGATIVE NEGATIVE Final    Comment: (NOTE) SARS-CoV-2 target nucleic acids are NOT DETECTED.  The SARS-CoV-2 RNA is generally detectable in upper respiratory specimens during the acute phase of infection. The lowest concentration of SARS-CoV-2 viral copies this assay can detect is 138 copies/mL. A negative result does not preclude SARS-Cov-2 infection and should not be used as the sole basis for treatment or other patient management decisions. A negative result may occur with  improper specimen collection/handling, submission of specimen other than nasopharyngeal swab,  presence of viral mutation(s) within the areas targeted by this assay, and inadequate number of viral copies(<138 copies/mL). A negative result must be combined with clinical observations, patient history, and epidemiological information. The expected result is Negative.  Fact Sheet for Patients:  EntrepreneurPulse.com.au  Fact Sheet for Healthcare Providers:  IncredibleEmployment.be  This test is no t yet approved or cleared by the [place] FDA and  has been authorized for detection and/or diagnosis of SARS-CoV-2 by FDA under an Emergency Use Authorization (EUA). This EUA will remain  in effect (meaning this test can be used) for the duration of the COVID-19 declaration under Section 564(b)(1) of the Act, 21 U.S.C.section 360bbb-3(b)(1), unless the authorization is terminated  or revoked sooner.       Influenza A by PCR NEGATIVE NEGATIVE Final   Influenza B by PCR NEGATIVE NEGATIVE Final    Comment: (NOTE) The Xpert Xpress SARS-CoV-2/FLU/RSV plus assay is intended as an aid in the diagnosis of influenza from Nasopharyngeal swab specimens and should not be used as a sole basis for treatment. Nasal washings and aspirates are unacceptable for Xpert Xpress SARS-CoV-2/FLU/RSV testing.  Fact Sheet for Patients: EntrepreneurPulse.com.au  Fact Sheet for Healthcare Providers: IncredibleEmployment.be  This test is not yet approved or cleared by the [place] FDA and has been authorized for detection and/or diagnosis of SARS-CoV-2 by FDA under an Emergency Use Authorization (EUA). This EUA will remain in effect (meaning this test can be used) for the duration of the COVID-19 declaration under Section 564(b)(1) of the Act, 21  U.S.C. section 360bbb-3(b)(1), unless the authorization is terminated or revoked.  Performed at [facility], [address], [place], [place] [address]   Blood Culture (routine x  2)     Status: None (Preliminary result)   Collection Time: 03/21/22 10:07 PM   Specimen: Site Not Specified; Blood  Result Value Ref Range Status   Specimen Description   Final    SITE NOT SPECIFIED BOTTLES DRAWN AEROBIC AND ANAEROBIC   Special Requests Blood Culture adequate volume  Final   Culture   Final    NO GROWTH 4 DAYS Performed at [facility], [address] [place], [place] [address]    Report Status PENDING  Incomplete  Blood Culture (routine x 2)     Status: None (Preliminary result)   Collection Time: 03/21/22 10:11 PM   Specimen: Site Not Specified; Blood  Result Value Ref Range Status   Specimen Description   Final    SITE NOT SPECIFIED BOTTLES DRAWN AEROBIC AND ANAEROBIC   Special Requests Blood Culture adequate volume  Final   Culture   Final    NO GROWTH 4 DAYS Performed at [facility], [address], [place], [place] [address]    Report Status PENDING  Incomplete      Radiology Studies: US RENAL  Result Date: 03/25/2022 CLINICAL DATA:  Acute kidney injury EXAM: RENAL / URINARY TRACT ULTRASOUND COMPLETE COMPARISON:  None Available. FINDINGS: Right Kidney: Renal measurements: 11.8 x 4.8 x 4.8 cm = volume: 142 mL. Echogenicity within normal limits. No mass or hydronephrosis visualized. Left Kidney: Renal measurements: 11.3 x 4.6 x 5.6 cm = volume: 153 mL. Echogenicity within normal limits. Possible small solid mass of the inferior pole of the left kidney measuring 1.1 cm. Bladder: Appears normal for degree of bladder distention. Other: Incidental note of severe splenomegaly, measuring 21.2 x 8.7 x 18.0 cm, as well as splenic varices. Incidental note of cholelithiasis. IMPRESSION: 1. No hydronephrosis. 2. Possible small solid mass of the inferior pole of the left kidney measuring 1.1 cm. Renal protocol CT or MRI is recommended for further evaluation. 3. Incidental note of severe splenomegaly and splenic varices. 4. Incidental note of cholelithiasis. Electronically  Signed   By: Delanna Ahmadi M.D.   On: 03/25/2022 10:22    Scheduled Meds:  acidophilus  1 capsule Oral Daily   allopurinol  200 mg Oral Daily   Chlorhexidine Gluconate Cloth  6 each Topical Daily   metoprolol tartrate  12.5 mg Oral BID   pantoprazole  40 mg Intravenous Q12H   Continuous Infusions:  sodium chloride 75 mL/hr at 03/25/22 1127   sodium chloride     ceFEPime (MAXIPIME) IV 2 g (03/24/22 2203)     LOS: 3 days   Darliss Cheney, MD [facility]  03/25/2022, 11:49 AM   *Please note that this is a verbal dictation therefore any spelling or grammatical errors are due to the "[employer] One" system interpretation.  Please page via [place] and do not message via secure chat for urgent patient care matters. Secure chat can be used for non urgent patient care matters.  How to contact the [facility] Attending or Consulting provider [place] or covering provider during after hours [place], for this patient?  Check the care team in [facility] and look for a) attending/consulting [facility] provider listed and b) the [facility] team listed. Page or secure chat [phone]. Log into [email or web address] and use [place]'s universal password to access. If you do not have the  password, please contact the hospital operator. Locate the [facility] provider you are looking for under [facility] and page to a number that you can be directly reached. If you still have difficulty reaching the provider, please page the [facility] (Director on Call) for the Hospitalists listed on amion for assistance.

## 2022-03-25 NOTE — Transfer of Care (Signed)
Immediate Anesthesia Transfer of Care Note  Patient: Faith Lee  Procedure(s) Performed: ESOPHAGOGASTRODUODENOSCOPY (EGD) WITH PROPOFOL COLONOSCOPY WITH PROPOFOL ESOPHAGEAL BRUSHING BIOPSY  Patient Location: PACU  Anesthesia Type:General  Level of Consciousness: sedated and drowsy  Airway & Oxygen Therapy: Patient Spontanous Breathing  Post-op Assessment: Report given to RN, Post -op Vital signs reviewed and stable and Patient moving all extremities X 4  Post vital signs: Reviewed and stable  Last Vitals:  Vitals Value Taken Time  BP 123/55   Temp 98.2   Pulse 90   Resp 26   SpO2 95     Last Pain:  Vitals:   03/25/22 1230  TempSrc:   PainSc: 0-No pain         Complications: No notable events documented.

## 2022-03-26 ENCOUNTER — Encounter (HOSPITAL_COMMUNITY): Payer: Self-pay | Admitting: Internal Medicine

## 2022-03-26 DIAGNOSIS — B3781 Candidal esophagitis: Secondary | ICD-10-CM

## 2022-03-26 DIAGNOSIS — D649 Anemia, unspecified: Secondary | ICD-10-CM

## 2022-03-26 DIAGNOSIS — R195 Other fecal abnormalities: Secondary | ICD-10-CM

## 2022-03-26 DIAGNOSIS — R197 Diarrhea, unspecified: Secondary | ICD-10-CM

## 2022-03-26 DIAGNOSIS — K2970 Gastritis, unspecified, without bleeding: Secondary | ICD-10-CM

## 2022-03-26 DIAGNOSIS — R6510 Systemic inflammatory response syndrome (SIRS) of non-infectious origin without acute organ dysfunction: Secondary | ICD-10-CM | POA: Diagnosis not present

## 2022-03-26 LAB — CBC WITH DIFFERENTIAL/PLATELET
Abs Immature Granulocytes: 0 10*3/uL (ref 0.00–0.07)
Band Neutrophils: 8 %
Basophils Absolute: 0 10*3/uL (ref 0.0–0.1)
Basophils Relative: 0 %
Blasts: 7 %
Eosinophils Absolute: 0 10*3/uL (ref 0.0–0.5)
Eosinophils Relative: 0 %
HCT: 25.4 % — ABNORMAL LOW (ref 36.0–46.0)
Hemoglobin: 7.5 g/dL — ABNORMAL LOW (ref 12.0–15.0)
Lymphocytes Relative: 62 %
Lymphs Abs: 12.4 10*3/uL — ABNORMAL HIGH (ref 0.7–4.0)
MCH: 32.3 pg (ref 26.0–34.0)
MCHC: 29.5 g/dL — ABNORMAL LOW (ref 30.0–36.0)
MCV: 109.5 fL — ABNORMAL HIGH (ref 80.0–100.0)
Monocytes Absolute: 1 10*3/uL (ref 0.1–1.0)
Monocytes Relative: 5 %
Neutro Abs: 5.2 10*3/uL (ref 1.7–7.7)
Neutrophils Relative %: 18 %
Platelets: 83 10*3/uL — ABNORMAL LOW (ref 150–400)
RBC: 2.32 MIL/uL — ABNORMAL LOW (ref 3.87–5.11)
RDW: 18.5 % — ABNORMAL HIGH (ref 11.5–15.5)
WBC: 20 10*3/uL — ABNORMAL HIGH (ref 4.0–10.5)
nRBC: 0.1 % (ref 0.0–0.2)

## 2022-03-26 LAB — CULTURE, BLOOD (ROUTINE X 2)
Culture: NO GROWTH
Culture: NO GROWTH
Special Requests: ADEQUATE
Special Requests: ADEQUATE

## 2022-03-26 LAB — BASIC METABOLIC PANEL
Anion gap: 7 (ref 5–15)
BUN: 86 mg/dL — ABNORMAL HIGH (ref 8–23)
CO2: 18 mmol/L — ABNORMAL LOW (ref 22–32)
Calcium: 8 mg/dL — ABNORMAL LOW (ref 8.9–10.3)
Chloride: 122 mmol/L — ABNORMAL HIGH (ref 98–111)
Creatinine, Ser: 3.32 mg/dL — ABNORMAL HIGH (ref 0.44–1.00)
GFR, Estimated: 13 mL/min — ABNORMAL LOW (ref >60)
Glucose, Bld: 98 mg/dL (ref 70–99)
Potassium: 3.9 mmol/L (ref 3.5–5.1)
Sodium: 147 mmol/L — ABNORMAL HIGH (ref 135–145)

## 2022-03-26 LAB — HEPATIC FUNCTION PANEL
ALT: 15 U/L (ref 0–44)
AST: 41 U/L (ref 15–41)
Albumin: 2.6 g/dL — ABNORMAL LOW (ref 3.5–5.0)
Alkaline Phosphatase: 32 U/L — ABNORMAL LOW (ref 38–126)
Bilirubin, Direct: 0.3 mg/dL — ABNORMAL HIGH (ref 0.0–0.2)
Indirect Bilirubin: 0.8 mg/dL (ref 0.3–0.9)
Total Bilirubin: 1.1 mg/dL (ref 0.3–1.2)
Total Protein: 4.4 g/dL — ABNORMAL LOW (ref 6.5–8.1)

## 2022-03-26 MED ORDER — NYSTATIN 100000 UNIT/ML MT SUSP
5.0000 mL | Freq: Four times a day (QID) | OROMUCOSAL | Status: DC
Start: 2022-03-26 — End: 2022-03-28
  Administered 2022-03-26 – 2022-03-27 (×2): 500000 [IU] via ORAL
  Filled 2022-03-26 (×2): qty 5

## 2022-03-26 NOTE — Plan of Care (Signed)
  Problem: Acute Rehab PT Goals(only PT should resolve) Goal: Pt Will Go Supine/Side To Sit Outcome: Progressing Flowsheets (Taken 03/26/2022 1140) Pt will go Supine/Side to Sit: with moderate assist Goal: Pt Will Go Sit To Supine/Side Outcome: Progressing Flowsheets (Taken 03/26/2022 1140) Pt will go Sit to Supine/Side: with moderate assist  11:41 AM, 03/26/22 Mearl Latin PT, DPT Physical Therapist at [facility]

## 2022-03-26 NOTE — Care Management Important Message (Signed)
Important Message  Patient Details  Name: Faith Lee MRN: [id number] Date of Birth: Aug 18, 1931   Medicare Important Message Given:  Other (see comment)  On comfort measures.  Medicare IM withheld at this time out of respect for patient and family.   Dannette Barbara 03/26/2022, 1:43 PM

## 2022-03-26 NOTE — Plan of Care (Signed)
  Problem: Acute Rehab OT Goals (only OT should resolve) Goal: Pt. Will Perform Eating Flowsheets (Taken 03/26/2022 0945) Pt Will Perform Eating:  with set-up  sitting Goal: Pt. Will Perform Grooming Flowsheets (Taken 03/26/2022 0945) Pt Will Perform Grooming:  with set-up  sitting Goal: Pt. Will Perform Upper Body Dressing Flowsheets (Taken 03/26/2022 0945) Pt Will Perform Upper Body Dressing:  with mod assist  sitting Goal: Pt. Will Transfer To Toilet Flowsheets (Taken 03/26/2022 0945) Pt Will Transfer to Toilet:  with mod assist  squat pivot transfer   Arvil Persons, OTR/L

## 2022-03-26 NOTE — Progress Notes (Signed)
PROGRESS NOTE    Faith Lee  ELF:[id number] DOB: 1931-06-26 DOA: 03/21/2022 PCP: Gerlene Fee, NP   Brief Narrative:  HPI: Faith Lee is a 88 y.o. female with medical history significant of stage IV mantle cell lymphoma who presents to the emergency department via EMS with generalized weakness and fever.  At bedside, patient was somnolent, though arousable, but was unable to provide any history, history was obtained from ED physician and ED medical record.  Per report, patient had recent chemotherapy, she denies chest pain, shortness of breath, cough or headache or abdominal pain or nausea, vomiting or diarrhea.   ED Course:  In the emergency department, patient was tachycardic, tachypneic, febrile with a temperature of 102.19F, BP was 111/44.  Work-up in the ED showed leukocytosis, H/H6.9/23.5 (this was 7.9/24.7 on 03/18/2022), platelets 105.  BUN/creatinine 67/2.27 (baseline creatinine at 1.2-1.5).  Fecal occult blood was positive.  Blood culture was pending. CT chest, abdomen and pelvis without contrast showed Diminished left pleural effusion from prior PET. No evidence of pneumonia. Patient was treated with Tylenol, IV vancomycin and cefepime, Flagyl was given, IV hydration was provided. Hospitalist was asked to admit patient for further evaluation and management.  Assessment & Plan:   Principal Problem:   SIRS (systemic inflammatory response syndrome) (HCC) Active Problems:   Mantle cell lymphoma (HCC)   GI bleed   Acute kidney injury superimposed on chronic kidney disease (HCC)   Thrombocytopenia (HCC)   Symptomatic anemia   Acute blood loss anemia   Essential hypertension   Heme positive stool   Pressure injury of skin   Diarrhea   Sepsis ([place])   Candidal esophagitis (HCC)   Gastritis  Sepsis secondary to UTI, POA: Upon presentation, patient was febrile, tachycardic and tachypneic with leukocytosis.  Source of infection appears to be likely UTI.  Chest x-ray  negative.  CT chest abdomen pelvis is negative for acute pathology.  UA is cloudy with rare bacteria.  Talked to her daughter who told me that patient had seen urologist sometime in the beginning or middle of May and was diagnosed with UTI.  She was given full course of antibiotics.  She was then diagnosed with UTI once again at her nursing home and was given antibiotic but she broke out in rash so antibiotics were discontinued after 1-2 doses.  Patient has now improved.  She has remained afebrile for more than 3 days now.  Both blood as well as urine cultures are negative thus far.  Continue Rocephin to complete 7-day course.  Acute on chronic blood loss anemia/upper GI bleed/gastritis/candidal esophagitis: Patient's baseline hemoglobin appears to be around 7.7, patient was found to have 6.9, she received 2 units of PRBC transfusion.  FOBT was positive.  Posttransfusion hemoglobin over 8 but now drifting down, 7.5 today.  Underwent EGD and colonoscopy on 03/25/2022.  Colonoscopy showed only hemorrhoids and diverticulosis but no bleeding.  EGD showed candidal esophagitis, biopsies were sent, also showed nonbleeding gastritis.  Continue PPI.  Awaiting further plans from GI  Acute toxic encephalopathy: Significant improvement compared to yesterday.  Patient fully alert and oriented and talkative.  CT head negative.  No focal deficit.  Doubt stroke.  Will not pursue MRI.  This was likely secondary to sepsis.  Patient was slightly confused today but fully alert.  Stage IVb mantle cell lymphoma: Receiving chemotherapy Cycle 1 of Bendamustine and rituximab on 02/18/2022.  Bendamustine was dose reduced to 50 mg per metered square.  Second cycle was  planned on 03/18/2022 however she was very weak with diarrhea so this was postponed.  AKI on CKD 3A: Patient's baseline creatinine around 1.2-1.5 with GFR around 38, presented with creatinine of 2.29, which is slowly climbing every day and is 3.3 to today.  Foley inserted.   Ultrasound kidney shows solid mass in the left lower pole of the left kidney.  MRI or CT renal protocol is recommended.  Consulted nephrology who discussed with the daughter that patient at her age with multiple comorbidities is likely not a candidate for dialysis.  After discussion with the daughter, daughter has elected to pursue comfort care and no further aggressive management.  Essential hypertension: Controlled, will continue metoprolol at lower dose of 12.5 mg twice daily.  TLS prophylaxis: Continue allopurinol.  Bilateral lower extremity edema: Hold Lasix due to AKI.  GOC: During my encounter today, I had frank discussion with the daughter where I laid out the fact that patient is very old with cancer which is also having issues with the treatment and now she is sick with multiple issues and new renal mass which could be cancer.  After discussion, daughter agreed to switch her to DNR from full code.  However later on when nephrology discussed with her, daughter expressed her interest in transitioning to full comfort care.  Social worker also talked to the daughter and she once again confirmed.  I then personally talked to the daughter over the phone and she confirmed that she would like to pursue comfort care only.  I had consulted palliative care as well.  DVT prophylaxis: SCDs Start: 03/22/22 0323, avoiding heparin products due to possible GI bleed.   Code Status: Full Code  Family Communication: Daughter at bedside.  Status is: Inpatient Remains inpatient appropriate because: Patient improving but needs EGD and colonoscopy.   Estimated body mass index is 24.52 kg/m as calculated from the following:   Height as of this encounter: '5\' 4"'$  (1.626 m).   Weight as of this encounter: 64.8 kg.  Pressure Injury 03/22/22 Coccyx Mid Stage 2 -  Partial thickness loss of dermis presenting as a shallow open injury with a red, pink wound bed without slough. 3 cm x 0.25 cm (Active)  03/22/22 0900   Location: Coccyx (cleft between buttocks)  Location Orientation: Mid  Staging: Stage 2 -  Partial thickness loss of dermis presenting as a shallow open injury with a red, pink wound bed without slough.  Wound Description (Comments): 3 cm x 0.25 cm  Present on Admission: Yes  Dressing Type Foam - Lift dressing to assess site every shift 03/23/22 1959   Nutritional Assessment: Body mass index is 24.52 kg/m.Marland Kitchen Seen by dietician.  I agree with the assessment and plan as outlined below: Nutrition Status:        . Skin Assessment: I have examined the patient's skin and I agree with the wound assessment as performed by the wound care RN as outlined below: Pressure Injury 03/22/22 Coccyx Mid Stage 2 -  Partial thickness loss of dermis presenting as a shallow open injury with a red, pink wound bed without slough. 3 cm x 0.25 cm (Active)  03/22/22 0900  Location: Coccyx (cleft between buttocks)  Location Orientation: Mid  Staging: Stage 2 -  Partial thickness loss of dermis presenting as a shallow open injury with a red, pink wound bed without slough.  Wound Description (Comments): 3 cm x 0.25 cm  Present on Admission: Yes  Dressing Type Foam - Lift dressing to assess  site every shift 03/23/22 1959    Consultants:  GI Nephrology Palliative care Procedures:  EGD and colonoscopy 03/25/2022  Antimicrobials:  Anti-infectives (From admission, onward)    Start     Dose/Rate Route Frequency Ordered Stop   03/25/22 1245  cefTRIAXone (ROCEPHIN) 1 g in sodium chloride 0.9 % 100 mL IVPB        1 g 200 mL/hr over 30 Minutes Intravenous Every 24 hours 03/25/22 1151 03/29/22 1244   03/23/22 2200  vancomycin (VANCOREADY) IVPB 750 mg/150 mL  Status:  Discontinued        750 mg 150 mL/hr over 60 Minutes Intravenous Every 48 hours 03/22/22 0024 03/24/22 0820   03/22/22 2200  ceFEPIme (MAXIPIME) 2 g in sodium chloride 0.9 % 100 mL IVPB  Status:  Discontinued        2 g 200 mL/hr over 30 Minutes  Intravenous Every 24 hours 03/22/22 0013 03/25/22 1151   03/21/22 2200  ceFEPIme (MAXIPIME) 2 g in sodium chloride 0.9 % 100 mL IVPB        2 g 200 mL/hr over 30 Minutes Intravenous  Once 03/21/22 2159 03/21/22 2256   03/21/22 2200  metroNIDAZOLE (FLAGYL) IVPB 500 mg        500 mg 100 mL/hr over 60 Minutes Intravenous  Once 03/21/22 2159 03/21/22 2328   03/21/22 2200  vancomycin (VANCOCIN) IVPB 1000 mg/200 mL premix        1,000 mg 200 mL/hr over 60 Minutes Intravenous  Once 03/21/22 2159 03/21/22 2328         Subjective:  Patient seen and examined.  Daughter at the bedside.  Patient was fully alert but partly oriented today but she remembers everything after prompts.  No new complaint.  Objective: Vitals:   03/25/22 1635 03/25/22 2207 03/25/22 2207 03/26/22 0532  BP: (!) 130/94 (!) 112/97 (!) 112/97 (!) 135/46  Pulse: (!) 102 87 87 78  Resp:   20 19  Temp:   98.4 F (36.9 C) 98.5 F (36.9 C)  TempSrc:   Oral   SpO2:   99% 100%  Weight:      Height:        Intake/Output Summary (Last 24 hours) at 03/26/2022 0823 Last data filed at 03/26/2022 0725 Gross per 24 hour  Intake 2512.92 ml  Output 1000 ml  Net 1512.92 ml   Filed Weights   03/21/22 2102  Weight: 64.8 kg    Examination:  General exam: Appears calm and comfortable  Respiratory system: Clear to auscultation. Respiratory effort normal. Cardiovascular system: S1 & S2 heard, RRR. No JVD, murmurs, rubs, gallops or clicks. No pedal edema. Gastrointestinal system: Abdomen is nondistended, soft and nontender. No organomegaly or masses felt. Normal bowel sounds heard. Central nervous system: Alert and oriented x2. No focal neurological deficits. Extremities: Symmetric 5 x 5 power. Skin: No rashes, lesions or ulcers.  Psychiatry: Judgement and insight appear poor  Data Reviewed: I have personally reviewed following labs and imaging studies  CBC: Recent Labs  Lab 03/21/22 2206 03/22/22 0716 03/23/22 0423  03/24/22 0440 03/25/22 0458 03/26/22 0433  WBC 12.5* 14.3* 9.7 10.3 15.1* 20.0*  NEUTROABS 2.3  --  2.0 1.9 3.5 5.2  HGB 6.9* 10.0* 8.5* 8.5* 8.0* 7.5*  HCT 23.5* 32.1* 27.2* 27.4* 26.2* 25.4*  MCV 112.4* 102.9* 103.0* 105.8* 108.3* 109.5*  PLT 105* 91* 82* 79* 77* 83*   Basic Metabolic Panel: Recent Labs  Lab 03/22/22 0716 03/23/22 0423 03/24/22 0440 03/25/22 0458 03/26/22  0433  NA 142 146* 145 147* 147*  K 4.7 4.6 4.4 4.0 3.9  CL 114* 114* 116* 118* 122*  CO2 23 23 21* 20* 18*  GLUCOSE 96 83 91 105* 98  BUN 61* 70* 72* 77* 86*  CREATININE 2.06* 2.29* 2.43* 2.85* 3.32*  CALCIUM 7.5* 7.7* 7.6* 7.7* 8.0*  MG 2.5*  --   --   --   --   PHOS 2.7  --   --   --   --    GFR: Estimated Creatinine Clearance: 10.1 mL/min (A) (by C-G formula based on SCr of 3.32 mg/dL (H)). Liver Function Tests: Recent Labs  Lab 03/21/22 2206 03/22/22 0716 03/22/22 1532 03/23/22 0423 03/24/22 0440 03/25/22 0458  AST 32 39  --  44* 47* 43*  ALT 13 11  --  '12 12 13  '$ ALKPHOS 38 35*  --  33* 36* 31*  BILITOT 0.9 2.0* 1.8* 1.6* 1.3* 1.4*  PROT 5.7* 5.1*  --  4.4* 4.4* 4.1*  ALBUMIN 3.4* 3.1*  --  2.7* 2.5* 2.5*   No results for input(s): "LIPASE", "AMYLASE" in the last 168 hours. Recent Labs  Lab 03/22/22 1656  AMMONIA 23   Coagulation Profile: Recent Labs  Lab 03/21/22 2206  INR 1.4*   Cardiac Enzymes: No results for input(s): "CKTOTAL", "CKMB", "CKMBINDEX", "TROPONINI" in the last 168 hours. BNP (last 3 results) No results for input(s): "PROBNP" in the last 8760 hours. HbA1C: No results for input(s): "HGBA1C" in the last 72 hours. CBG: No results for input(s): "GLUCAP" in the last 168 hours. Lipid Profile: No results for input(s): "CHOL", "HDL", "LDLCALC", "TRIG", "CHOLHDL", "LDLDIRECT" in the last 72 hours. Thyroid Function Tests: No results for input(s): "TSH", "T4TOTAL", "FREET4", "T3FREE", "THYROIDAB" in the last 72 hours. Anemia Panel: No results for input(s):  "VITAMINB12", "FOLATE", "FERRITIN", "TIBC", "IRON", "RETICCTPCT" in the last 72 hours. Sepsis Labs: Recent Labs  Lab 03/21/22 2206 03/22/22 0716  PROCALCITON  --  0.94  LATICACIDVEN 1.8  --     Recent Results (from the past 240 hour(s))  Urine Culture     Status: None   Collection Time: 03/21/22  9:23 PM   Specimen: In/Out Cath Urine  Result Value Ref Range Status   Specimen Description   Final    IN/OUT CATH URINE Performed at [facility], [address] [place], [place] [address]    Special Requests   Final    NONE Performed at [facility], [address] [place], [place] [address]    Culture   Final    NO GROWTH Performed at [facility], [place] [address] [place], [place] [address]    Report Status 03/23/2022 FINAL  Final  Resp Panel by RT-PCR (Flu A&B, Covid) Urine, In & Out Cath     Status: None   Collection Time: 03/21/22  9:59 PM   Specimen: Urine, In & Out Cath; Nasal Swab  Result Value Ref Range Status   SARS Coronavirus 2 by RT PCR NEGATIVE NEGATIVE Final    Comment: (NOTE) SARS-CoV-2 target nucleic acids are NOT DETECTED.  The SARS-CoV-2 RNA is generally detectable in upper respiratory specimens during the acute phase of infection. The lowest concentration of SARS-CoV-2 viral copies this assay can detect is 138 copies/mL. A negative result does not preclude SARS-Cov-2 infection and should not be used as the sole basis for treatment or other patient management decisions. A negative result may occur with  improper specimen collection/handling, submission of specimen other  than nasopharyngeal swab, presence of viral mutation(s) within the areas targeted by this assay, and inadequate number of viral copies(<138 copies/mL). A negative result must be combined with clinical observations, patient history, and epidemiological information. The expected result is Negative.  Fact Sheet for Patients:  EntrepreneurPulse.com.au  Fact Sheet for  Healthcare Providers:  IncredibleEmployment.be  This test is no t yet approved or cleared by the [place] FDA and  has been authorized for detection and/or diagnosis of SARS-CoV-2 by FDA under an Emergency Use Authorization (EUA). This EUA will remain  in effect (meaning this test can be used) for the duration of the COVID-19 declaration under Section 564(b)(1) of the Act, 21 U.S.C.section 360bbb-3(b)(1), unless the authorization is terminated  or revoked sooner.       Influenza A by PCR NEGATIVE NEGATIVE Final   Influenza B by PCR NEGATIVE NEGATIVE Final    Comment: (NOTE) The Xpert Xpress SARS-CoV-2/FLU/RSV plus assay is intended as an aid in the diagnosis of influenza from Nasopharyngeal swab specimens and should not be used as a sole basis for treatment. Nasal washings and aspirates are unacceptable for Xpert Xpress SARS-CoV-2/FLU/RSV testing.  Fact Sheet for Patients: EntrepreneurPulse.com.au  Fact Sheet for Healthcare Providers: IncredibleEmployment.be  This test is not yet approved or cleared by the [place] FDA and has been authorized for detection and/or diagnosis of SARS-CoV-2 by FDA under an Emergency Use Authorization (EUA). This EUA will remain in effect (meaning this test can be used) for the duration of the COVID-19 declaration under Section 564(b)(1) of the Act, 21 U.S.C. section 360bbb-3(b)(1), unless the authorization is terminated or revoked.  Performed at [facility], [address] [place], [place] [address]   Blood Culture (routine x 2)     Status: None   Collection Time: 03/21/22 10:07 PM   Specimen: Site Not Specified; Blood  Result Value Ref Range Status   Specimen Description   Final    SITE NOT SPECIFIED BOTTLES DRAWN AEROBIC AND ANAEROBIC   Special Requests Blood Culture adequate volume  Final   Culture   Final    NO GROWTH 5 DAYS Performed at [facility], [address],  [place], [place] [address]    Report Status 03/26/2022 FINAL  Final  Blood Culture (routine x 2)     Status: None   Collection Time: 03/21/22 10:11 PM   Specimen: Site Not Specified; Blood  Result Value Ref Range Status   Specimen Description   Final    SITE NOT SPECIFIED BOTTLES DRAWN AEROBIC AND ANAEROBIC   Special Requests Blood Culture adequate volume  Final   Culture   Final    NO GROWTH 5 DAYS Performed at [facility], [address] [place], [place] [address]    Report Status 03/26/2022 FINAL  Final  KOH prep     Status: None   Collection Time: 03/25/22  1:02 PM   Specimen: PATH Cytology brushing; Body Fluid  Result Value Ref Range Status   Specimen Description ESOPHAGUS  Final   Special Requests NONE  Final   KOH Prep   Final    BUDDING YEAST SEEN Performed at [facility], [address] [place],  [address]    Report Status 03/25/2022 FINAL  Final      Radiology Studies: US RENAL  Result Date: 03/25/2022 CLINICAL DATA:  Acute kidney injury EXAM: RENAL / URINARY TRACT ULTRASOUND COMPLETE COMPARISON:  None Available. FINDINGS: Right Kidney: Renal measurements: 11.8 x 4.8 x 4.8 cm = volume: 142  mL. Echogenicity within normal limits. No mass or hydronephrosis visualized. Left Kidney: Renal measurements: 11.3 x 4.6 x 5.6 cm = volume: 153 mL. Echogenicity within normal limits. Possible small solid mass of the inferior pole of the left kidney measuring 1.1 cm. Bladder: Appears normal for degree of bladder distention. Other: Incidental note of severe splenomegaly, measuring 21.2 x 8.7 x 18.0 cm, as well as splenic varices. Incidental note of cholelithiasis. IMPRESSION: 1. No hydronephrosis. 2. Possible small solid mass of the inferior pole of the left kidney measuring 1.1 cm. Renal protocol CT or MRI is recommended for further evaluation. 3. Incidental note of severe splenomegaly and splenic varices. 4. Incidental note of cholelithiasis. Electronically Signed   By: Delanna Ahmadi  M.D.   On: 03/25/2022 10:22    Scheduled Meds:  acidophilus  1 capsule Oral Daily   allopurinol  200 mg Oral Daily   Chlorhexidine Gluconate Cloth  6 each Topical Daily   metoprolol tartrate  12.5 mg Oral BID   pantoprazole  40 mg Intravenous Q12H   Continuous Infusions:  sodium chloride Stopped (03/26/22 0532)   cefTRIAXone (ROCEPHIN)  IV       LOS: 4 days   Darliss Cheney, MD [facility]  03/26/2022, 8:23 AM   *Please note that this is a verbal dictation therefore any spelling or grammatical errors are due to the "[place] [employer]" system interpretation.  Please page via [place] and do not message via secure chat for urgent patient care matters. Secure chat can be used for non urgent patient care matters.  How to contact the [facility] Attending or Consulting provider [place] or covering provider during after hours [place], for this patient?  Check the care team in [facility] and look for a) attending/consulting TR[facility] provider listed and b) the [facility] team listed. Page or secure chat [phone]. Log into [email or web address] and use [place]'s universal password to access. If you do not have the password, please contact the hospital operator. Locate the [facility] provider you are looking for under [facility] and page to a number that you can be directly reached. If you still have difficulty reaching the provider, please page the [facility] (Director on Call) for the Hospitalists listed on amion for assistance.

## 2022-03-26 NOTE — Evaluation (Signed)
Physical Therapy Evaluation Patient Details Name: Faith Lee MRN: [id number] DOB: 1931/04/21 Today's Date: 03/26/2022  History of Present Illness  Faith Lee is a 88 y.o. female with medical history significant of stage IV mantle cell lymphoma who presents to the emergency department via EMS with generalized weakness and fever.  At bedside, patient was somnolent, though arousable, but was unable to provide any history, history was obtained from ED physician and ED medical record.  Per report, patient had recent chemotherapy, she denies chest pain, shortness of breath, cough or headache or abdominal pain or nausea, vomiting or diarrhea.   Clinical Impression  Patient limited for functional mobility as stated below secondary to weakness, fatigue and poor balance. Patient given frequent cueing for sequencing with poor carry over. She requires mod/max assist to upright trunk and transition to seated EOB. She demonstrates impaired sitting balance and sitting tolerance requiring assist. She is limited by fatigue and requires assist to return to supine and reposition. Patient will benefit from continued physical therapy in hospital and recommended venue below to increase strength, balance, endurance for safe ADLs and gait.        Recommendations for follow up therapy are one component of a multi-disciplinary discharge planning process, led by the attending physician.  Recommendations may be updated based on patient status, additional functional criteria and insurance authorization.  Follow Up Recommendations Skilled nursing-short term rehab (<3 hours/day)    Assistance Recommended at Discharge Frequent or constant Supervision/Assistance  Patient can return home with the following  A lot of help with walking and/or transfers;A lot of help with bathing/dressing/bathroom;Assistance with feeding;Assistance with cooking/housework    Equipment Recommendations None recommended by PT   Recommendations for Other Services       Functional Status Assessment Patient has had a recent decline in their functional status and demonstrates the ability to make significant improvements in function in a reasonable and predictable amount of time.     Precautions / Restrictions Precautions Precautions: Fall Restrictions Weight Bearing Restrictions: No      Mobility  Bed Mobility Overal bed mobility: Needs Assistance Bed Mobility: Supine to Sit, Sit to Supine     Supine to sit: Mod assist, Max assist, HOB elevated Sit to supine: Max assist   General bed mobility comments: Requires mod/max assist to transition to seated EOB, frequent cueing with poor carry over    Transfers                        Ambulation/Gait                  Stairs            Wheelchair Mobility    Modified Rankin (Stroke Patients Only)       Balance Overall balance assessment: Needs assistance Sitting-balance support: Bilateral upper extremity supported Sitting balance-Leahy Scale: Poor Sitting balance - Comments: seated EOB                                     Pertinent Vitals/Pain Pain Assessment Pain Assessment: No/denies pain    Home Living Family/patient expects to be discharged to:: Skilled nursing facility                        Prior Function Prior Level of Function : Needs assist       Physical Assist :  Mobility (physical);ADLs (physical) Mobility (physical): Bed mobility;Transfers ADLs (physical): Dressing;Bathing Mobility Comments: per daughter - uses RW to transfer to w/c with assist ADLs Comments: per pt. daughter- requires assist for bathing/dressing     Hand Dominance        Extremity/Trunk Assessment   Upper Extremity Assessment Upper Extremity Assessment: Defer to OT evaluation RUE:  (unable to asses A/ROM due to decreased cognition- not following commands to raise arms. Did follow command "squeeze my  hand" noted generalized weakness.) LUE:  (unable to asses A/ROM due to decreased cognition- not following commands to raise arms. Did follow command "squeeze my hand" noted generalized weakness.)    Lower Extremity Assessment Lower Extremity Assessment: Generalized weakness    Cervical / Trunk Assessment Cervical / Trunk Assessment: Normal  Communication   Communication: No difficulties  Cognition Arousal/Alertness: Awake/alert Behavior During Therapy: WFL for tasks assessed/performed Overall Cognitive Status: Impaired/Different from baseline Area of Impairment: Orientation, Following commands, Safety/judgement                 Orientation Level: Disoriented to, Place, Time, Situation     Following Commands: Follows one step commands inconsistently Safety/Judgement: Decreased awareness of safety     General Comments: Per pt. daughter, pt. demonstrating altered mental status and decline in cognition from yesterday and from baseline        General Comments General comments (skin integrity, edema, etc.): edema noted in LE    Exercises     Assessment/Plan    PT Assessment Patient needs continued PT services  PT Problem List Decreased strength;Decreased mobility;Decreased activity tolerance;Decreased balance;Decreased cognition       PT Treatment Interventions DME instruction;Therapeutic activities;Gait training;Therapeutic exercise;Patient/family education;Stair training;Balance training;Functional mobility training;Neuromuscular re-education    PT Goals (Current goals can be found in the Care Plan section)  Acute Rehab PT Goals Patient Stated Goal: none stated PT Goal Formulation: With patient Time For Goal Achievement: 04/09/22 Potential to Achieve Goals: Fair    Frequency Min 3X/week     Co-evaluation               AM-PAC PT "6 Clicks" Mobility  Outcome Measure Help needed turning from your back to your side while in a flat bed without using  bedrails?: A Lot Help needed moving from lying on your back to sitting on the side of a flat bed without using bedrails?: A Lot Help needed moving to and from a bed to a chair (including a wheelchair)?: A Lot Help needed standing up from a chair using your arms (e.g., wheelchair or bedside chair)?: A Lot Help needed to walk in hospital room?: Total Help needed climbing 3-5 steps with a railing? : Total 6 Click Score: 10    End of Session   Activity Tolerance: Patient limited by fatigue Patient left: in bed;with family/visitor present;with call bell/phone within reach;with bed alarm set   PT Visit Diagnosis: Unsteadiness on feet (R26.81);Other abnormalities of gait and mobility (R26.89);Muscle weakness (generalized) (M62.81)    Time: 0177-9390 PT Time Calculation (min) (ACUTE ONLY): 14 min   Charges:   PT Evaluation $PT Eval Moderate Complexity: 1 Mod PT Treatments $Therapeutic Activity: 8-22 mins        11:39 AM, 03/26/22 Mearl Latin PT, DPT Physical Therapist at [facility]

## 2022-03-26 NOTE — Anesthesia Postprocedure Evaluation (Signed)
Anesthesia Post Note  Patient: Faith Lee  Procedure(s) Performed: ESOPHAGOGASTRODUODENOSCOPY (EGD) WITH PROPOFOL COLONOSCOPY WITH PROPOFOL ESOPHAGEAL BRUSHING BIOPSY  Patient location during evaluation: Phase II Anesthesia Type: General Level of consciousness: awake Pain management: pain level controlled Vital Signs Assessment: post-procedure vital signs reviewed and stable Respiratory status: spontaneous breathing and respiratory function stable Cardiovascular status: blood pressure returned to baseline and stable Postop Assessment: no headache and no apparent nausea or vomiting Anesthetic complications: no Comments: Late entry   No notable events documented.   Last Vitals:  Vitals:   03/25/22 2207 03/26/22 0532  BP: (!) 112/97 (!) 135/46  Pulse: 87 78  Resp: 20 19  Temp: 36.9 C 36.9 C  SpO2: 99% 100%    Last Pain:  Vitals:   03/26/22 0800  TempSrc:   PainSc: 0-No pain                 Louann Sjogren

## 2022-03-26 NOTE — Consult Note (Signed)
Faith Lee Admit Date: 03/21/2022 03/26/2022 Rexene Agent Requesting Physician:  Doristine Bosworth MD  Reason for Consult:  AoCKD3 HPI:  11F admitted 03/21/2022 after presenting with SIRS including tachycardia, fever, tachypnea, encephalopathy.  PMH notable for stage IV mantle cell lymphoma started chemotherapy in May but her decline globally has limited ability to receive therapy.  She also has hypertension, OA.  Patient was admitted and treated for sepsis likely due to UTI.  Antibiotics have been narrowed to ceftriaxone but she was previously receiving vancomycin and cefepime.    Trend in patient's creatinine as below.  She has baseline CKD and her creatinine has been progressively worsened to 3.3 today.  She has had no contrast exposures.  No obvious nephrotoxins.  Electrolytes are notable for mild acidosis, K of 3.9, BUN of 86.  Renal ultrasound performed yesterday with no evidence of hydronephrosis, there was a possible small mass in the inferior pole of the left kidney, incidental finding of severe splenomegaly with varices noted.  Urine analysis at time of presentation with 6-10 erythrocytes per high-powered field, WBC in clumps present.  Blood cultures no growth at 5 days.  Urine culture was no growth.  Urine output has been oliguric.  0.6 L reported yesterday.  She is overall 5 L positive from presentation.  She has edema.  Hospital course complicated by worsening anemia.  She underwent EGD and colonoscopy yesterday.  Findings included gastritis, esophageal candidiasis, internal hemorrhoids, diverticulosis.  She has required transfusions.  Daughter is in room with patient.  The patient is unable to provide any history given persistent encephalopathy.  With daughter discussed that given her age, progressive debility, difficulty with receiving chemotherapy for her malignancy, and progressive decline in kidney function that she is likely approaching end-of-life.  Discussed full scope of care and a  transition towards comfort/palliation.  Daughter clearly interested in comfort and palliation/hospice.  Creatinine, Ser (mg/dL)  Date Value  03/26/2022 3.32 (H)  03/25/2022 2.85 (H)  03/24/2022 2.43 (H)  03/23/2022 2.29 (H)  03/22/2022 2.06 (H)  03/21/2022 2.27 (H)  03/18/2022 2.30 (H)  03/04/2022 1.16 (H)  02/25/2022 1.52 (H)  02/19/2022 1.94 (H)  ] I/Os: I/O last 3 completed shifts: In: 1985.2 [P.O.:250; I.V.:1735.2] Out: 2700 [Urine:1300; Stool:1400]   ROS NSAIDS: No exposure IV Contrast no exposure TMP/SMX no exposure Hypotension no exposure Balance of 12 systems is negative w/ exceptions as above  PMH  Past Medical History:  Diagnosis Date   Anxiety    Cancer ([place])    GAD (generalized anxiety disorder)    Hypertension    IBS (irritable bowel syndrome)    Irritable bowel disease    Osteoarthritis    Renal disorder    PSH  Past Surgical History:  Procedure Laterality Date   ABDOMINAL HYSTERECTOMY  [place] and 2005   PORTACATH PLACEMENT Right 03/10/2022   Procedure: INSERTION PORT-A-CATH, RIGHT IJ;  Surgeon: Rusty Aus, DO;  Location: [facility];  Service: General;  Laterality: Right;   FH  Family History  Problem Relation Age of Onset   Renal Disease Sister    Multiple myeloma Sister    SH  reports that she has never smoked. She has never used smokeless tobacco. She reports that she does not drink alcohol and does not use drugs. Allergies  Allergies  Allergen Reactions   Lidocaine Other (See Comments) and Hypertension    Patient had chest tightness with stomach pain, numbness on left side  of limbs and unable to speak  PT received lidocaine 03-10-22 for port insertion with no reaction   Rituximab-Pvvr Other (See Comments)    Patient complained of chest pressure/pain    Home medications Prior to Admission medications   Medication Sig Start Date End Date Taking? Authorizing Provider  allopurinol (ZYLOPRIM) 300  MG tablet Take 1 tablet (300 mg total) by mouth daily. Patient taking differently: Take 200 mg by mouth daily. 01/11/22  Yes Doreatha Massed, MD  diphenhydrAMINE (BENADRYL) 25 MG tablet Take 25 mg by mouth every 6 (six) hours as needed for allergies or itching.   Yes [provider]  dronabinol (MARINOL) 2.5 MG capsule Take 1 capsule (2.5 mg total) by mouth daily before lunch. 03/18/22  Yes Sharee Holster, NP  Ensure Max Protein (ENSURE MAX PROTEIN) LIQD due to increased protein/energy needs related to cancer treatments Three Times A Day   Yes [provider]  furosemide (LASIX) 20 MG tablet Take 20 mg by mouth daily. 05/02/21  Yes [provider]  LORazepam (ATIVAN) 0.5 MG tablet Take 1 tablet (0.5 mg total) by mouth 2 (two) times daily. 03/05/22  Yes Sharee Holster, NP  metoprolol tartrate (LOPRESSOR) 25 MG tablet Take 25 mg by mouth 2 (two) times daily. 05/02/21  Yes [provider]  mirtazapine (REMERON) 7.5 MG tablet Take 7.5 mg by mouth at bedtime. 12/14/21  Yes [provider]  sennosides-docusate sodium (SENOKOT-S) 8.6-50 MG tablet Take 1 tablet by mouth in the morning and at bedtime.   Yes [provider]  docusate sodium (COLACE) 100 MG capsule Take 1 capsule (100 mg total) by mouth at bedtime. May take 2 capsules if needed. Patient not taking: Reported on 03/22/2022 02/25/22   Carnella Gu[place], PA-C  oxyCODONE (ROXICODONE) 5 MG immediate release tablet Take 1 tablet (5 mg total) by mouth every 8 (eight) hours as needed. Patient not taking: Reported on 03/22/2022 03/10/22   Pappayliou, Santina Evans A, DO    Current Medications Scheduled Meds:  acidophilus  1 capsule Oral Daily   allopurinol  200 mg Oral Daily   Chlorhexidine Gluconate Cloth  6 each Topical Daily   metoprolol tartrate  12.5 mg Oral BID   pantoprazole  40 mg Intravenous Q12H   Continuous Infusions:  sodium chloride Stopped (03/26/22 0532)   cefTRIAXone (ROCEPHIN)   IV     PRN Meds:.acetaminophen **OR** acetaminophen, ondansetron **OR** ondansetron (ZOFRAN) IV  CBC Recent Labs  Lab 03/24/22 0440 03/25/22 0458 03/26/22 0433  WBC 10.3 15.1* 20.0*  NEUTROABS 1.9 3.5 5.2  HGB 8.5* 8.0* 7.5*  HCT 27.4* 26.2* 25.4*  MCV 105.8* 108.3* 109.5*  PLT 79* 77* 83*   Basic Metabolic Panel Recent Labs  Lab 03/21/22 2206 03/22/22 0716 03/23/22 0423 03/24/22 0440 03/25/22 0458 03/26/22 0433  NA 143 142 146* 145 147* 147*  K 4.8 4.7 4.6 4.4 4.0 3.9  CL 112* 114* 114* 116* 118* 122*  CO2 24 23 23  21* 20* 18*  GLUCOSE 106* 96 83 91 105* 98  BUN 67* 61* 70* 72* 77* 86*  CREATININE 2.27* 2.06* 2.29* 2.43* 2.85* 3.32*  CALCIUM 7.9* 7.5* 7.7* 7.6* 7.7* 8.0*  PHOS  --  2.7  --   --   --   --     Physical Exam   Blood pressure (!) 135/46, pulse 78, temperature 98.5 F (36.9 C), resp. rate 19, height 5\' 4"  (1.626 m), weight 64.8 kg, SpO2 100 %. GEN: Elderly female, confused, chronically  ill-appearing, lying flat in bed ENT: Some temporal wasting present EYES: EOMI CV: Regular, normal S1 and S2 PULM: Clear bilaterally ABD: Soft, nontender SKIN: No rashes or lesions EXT: 2+ lower extremity edema NEURO: Confused, unable to answer any questions, awake  Assessment 31F with AoCKD3, stage IV mantle cell lymphoma, and admitted with SIRS/sepsis likely from UTI, encephalopathy.  AoCKD3: Likely ATN from presenting issues.  No obstruction.  No clear nephrotoxins.  Oliguric.  Given age and frailty he is not a candidate for dialysis nor does daughter desire for her to receive dialysis. SIRS: Improved.  Antibiotics have been narrowed.  Culture data has been negative.  Felt to be a UTI source. AMS: Waxing and waning, likely delirium.  Per [facility] Stage IV mantle cell lymphoma: Unable to receive chemotherapy in June because of decline in status.  It seems unlikely that she will recover sufficiently for consistent chemotherapy. Metabolic acidosis Hypertension,  stable Anemia/thrombocytopenia; transfused during admission underwent EGD and colonoscopy on 6/8 with findings of candidal esophagitis gastritis, diverticulosis and internal hemorrhoids.  Plan As above, daughter agrees that patient is not a candidate for dialysis.  There is no strong indication benefits good that we have discussed this at the current time. Further daughter clearly wishes for patient to transition to comfort measures/hospice.  Preferably in a facility.  Will discuss with [facility]. No other recommendations at the current time.   Rexene Agent  03/26/2022, 10:43 AM

## 2022-03-26 NOTE — Evaluation (Signed)
Occupational Therapy Evaluation Patient Details Name: Faith Lee MRN: [id number] DOB: 02/26/1931 Today's Date: 03/26/2022   History of Present Illness Faith Lee is a 88 y.o. female with medical history significant of stage IV mantle cell lymphoma who presents to the emergency department via EMS with generalized weakness and fever.  At bedside, patient was somnolent, though arousable, but was unable to provide any history, history was obtained from ED physician and ED medical record.  Per report, patient had recent chemotherapy, she denies chest pain, shortness of breath, cough or headache or abdominal pain or nausea, vomiting or diarrhea. (Per MD \)   Clinical Impression   Pt. Agreeable to OT evaluation. Per Pt. Daughter, pt. Resides at [facility] (SNF). Pt. Daughter reports that [name] utilizes RW to complete BSC/wheelchair t/f and requires w/c for mobility, she requires assist for dressing and bathing. Per pt. Daughter, pt. Has had a decline in cognition as compared to baseline and compared to yesterday. Pt. Was unable to follow one step commands to asses ROM, she was able to follow command "squeeze my had"- noted significant weakness. Pt. Disoriented to time, place and situation- pt. Reported that she was in [place] and the month was July. Pt. Required max assist for washing face and HOHA to hold wash rag. Unable to fully assess bed mobility/functional mobility/ and ADLs due to decreased cognition and safety awareness. Pt. Would benefit from continued skilled OT services while in the acute setting. Discharge recommendations are included below.      Recommendations for follow up therapy are one component of a multi-disciplinary discharge planning process, led by the attending physician.  Recommendations may be updated based on patient status, additional functional criteria and insurance authorization.   Follow Up Recommendations  Skilled nursing-short term rehab (<3  hours/day)    Assistance Recommended at Discharge Frequent or constant Supervision/Assistance  Patient can return home with the following A lot of help with walking and/or transfers;A lot of help with bathing/dressing/bathroom;Two people to help with walking and/or transfers;Two people to help with bathing/dressing/bathroom    Functional Status Assessment  Patient has had a recent decline in their functional status and/or demonstrates limited ability to make significant improvements in function in a reasonable and predictable amount of time  Equipment Recommendations  None recommended by OT    Recommendations for Other Services       Precautions / Restrictions Precautions Precautions: Fall Restrictions Weight Bearing Restrictions: No      Mobility Bed Mobility Overal bed mobility: Needs Assistance             General bed mobility comments: could not follow commands- assuming max assist for all bed mobility due to decreased cognition    Transfers Overall transfer level: Needs assistance                        Balance Overall balance assessment: Needs assistance                                         ADL either performed or assessed with clinical judgement   ADL Overall ADL's : Needs assistance/impaired Eating/Feeding: Maximal assistance   Grooming: Maximal assistance Grooming Details (indicate cue type and reason): required HOHA to wash face while supine in bed Upper Body Bathing: Maximal assistance   Lower Body Bathing: Maximal assistance   Upper Body  Dressing : Maximal assistance   Lower Body Dressing: Maximal assistance   Toilet Transfer: +2 for physical assistance;Total assistance   Toileting- Clothing Manipulation and Hygiene: Maximal assistance   Tub/ Shower Transfer: +2 for physical assistance;Maximal assistance   Functional mobility during ADLs: Wheelchair;Maximal assistance       Vision Patient Visual Report:   (unsure due to pt. bein poor historian) Vision Assessment?: Yes Additional Comments: unable to follow comman "follow my finger" did make eye contact when speaking to OT     Perception     Praxis      Pertinent Vitals/Pain Pain Assessment Pain Assessment: No/denies pain     Hand Dominance     Extremity/Trunk Assessment Upper Extremity Assessment Upper Extremity Assessment: Generalized weakness;Difficult to assess due to impaired cognition RUE:  (unable to asses A/ROM due to decreased cognition- not following commands to raise arms. Did follow command "squeeze my hand" noted generalized weakness.) LUE:  (unable to asses A/ROM due to decreased cognition- not following commands to raise arms. Did follow command "squeeze my hand" noted generalized weakness.)   Lower Extremity Assessment Lower Extremity Assessment: Defer to PT evaluation (Did not follow command "lift legs")   Cervical / Trunk Assessment Cervical / Trunk Assessment: Normal   Communication Communication Communication: No difficulties   Cognition Arousal/Alertness: Awake/alert Behavior During Therapy: WFL for tasks assessed/performed Overall Cognitive Status: Impaired/Different from baseline Area of Impairment: Orientation, Following commands, Safety/judgement                 Orientation Level: Disoriented to, Place, Time, Situation     Following Commands: Follows one step commands inconsistently Safety/Judgement: Decreased awareness of safety     General Comments: Per pt. daughter, pt. demonstrating altered mental status and decline in cognition from yesterday and from baseline. She stated this could be due from yesterdays procedure     General Comments  edema noted in [place] expects to be discharged to:: Skilled nursing facility                                        Prior Functioning/Environment Prior Level of Function : Needs assist        Physical Assist : Mobility (physical);ADLs (physical) Mobility (physical): Bed mobility;Transfers ADLs (physical): Dressing;Bathing Mobility Comments: Per pt. daughter- pt. uses RW for [facility] t/f, uses w/c for mobility. ADLs Comments: per pt. daughter- requires assist for bathing/dressing        OT Problem List: Decreased strength;Decreased range of motion;Decreased activity tolerance;Decreased cognition;Decreased safety awareness      OT Treatment/Interventions: Self-care/ADL training;Therapeutic exercise;Therapeutic activities    OT Goals(Current goals can be found in the care plan section) Acute Rehab OT Goals Patient Stated Goal: to go back to SNF for rehab OT Goal Formulation: With patient/family Time For Goal Achievement: 04/09/22 Potential to Achieve Goals: Fair ADL Goals Pt Will Perform Eating: with set-up;sitting Pt Will Perform Grooming: with set-up;sitting Pt Will Perform Upper Body Dressing: with mod assist;sitting Pt Will Transfer to Toilet: with mod assist;squat pivot transfer  OT Frequency: Min 2X/week                  AM-PAC OT "6 Clicks" Daily Activity     Outcome Measure Help from another person eating meals?: Total Help from another person taking care of  personal grooming?: Total Help from another person toileting, which includes using toliet, bedpan, or urinal?: Total Help from another person bathing (including washing, rinsing, drying)?: Total Help from another person to put on and taking off regular upper body clothing?: Total Help from another person to put on and taking off regular lower body clothing?: Total 6 Click Score: 6   End of Session  Pt. Left in bed with MD and daughter at bedside.   Activity Tolerance:   Patient left:    OT Visit Diagnosis: Muscle weakness (generalized) (M62.81)                Time: 2820-6015 OT Time Calculation (min): 12 min Charges:  OT General Charges $OT Visit: 1 Visit OT Evaluation $OT Eval Moderate  Complexity: 1 Mod Rationale for Evaluation and Treatment Rehabilitation   Frederic Jericho OTR/L 03/26/2022, 9:48 AM

## 2022-03-26 NOTE — Progress Notes (Signed)
Subjective:  Patient keeps asking for water.  According to her daughter she has been confused.  No nausea vomiting or rectal bleeding reported.  She is still passing liquid stools.  Current Medications:  Current Facility-Administered Medications:    0.9 %  sodium chloride infusion, , Intravenous, Continuous, Pahwani, Ravi, MD, Last Rate: 75 mL/hr at 03/26/22 1304, New Bag at 03/26/22 1304   acetaminophen (TYLENOL) tablet 650 mg, 650 mg, Oral, Q6H PRN **OR** acetaminophen (TYLENOL) suppository 650 mg, 650 mg, Rectal, Q6H PRN, Adefeso, Oladapo, DO   acidophilus (RISAQUAD) capsule 1 capsule, 1 capsule, Oral, Daily, Carlan, Chelsea L, NP, 1 capsule at 03/26/22 0804   allopurinol (ZYLOPRIM) tablet 200 mg, 200 mg, Oral, Daily, Pahwani, Ravi, MD, 200 mg at 03/26/22 0805   cefTRIAXone (ROCEPHIN) 1 g in sodium chloride 0.9 % 100 mL IVPB, 1 g, Intravenous, Q24H, Pahwani, Ravi, MD, Last Rate: 200 mL/hr at 03/26/22 1333, 1 g at 03/26/22 1333   Chlorhexidine Gluconate Cloth 2 % PADS 6 each, 6 each, Topical, Daily, Pahwani, Einar Grad, MD, 6 each at 03/26/22 1040   metoprolol tartrate (LOPRESSOR) tablet 12.5 mg, 12.5 mg, Oral, BID, Pahwani, Ravi, MD, 12.5 mg at 03/26/22 0805   ondansetron (ZOFRAN) tablet 4 mg, 4 mg, Oral, Q6H PRN **OR** ondansetron (ZOFRAN) injection 4 mg, 4 mg, Intravenous, Q6H PRN, Adefeso, Oladapo, DO   pantoprazole (PROTONIX) injection 40 mg, 40 mg, Intravenous, Q12H, Harper, Kristen S, PA-C, 40 mg at 03/26/22 0805   Objective: Blood pressure (!) 135/46, pulse 78, temperature 98.5 F (36.9 C), resp. rate 19, height $RemoveBe'5\' 4"'xexLhZnvR$  (1.626 m), weight 64.8 kg, SpO2 100 %. Patient is alert and but confused. Abdomen is full.  Bowel sounds are normal.  On palpation is soft and nontender with organomegaly or masses. There is watery stool in the bag.  Rectal tube in place.      Latest Ref Rng & Units 03/26/2022    4:33 AM 03/25/2022    4:58 AM 03/24/2022    4:40 AM  CBC  WBC 4.0 - 10.5 K/uL 20.0  15.1   10.3   Hemoglobin 12.0 - 15.0 g/dL 7.5  8.0  8.5   Hematocrit 36.0 - 46.0 % 25.4  26.2  27.4   Platelets 150 - 400 K/uL 83  77  79        Latest Ref Rng & Units 03/26/2022    4:33 AM 03/25/2022    4:58 AM 03/24/2022    4:40 AM  CMP  Glucose 70 - 99 mg/dL 98  105  91   BUN 8 - 23 mg/dL 86  77  72   Creatinine 0.44 - 1.00 mg/dL 3.32  2.85  2.43   Sodium 135 - 145 mmol/L 147  147  145   Potassium 3.5 - 5.1 mmol/L 3.9  4.0  4.4   Chloride 98 - 111 mmol/L 122  118  116   CO2 22 - 32 mmol/L $RemoveB'18  20  21   'dpybfcif$ Calcium 8.9 - 10.3 mg/dL 8.0  7.7  7.6   Total Protein 6.5 - 8.1 g/dL 4.4  4.1  4.4   Total Bilirubin 0.3 - 1.2 mg/dL 1.1  1.4  1.3   Alkaline Phos 38 - 126 U/L 32  31  36   AST 15 - 41 U/L 41  43  47   ALT 0 - 44 U/L $Remo'15  13  12        'jgtAa$ Latest Ref Rng & Units 03/26/2022  4:33 AM 03/25/2022    4:58 AM 03/24/2022    4:40 AM  Hepatic Function  Total Protein 6.5 - 8.1 g/dL 4.4  4.1  4.4   Albumin 3.5 - 5.0 g/dL 2.6  2.5  2.5   AST 15 - 41 U/L 41  43  47   ALT 0 - 44 U/L $Remo'15  13  12   'DAwVg$ Alk Phosphatase 38 - 126 U/L 32  31  36   Total Bilirubin 0.3 - 1.2 mg/dL 1.1  1.4  1.3   Bilirubin, Direct 0.0 - 0.2 mg/dL 0.3         Assessment:  #1.  Diarrhea.  Stool studies are pending.  No evidence of endoscopic colitis on colonoscopy by Dr. Abbey Chatters yesterday.  It is possible the diarrhea has secondary to small bowel inflammation resulting from chemotherapy.  Infection has not been ruled out yet. If GI pathogen panel is negative would consider octreotide.  #2.  Acute on chronic anemia.  Her stool was guaiac positive.  No bleeding lesion noted on EGD and colonoscopy yesterday.  Hemoglobin is drifting down.  #3.  Candida esophagitis.  No history of dysphagia.  KOH prep is positive.  Will start on Mycostatin orally.  #4.  Chronic kidney disease.  Renal function deteriorating.  #5.  History of mantle cell lymphoma.  She has received 2 cycles of chemotherapy.  Second cycle was 8 days ago.  Family has  decided comfort measures.   Plan:  Mycostatin suspension 500,000 units swish and swallow 4 times daily.

## 2022-03-26 NOTE — TOC Progression Note (Signed)
Transition of Care [facility]) - Progression Note    Patient Details  Name: Faith Lee MRN: [id number] Date of Birth: November 17, 1930  Transition of Care [facility]) CM/SW Contact  Ihor Gully, LCSW Phone Number: 03/26/2022, 12:24 PM  Clinical Narrative:    Daughter, Harle Battiest, confirms that patient is comfort measures. Discussed hospice referral. Referral made to [facility] hospice. Patient is private pay at [facility]. Plan is to return to hospice with [place] services.    Expected Discharge Plan: Long Term Nursing Home Barriers to Discharge: Continued Medical Work up  Expected Discharge Plan and Services Expected Discharge Plan: [place] In-house Referral: Clinical Social Work     Living arrangements for the past 2 months: [place]                                       Social Determinants of Health (SDOH) Interventions    Readmission Risk Interventions    03/22/2022   12:30 PM  Readmission Risk Prevention Plan  Transportation Screening Complete  HRI or Home Care Consult Complete  Social Work Consult for [place] Planning/Counseling Complete  Palliative Care Screening Not Applicable  Medication Review Press photographer) Complete

## 2022-03-27 DIAGNOSIS — R6510 Systemic inflammatory response syndrome (SIRS) of non-infectious origin without acute organ dysfunction: Secondary | ICD-10-CM | POA: Diagnosis not present

## 2022-03-27 LAB — CBC WITH DIFFERENTIAL/PLATELET
Abs Immature Granulocytes: 0 10*3/uL (ref 0.00–0.07)
Band Neutrophils: 1 %
Basophils Absolute: 0 10*3/uL (ref 0.0–0.1)
Basophils Relative: 0 %
Blasts: 3 %
Eosinophils Absolute: 0.6 10*3/uL — ABNORMAL HIGH (ref 0.0–0.5)
Eosinophils Relative: 3 %
HCT: 24.8 % — ABNORMAL LOW (ref 36.0–46.0)
Hemoglobin: 7.5 g/dL — ABNORMAL LOW (ref 12.0–15.0)
Lymphocytes Relative: 82 %
Lymphs Abs: 15.2 10*3/uL — ABNORMAL HIGH (ref 0.7–4.0)
MCH: 33 pg (ref 26.0–34.0)
MCHC: 30.2 g/dL (ref 30.0–36.0)
MCV: 109.3 fL — ABNORMAL HIGH (ref 80.0–100.0)
Monocytes Absolute: 0.4 10*3/uL (ref 0.1–1.0)
Monocytes Relative: 2 %
Neutro Abs: 1.9 10*3/uL (ref 1.7–7.7)
Neutrophils Relative %: 9 %
Platelets: 39 10*3/uL — ABNORMAL LOW (ref 150–400)
RBC: 2.27 MIL/uL — ABNORMAL LOW (ref 3.87–5.11)
RDW: 18.3 % — ABNORMAL HIGH (ref 11.5–15.5)
WBC: 18.5 10*3/uL — ABNORMAL HIGH (ref 4.0–10.5)
nRBC: 0 % (ref 0.0–0.2)

## 2022-03-27 LAB — GASTROINTESTINAL PANEL BY PCR, STOOL (REPLACES STOOL CULTURE)

## 2022-03-27 MED ORDER — MORPHINE SULFATE (PF) 2 MG/ML IV SOLN
2.0000 mg | INTRAVENOUS | Status: DC | PRN
  Administered 2022-03-27: 2 mg via INTRAVENOUS
  Filled 2022-03-27: qty 1

## 2022-03-27 NOTE — Progress Notes (Signed)
PROGRESS NOTE    Faith Lee  KVQ:[id number] DOB: 19-Jan-1931 DOA: 03/21/2022 PCP: Gerlene Fee, NP   Brief Narrative:  HPI: Faith Lee is a 88 y.o. female with medical history significant of stage IV mantle cell lymphoma who presents to the emergency department via EMS with generalized weakness and fever.  At bedside, patient was somnolent, though arousable, but was unable to provide any history, history was obtained from ED physician and ED medical record.  Per report, patient had recent chemotherapy, she denies chest pain, shortness of breath, cough or headache or abdominal pain or nausea, vomiting or diarrhea.   ED Course:  In the emergency department, patient was tachycardic, tachypneic, febrile with a temperature of 102.57F, BP was 111/44.  Work-up in the ED showed leukocytosis, H/H6.9/23.5 (this was 7.9/24.7 on 03/18/2022), platelets 105.  BUN/creatinine 67/2.27 (baseline creatinine at 1.2-1.5).  Fecal occult blood was positive.  Blood culture was pending. CT chest, abdomen and pelvis without contrast showed Diminished left pleural effusion from prior PET. No evidence of pneumonia. Patient was treated with Tylenol, IV vancomycin and cefepime, Flagyl was given, IV hydration was provided. Hospitalist was asked to admit patient for further evaluation and management.  Patient's daughter chose to pursue comfort care starting 03/26/2022.  Assessment & Plan:   Principal Problem:   SIRS (systemic inflammatory response syndrome) (HCC) Active Problems:   Mantle cell lymphoma (HCC)   GI bleed   Acute kidney injury superimposed on chronic kidney disease (HCC)   Thrombocytopenia (HCC)   Symptomatic anemia   Acute blood loss anemia   Essential hypertension   Heme positive stool   Pressure injury of skin   Diarrhea   Sepsis ([place])   Candidal esophagitis (HCC)   Gastritis  Sepsis secondary to UTI, POA: Upon presentation, patient was febrile, tachycardic and tachypneic with  leukocytosis.  Source of infection appears to be likely UTI.  Chest x-ray negative.  CT chest abdomen pelvis is negative for acute pathology.  UA is cloudy with rare bacteria.  Talked to her daughter who told me that patient had seen urologist sometime in the beginning or middle of May and was diagnosed with UTI.  She was given full course of antibiotics.  She was then diagnosed with UTI once again at her nursing home and was given antibiotic but she broke out in rash so antibiotics were discontinued after 1-2 doses.  Patient has now improved.  She has remained afebrile for more than 3 days now.  Both blood as well as urine cultures are negative thus far.  Continue Rocephin to complete 7-day course.  Acute on chronic blood loss anemia/upper GI bleed/gastritis/candidal esophagitis: Patient's baseline hemoglobin appears to be around 7.7, patient was found to have 6.9, she received 2 units of PRBC transfusion.  FOBT was positive.  Posttransfusion hemoglobin over 8 but now drifting down, 7.5 today.  Underwent EGD and colonoscopy on 03/25/2022.  Colonoscopy showed only hemorrhoids and diverticulosis but no bleeding.  EGD showed candidal esophagitis, biopsies were sent, also showed nonbleeding gastritis.  Continue PPI.  KOH was positive for fungus, she has been started on Mycostatin.  Acute toxic encephalopathy: Patient is slightly more confused.  Daughter was not at the bedside but I discussed with Dr. Laural Golden that daughter was concerned about possible stroke.  He discussed with him that as she had requested to transition to comfort care, at this point in time, pursuing further work-up for stroke will not change the management so we will continue to  focus on her comfort.  Awaiting placement by TOC.  Stage IVb mantle cell lymphoma: Receiving chemotherapy Cycle 1 of Bendamustine and rituximab on 02/18/2022.  Bendamustine was dose reduced to 50 mg per metered square.  Second cycle was planned on 03/18/2022 however she was  very weak with diarrhea so this was postponed.  AKI on CKD 3A: Patient's baseline creatinine around 1.2-1.5 with GFR around 38, presented with creatinine of 2.29, which is slowly climbing every day and is 3.3 to today.  Foley inserted.  Ultrasound kidney shows solid mass in the left lower pole of the left kidney.  MRI or CT renal protocol is recommended.  Consulted nephrology who discussed with the daughter that patient at her age with multiple comorbidities is likely not a candidate for dialysis.  After discussion with the daughter, daughter has elected to pursue comfort care on 03/26/2022 and no further aggressive management.  Essential hypertension: Controlled, will continue metoprolol at lower dose of 12.5 mg twice daily.  TLS prophylaxis: Continue allopurinol.  Bilateral lower extremity edema: Hold Lasix due to AKI.  GOC: During my encounter on 03/26/2022, I had frank discussion with the daughter where I laid out the fact that patient is very old with cancer which is also having issues with the treatment and now she is sick with multiple issues and new renal mass which could be cancer.  After discussion, daughter agreed to switch her to DNR.  However later on when nephrology discussed with her, daughter expressed her interest in transitioning to full comfort care.  Patient was transitioned to full comfort care on the afternoon of 03/26/2022.  DVT prophylaxis: SCDs Start: 03/22/22 0323, avoiding heparin products due to possible GI bleed.   Code Status: DNR  Family Communication: None at bedside today.  Status is: Inpatient Remains inpatient appropriate because: Awaiting TOC making arrangements for patient to be transferred to hospice facility.   Estimated body mass index is 24.52 kg/m as calculated from the following:   Height as of this encounter: '5\' 4"'$  (1.626 m).   Weight as of this encounter: 64.8 kg.  Pressure Injury 03/22/22 Coccyx Mid Stage 2 -  Partial thickness loss of dermis  presenting as a shallow open injury with a red, pink wound bed without slough. 3 cm x 0.25 cm (Active)  03/22/22 0900  Location: Coccyx (cleft between buttocks)  Location Orientation: Mid  Staging: Stage 2 -  Partial thickness loss of dermis presenting as a shallow open injury with a red, pink wound bed without slough.  Wound Description (Comments): 3 cm x 0.25 cm  Present on Admission: Yes  Dressing Type Foam - Lift dressing to assess site every shift 03/26/22 0800   Nutritional Assessment: Body mass index is 24.52 kg/m.Marland Kitchen Seen by dietician.  I agree with the assessment and plan as outlined below: Nutrition Status:        . Skin Assessment: I have examined the patient's skin and I agree with the wound assessment as performed by the wound care RN as outlined below: Pressure Injury 03/22/22 Coccyx Mid Stage 2 -  Partial thickness loss of dermis presenting as a shallow open injury with a red, pink wound bed without slough. 3 cm x 0.25 cm (Active)  03/22/22 0900  Location: Coccyx (cleft between buttocks)  Location Orientation: Mid  Staging: Stage 2 -  Partial thickness loss of dermis presenting as a shallow open injury with a red, pink wound bed without slough.  Wound Description (Comments): 3 cm x 0.25 cm  Present on Admission: Yes  Dressing Type Foam - Lift dressing to assess site every shift 03/26/22 0800    Consultants:  GI Nephrology Palliative care Procedures:  EGD and colonoscopy 03/25/2022  Antimicrobials:  Anti-infectives (From admission, onward)    Start     Dose/Rate Route Frequency Ordered Stop   03/25/22 1245  cefTRIAXone (ROCEPHIN) 1 g in sodium chloride 0.9 % 100 mL IVPB        1 g 200 mL/hr over 30 Minutes Intravenous Every 24 hours 03/25/22 1151 03/29/22 1244   03/23/22 2200  vancomycin (VANCOREADY) IVPB 750 mg/150 mL  Status:  Discontinued        750 mg 150 mL/hr over 60 Minutes Intravenous Every 48 hours 03/22/22 0024 03/24/22 0820   03/22/22 2200   ceFEPIme (MAXIPIME) 2 g in sodium chloride 0.9 % 100 mL IVPB  Status:  Discontinued        2 g 200 mL/hr over 30 Minutes Intravenous Every 24 hours 03/22/22 0013 03/25/22 1151   03/21/22 2200  ceFEPIme (MAXIPIME) 2 g in sodium chloride 0.9 % 100 mL IVPB        2 g 200 mL/hr over 30 Minutes Intravenous  Once 03/21/22 2159 03/21/22 2256   03/21/22 2200  metroNIDAZOLE (FLAGYL) IVPB 500 mg        500 mg 100 mL/hr over 60 Minutes Intravenous  Once 03/21/22 2159 03/21/22 2328   03/21/22 2200  vancomycin (VANCOCIN) IVPB 1000 mg/200 mL premix        1,000 mg 200 mL/hr over 60 Minutes Intravenous  Once 03/21/22 2159 03/21/22 2328         Subjective:  Patient seen and examined.  No family member was present at bedside.  Patient was alert but was confused.  Objective: Vitals:   03/25/22 2207 03/26/22 0532 03/26/22 2243 03/27/22 0456  BP: (!) 112/97 (!) 135/46  (!) 121/106  Pulse: 87 78 91 86  Resp: '20 19  20  '$ Temp: 98.4 F (36.9 C) 98.5 F (36.9 C)  98 F (36.7 C)  TempSrc: Oral     SpO2: 99% 100%  100%  Weight:      Height:        Intake/Output Summary (Last 24 hours) at 03/27/2022 1330 Last data filed at 03/27/2022 0900 Gross per 24 hour  Intake 229.72 ml  Output 400 ml  Net -170.28 ml    Filed Weights   03/21/22 2102  Weight: 64.8 kg    Examination:  General exam: Appears calm and comfortable  Respiratory system: Clear to auscultation. Respiratory effort normal. Cardiovascular system: S1 & S2 heard, RRR. No JVD, murmurs, rubs, gallops or clicks. No pedal edema. Gastrointestinal system: Abdomen is nondistended, soft and nontender. No organomegaly or masses felt. Normal bowel sounds heard. Central nervous system: Alert but not oriented. No focal neurological deficits. Extremities: Symmetric 5 x 5 power. Skin: No rashes, lesions or ulcers.    Data Reviewed: I have personally reviewed following labs and imaging studies  CBC: Recent Labs  Lab 03/23/22 0423  03/24/22 0440 03/25/22 0458 03/26/22 0433 03/27/22 0601  WBC 9.7 10.3 15.1* 20.0* 18.5*  NEUTROABS 2.0 1.9 3.5 5.2 1.9  HGB 8.5* 8.5* 8.0* 7.5* 7.5*  HCT 27.2* 27.4* 26.2* 25.4* 24.8*  MCV 103.0* 105.8* 108.3* 109.5* 109.3*  PLT 82* 79* 77* 83* 39*    Basic Metabolic Panel: Recent Labs  Lab 03/22/22 0716 03/23/22 0423 03/24/22 0440 03/25/22 0458 03/26/22 0433  NA 142 146* 145 147* 147*  K 4.7 4.6 4.4 4.0 3.9  CL 114* 114* 116* 118* 122*  CO2 23 23 21* 20* 18*  GLUCOSE 96 83 91 105* 98  BUN 61* 70* 72* 77* 86*  CREATININE 2.06* 2.29* 2.43* 2.85* 3.32*  CALCIUM 7.5* 7.7* 7.6* 7.7* 8.0*  MG 2.5*  --   --   --   --   PHOS 2.7  --   --   --   --     GFR: Estimated Creatinine Clearance: 10.1 mL/min (A) (by C-G formula based on SCr of 3.32 mg/dL (H)). Liver Function Tests: Recent Labs  Lab 03/22/22 0716 03/22/22 1532 03/23/22 0423 03/24/22 0440 03/25/22 0458 03/26/22 0433  AST 39  --  44* 47* 43* 41  ALT 11  --  '12 12 13 15  '$ ALKPHOS 35*  --  33* 36* 31* 32*  BILITOT 2.0* 1.8* 1.6* 1.3* 1.4* 1.1  PROT 5.1*  --  4.4* 4.4* 4.1* 4.4*  ALBUMIN 3.1*  --  2.7* 2.5* 2.5* 2.6*    No results for input(s): "LIPASE", "AMYLASE" in the last 168 hours. Recent Labs  Lab 03/22/22 1656  AMMONIA 23    Coagulation Profile: Recent Labs  Lab 03/21/22 2206  INR 1.4*    Cardiac Enzymes: No results for input(s): "CKTOTAL", "CKMB", "CKMBINDEX", "TROPONINI" in the last 168 hours. BNP (last 3 results) No results for input(s): "PROBNP" in the last 8760 hours. HbA1C: No results for input(s): "HGBA1C" in the last 72 hours. CBG: No results for input(s): "GLUCAP" in the last 168 hours. Lipid Profile: No results for input(s): "CHOL", "HDL", "LDLCALC", "TRIG", "CHOLHDL", "LDLDIRECT" in the last 72 hours. Thyroid Function Tests: No results for input(s): "TSH", "T4TOTAL", "FREET4", "T3FREE", "THYROIDAB" in the last 72 hours. Anemia Panel: No results for input(s): "VITAMINB12",  "FOLATE", "FERRITIN", "TIBC", "IRON", "RETICCTPCT" in the last 72 hours. Sepsis Labs: Recent Labs  Lab 03/21/22 2206 03/22/22 0716  PROCALCITON  --  0.94  LATICACIDVEN 1.8  --      Recent Results (from the past 240 hour(s))  Urine Culture     Status: None   Collection Time: 03/21/22  9:23 PM   Specimen: In/Out Cath Urine  Result Value Ref Range Status   Specimen Description   Final    IN/OUT CATH URINE Performed at [facility], [address], [place], [place] [address]    Special Requests   Final    NONE Performed at [facility], [address], [place], [place] [address]    Culture   Final    NO GROWTH Performed at [facility], [place] [address], [place], [place] [address]    Report Status 03/23/2022 FINAL  Final  Resp Panel by RT-PCR (Flu A&B, Covid) Urine, In & Out Cath     Status: None   Collection Time: 03/21/22  9:59 PM   Specimen: Urine, In & Out Cath; Nasal Swab  Result Value Ref Range Status   SARS Coronavirus 2 by RT PCR NEGATIVE NEGATIVE Final    Comment: (NOTE) SARS-CoV-2 target nucleic acids are NOT DETECTED.  The SARS-CoV-2 RNA is generally detectable in upper respiratory specimens during the acute phase of infection. The lowest concentration of SARS-CoV-2 viral copies this assay can detect is 138 copies/mL. A negative result does not preclude SARS-Cov-2 infection and should not be used as the sole basis for treatment or other patient management decisions. A negative result may occur with  improper specimen collection/handling, submission of specimen other than nasopharyngeal swab, presence  of viral mutation(s) within the areas targeted by this assay, and inadequate number of viral copies(<138 copies/mL). A negative result must be combined with clinical observations, patient history, and epidemiological information. The expected result is Negative.  Fact Sheet for Patients:  EntrepreneurPulse.com.au  Fact Sheet for Healthcare  Providers:  IncredibleEmployment.be  This test is no t yet approved or cleared by the [place] FDA and  has been authorized for detection and/or diagnosis of SARS-CoV-2 by FDA under an Emergency Use Authorization (EUA). This EUA will remain  in effect (meaning this test can be used) for the duration of the COVID-19 declaration under Section 564(b)(1) of the Act, 21 U.S.C.section 360bbb-3(b)(1), unless the authorization is terminated  or revoked sooner.       Influenza A by PCR NEGATIVE NEGATIVE Final   Influenza B by PCR NEGATIVE NEGATIVE Final    Comment: (NOTE) The Xpert Xpress SARS-CoV-2/FLU/RSV plus assay is intended as an aid in the diagnosis of influenza from Nasopharyngeal swab specimens and should not be used as a sole basis for treatment. Nasal washings and aspirates are unacceptable for Xpert Xpress SARS-CoV-2/FLU/RSV testing.  Fact Sheet for Patients: EntrepreneurPulse.com.au  Fact Sheet for Healthcare Providers: IncredibleEmployment.be  This test is not yet approved or cleared by the [place] FDA and has been authorized for detection and/or diagnosis of SARS-CoV-2 by FDA under an Emergency Use Authorization (EUA). This EUA will remain in effect (meaning this test can be used) for the duration of the COVID-19 declaration under Section 564(b)(1) of the Act, 21 U.S.C. section 360bbb-3(b)(1), unless the authorization is terminated or revoked.  Performed at [facility] [address], [place], [place] [address]   Blood Culture (routine x 2)     Status: None   Collection Time: 03/21/22 10:07 PM   Specimen: Site Not Specified; Blood  Result Value Ref Range Status   Specimen Description   Final    SITE NOT SPECIFIED BOTTLES DRAWN AEROBIC AND ANAEROBIC   Special Requests Blood Culture adequate volume  Final   Culture   Final    NO GROWTH 5 DAYS Performed at [facility], [address], [place],  [place] [address]    Report Status 03/26/2022 FINAL  Final  Blood Culture (routine x 2)     Status: None   Collection Time: 03/21/22 10:11 PM   Specimen: Site Not Specified; Blood  Result Value Ref Range Status   Specimen Description   Final    SITE NOT SPECIFIED BOTTLES DRAWN AEROBIC AND ANAEROBIC   Special Requests Blood Culture adequate volume  Final   Culture   Final    NO GROWTH 5 DAYS Performed at [facility], [address] [place], [place] [address]    Report Status 03/26/2022 FINAL  Final  KOH prep     Status: None   Collection Time: 03/25/22  1:02 PM   Specimen: PATH Cytology brushing; Body Fluid  Result Value Ref Range Status   Specimen Description ESOPHAGUS  Final   Special Requests NONE  Final   KOH Prep   Final    BUDDING YEAST SEEN Performed at [facility], [address] [place], [place] [address]    Report Status 03/25/2022 FINAL  Final      Radiology Studies: No results found.  Scheduled Meds:  acidophilus  1 capsule Oral Daily   allopurinol  200 mg Oral Daily   Chlorhexidine Gluconate Cloth  6 each Topical Daily   metoprolol tartrate  12.5 mg Oral BID  nystatin  5 mL Oral QID   pantoprazole  40 mg Intravenous Q12H   Continuous Infusions:  sodium chloride 75 mL/hr at 03/27/22 0341   cefTRIAXone (ROCEPHIN)  IV 1 g (03/27/22 1227)     LOS: 5 days   Darliss Cheney, MD [facility]  03/27/2022, 1:30 PM   *Please note that this is a verbal dictation therefore any spelling or grammatical errors are due to the "[place]" system interpretation.  Please page via [place] and do not message via secure chat for urgent patient care matters. Secure chat can be used for non urgent patient care matters.  How to contact the [facility] Attending or Consulting provider [place] or covering provider during after hours [place], for this patient?  Check the care team in [facility] and look for a) attending/consulting TR[facility] provider listed and b) the [facility] team listed. Page or  secure chat [phone]. Log into [email or web address] and use [place]'s universal password to access. If you do not have the password, please contact the hospital operator. Locate the [facility] provider you are looking for under [facility] and page to a number that you can be directly reached. If you still have difficulty reaching the provider, please page the [facility] (Director on Call) for the Hospitalists listed on amion for assistance.

## 2022-03-27 NOTE — Progress Notes (Signed)
Patient restless throughout the night, unable to answer many questions related to orientation status. She was unable to swallow any medication last night due to ams, mouth care provided. Patient pulled out only IV site this morning. New IV placed.

## 2022-03-27 NOTE — Progress Notes (Signed)
Patient's condition discussed with her daughter Harle Battiest who is at bedside. She says patient has been confused.  Her daughter is worried that she may have had a stroke. Patient has not been eating or drinking. She does not have facial asymmetry. She does not follow commands when I asked her to squeeze my fingers. Confusion may be due to hospitalization and acute on chronic illness. Patient is now comfort measures. Bag contains 100 mL of brownish-orange stool. I feel rectal tube can be removed.    I would agree that goal at this point is to keep her comfortable and minimize her suffering.

## 2022-03-27 NOTE — Progress Notes (Signed)
CSW spoke with receptionist at [facility] who states the facility can accept the patient back tomorrow - MD aware.  Madilyn Fireman, MSW, LCSW Transitions of Care  Clinical Social Worker II [phone]

## 2022-03-28 ENCOUNTER — Telehealth: Payer: Self-pay | Admitting: Family

## 2022-03-28 DIAGNOSIS — R6510 Systemic inflammatory response syndrome (SIRS) of non-infectious origin without acute organ dysfunction: Secondary | ICD-10-CM | POA: Diagnosis not present

## 2022-03-28 MED ORDER — OMEPRAZOLE 40 MG PO CPDR
40.0000 mg | DELAYED_RELEASE_CAPSULE | Freq: Two times a day (BID) | ORAL | 0 refills | Status: DC
Start: 2022-03-28 — End: 2022-03-29

## 2022-03-28 MED ORDER — NYSTATIN 100000 UNIT/ML MT SUSP: 5.0000 mL | Freq: Four times a day (QID) | OROMUCOSAL | 0 refills | Status: DC

## 2022-03-28 NOTE — Progress Notes (Signed)
Nsg Discharge Note  Admit Date:  03/21/2022 Discharge date: 03/28/2022   Clancy Gourd to be D/C'd Home per MD order.  AVS completed.  Patient/caregiver able to verbalize understanding.  Discharge Medication: Allergies as of 03/28/2022       Reactions   Lidocaine Other (See Comments), Hypertension   Patient had chest tightness with stomach pain, numbness on left side of limbs and unable to speak PT received lidocaine 03-10-22 for port insertion with no reaction   Rituximab-pvvr Other (See Comments)   Patient complained of chest pressure/pain        Medication List     STOP taking these medications    docusate sodium 100 MG capsule Commonly known as: Colace   LORazepam 0.5 MG tablet Commonly known as: ATIVAN   oxyCODONE 5 MG immediate release tablet Commonly known as: Roxicodone       TAKE these medications    allopurinol 300 MG tablet Commonly known as: ZYLOPRIM Take 1 tablet (300 mg total) by mouth daily. What changed: how much to take   diphenhydrAMINE 25 MG tablet Commonly known as: BENADRYL Take 25 mg by mouth every 6 (six) hours as needed for allergies or itching.   dronabinol 2.5 MG capsule Commonly known as: Marinol Take 1 capsule (2.5 mg total) by mouth daily before lunch.   Ensure Max Protein Liqd due to increased protein/energy needs related to cancer treatments Three Times A Day   furosemide 20 MG tablet Commonly known as: LASIX Take 20 mg by mouth daily.   metoprolol tartrate 25 MG tablet Commonly known as: LOPRESSOR Take 25 mg by mouth 2 (two) times daily.   mirtazapine 7.5 MG tablet Commonly known as: REMERON Take 7.5 mg by mouth at bedtime.   nystatin 100000 UNIT/ML suspension Commonly known as: MYCOSTATIN Take 5 mLs (500,000 Units total) by mouth 4 (four) times daily for 10 days.   omeprazole 40 MG capsule Commonly known as: PRILOSEC Take 1 capsule (40 mg total) by mouth 2 (two) times daily.   sennosides-docusate sodium  8.6-50 MG tablet Commonly known as: SENOKOT-S Take 1 tablet by mouth in the morning and at bedtime.        Discharge Assessment: Vitals:   03/27/22 0456 03/27/22 1347  BP: (!) 121/106 112/60  Pulse: 86 91  Resp: 20 20  Temp: 98 F (36.7 C) 98.7 F (37.1 C)  SpO2: 100% 100%   Skin clean, dry and intact without evidence of skin break down, no evidence of skin tears noted. IV catheter discontinued intact. Site without signs and symptoms of complications - no redness or edema noted at insertion site, patient denies c/o pain - only slight tenderness at site.  Dressing with slight pressure applied.  D/c Instructions-Education: Discharge instructions given to patient/family with verbalized understanding. D/c education completed with patient/family including follow up instructions, medication list, d/c activities limitations if indicated, with other d/c instructions as indicated by MD - patient able to verbalize understanding, all questions fully answered. Patient instructed to return to ED, call 911, or call MD for any changes in condition.  Patient escorted via [place], and D/C home via private auto.  Kathie Rhodes, RN 03/28/2022 9:41 AM

## 2022-03-28 NOTE — Progress Notes (Addendum)
Patient can return to [facility]. The number to call for report is [phone].  CSW spoke with patient's daughter to inform her of discharge plan.  Madilyn Fireman, MSW, LCSW Transitions of Care  Clinical Social Worker II [phone]

## 2022-03-28 NOTE — Telephone Encounter (Signed)
Facility Nurse called states patient was discharged from hospital 03/27/2022 on Hospice service.foley catheter in place for comfort care requested orders to keep foley catheter. Patient unable to swallow oral medication.she was NPO during hospital admission.Orders given to keep foley catheter for comfort. Advised to discontinue oral medication due to inability to swallow.Restart Ativan 0.5 mg tablet one by mouth/SL twice daily as needed for agitation.please follow up.

## 2022-03-28 NOTE — Discharge Summary (Signed)
[facility] Discharge Summary  Faith Lee PNT:[id number] DOB: 1931/03/14 DOA: 03/21/2022  PCP: Gerlene Fee, NP  Admit date: 03/21/2022 Discharge date: 03/28/2022 30 Day Unplanned Readmission Risk Score    Flowsheet Row ED to Hosp-Admission (Current) from 03/21/2022 in [facility]  30 Day Unplanned Readmission Risk Score (%) 22.9 Filed at 03/28/2022 0801       This score is the patient's risk of an unplanned readmission within 30 days of being discharged (0 -100%). The score is based on dignosis, age, lab data, medications, orders, and past utilization.   Low:  0-14.9   Medium: 15-21.9   High: 22-29.9   Extreme: 30 and above          [facility]  Recommendations for Outpatient Follow-up:  Follow up with PCP in 1-2 weeks Please obtain BMP/CBC in one week Please follow up with your PCP on the following pending results: Unresulted Labs (From admission, onward)     Start     Ordered   03/23/22 0500  CBC with Differential/Platelet  Daily,   R      03/22/22 [place]: None Equipment/Devices: None  Discharge Condition: Guarded CODE STATUS: DNR Diet recommendation: As pleased by patient  Subjective: Seen and examined.  Patient alert but not oriented.  Looks comfortable.  Brief/Interim Summary: Faith Lee is a 88 y.o. female with medical history significant of stage IV mantle cell lymphoma and multiple other comorbidities who presented to the emergency department via EMS with generalized weakness and fever.  At bedside, patient was somnolent, though arousable, but was unable to provide any history.    In the emergency department, patient was tachycardic, tachypneic, febrile with a temperature of 102.64F, BP was 111/44.  Work-up in the ED showed leukocytosis, H/H6.9/23.5 (this was 7.9/24.7 on 03/18/2022), platelets 105.  BUN/creatinine 67/2.27 (baseline creatinine  at 1.2-1.5).  Fecal occult blood was positive.   CT chest, abdomen and pelvis without contrast showed Diminished left pleural effusion from prior PET. No evidence of pneumonia. Patient was treated with Tylenol, IV vancomycin and cefepime, Flagyl was given, IV hydration was provided.  She was admitted under hospital service.  Detailed hospitalization as below.   Sepsis secondary to UTI, POA: Upon presentation, patient was febrile, tachycardic and tachypneic with leukocytosis.  Source of infection appears to be likely UTI.  Chest x-ray negative.  CT chest abdomen pelvis is negative for acute pathology.  UA is cloudy with rare bacteria.  Talked to her daughter who told me that patient had seen urologist sometime in the beginning or middle of May and was diagnosed with UTI.  She was given full course of antibiotics.  She was then diagnosed with UTI once again at her nursing home and was given antibiotic but she broke out in rash so antibiotics were discontinued after 1-2 doses.  Patient has now improved.  She has remained afebrile for more than 4 days now.  Both blood as well as urine cultures are negative thus far.  She received 7 days of IV antibiotics.   Acute on chronic blood loss anemia/upper GI bleed/gastritis/candidal esophagitis: Patient's baseline hemoglobin appears to be around 7.7, patient was found to have 6.9, she received 2 units of PRBC transfusion.  FOBT was positive.  Posttransfusion hemoglobin over 8 but now drifting down, 7.5 today.  Underwent EGD and colonoscopy on 03/25/2022.  Colonoscopy showed only hemorrhoids and diverticulosis but no bleeding.  EGD showed candidal esophagitis, biopsies were sent, also showed nonbleeding gastritis.  Continue PPI.  KOH was positive for fungus, she was started on Mycostatin by GI which we are continuing for 10 more days.  We are also discharging her on twice daily PPI.   Acute toxic encephalopathy: Patient presented with acute toxic encephalopathy secondary  to sepsis but then she improved with the treatment.  Subsequently after discussion with the daughter, patient chose to pursue comfort care on 03/26/2022 and since then patient has been more confused.   Stage IVb mantle cell lymphoma: Receiving chemotherapy Cycle 1 of Bendamustine and rituximab on 02/18/2022.  Bendamustine was dose reduced to 50 mg per metered square.  Second cycle was planned on 03/18/2022 however she was very weak with diarrhea so this was postponed.   AKI on CKD 3A: Patient's baseline creatinine around 1.2-1.5 with GFR around 38, presented with creatinine of 2.29, which is slowly climbing every day and is 3.3 today.  Foley inserted.  Ultrasound kidney shows solid mass in the left lower pole of the left kidney.  MRI or CT renal protocol is recommended.  Consulted nephrology who discussed with the daughter that patient at her age with multiple comorbidities is likely not a candidate for dialysis.  After discussion with the daughter, daughter had elected to pursue comfort care on 03/26/2022 and elected for no further aggressive management.   Essential hypertension: Controlled, will continue metoprolol at lower dose of 12.5 mg twice daily.   TLS prophylaxis: Continue allopurinol.   GOC: During my encounter on 03/26/2022, I had frank discussion with the daughter about CODE STATUS and after understanding the fact that patient carries very poor prognosis, daughter decided to change CODE STATUS to DNR.  However later on when nephrology discussed with her, daughter expressed her interest in transitioning to full comfort care.  Patient was transitioned to full comfort care on the afternoon of 03/26/2022.  Per daughters desire, patient is now going to be discharged back to pain center with hospice.  I have spoken to the daughter over the phone and she is in agreement with that plan.  Discharge Diagnoses:  Principal Problem:   SIRS (systemic inflammatory response syndrome) (HCC) Active Problems:   Mantle  cell lymphoma (HCC)   GI bleed   Acute kidney injury superimposed on chronic kidney disease (HCC)   Thrombocytopenia (HCC)   Symptomatic anemia   Acute blood loss anemia   Essential hypertension   Heme positive stool   Pressure injury of skin   Diarrhea   Sepsis ([place])   Candidal esophagitis ([place])   Gastritis    Discharge Instructions   Allergies as of 03/28/2022       Reactions   Lidocaine Other (See Comments), Hypertension   Patient had chest tightness with stomach pain, numbness on left side of limbs and unable to speak PT received lidocaine 03-10-22 for port insertion with no reaction   Rituximab-pvvr Other (See Comments)   Patient complained of chest pressure/pain        Medication List     STOP taking these medications    docusate sodium 100 MG capsule Commonly known as: Colace   LORazepam 0.5 MG tablet Commonly known as: ATIVAN   oxyCODONE 5 MG immediate release tablet Commonly known as: Roxicodone       TAKE these medications    allopurinol 300 MG tablet Commonly known as: ZYLOPRIM Take 1 tablet (300 mg total) by  mouth daily. What changed: how much to take   diphenhydrAMINE 25 MG tablet Commonly known as: BENADRYL Take 25 mg by mouth every 6 (six) hours as needed for allergies or itching.   dronabinol 2.5 MG capsule Commonly known as: Marinol Take 1 capsule (2.5 mg total) by mouth daily before lunch.   Ensure Max Protein Liqd due to increased protein/energy needs related to cancer treatments Three Times A Day   furosemide 20 MG tablet Commonly known as: LASIX Take 20 mg by mouth daily.   metoprolol tartrate 25 MG tablet Commonly known as: LOPRESSOR Take 25 mg by mouth 2 (two) times daily.   mirtazapine 7.5 MG tablet Commonly known as: REMERON Take 7.5 mg by mouth at bedtime.   nystatin 100000 UNIT/ML suspension Commonly known as: MYCOSTATIN Take 5 mLs (500,000 Units total) by mouth 4 (four) times daily for 10 days.   omeprazole  40 MG capsule Commonly known as: PRILOSEC Take 1 capsule (40 mg total) by mouth 2 (two) times daily.   sennosides-docusate sodium 8.6-50 MG tablet Commonly known as: SENOKOT-S Take 1 tablet by mouth in the morning and at bedtime.        Follow-up Information     Gerlene Fee, NP Follow up in 1 week(s).   Specialty: Geriatric Medicine Contact information: [place] [address] [phone]                Allergies  Allergen Reactions   Lidocaine Other (See Comments) and Hypertension    Patient had chest tightness with stomach pain, numbness on left side of limbs and unable to speak  PT received lidocaine 03-10-22 for port insertion with no reaction   Rituximab-Pvvr Other (See Comments)    Patient complained of chest pressure/pain     Consultations: GI nephrology   Procedures/Studies: US RENAL  Result Date: 03/25/2022 CLINICAL DATA:  Acute kidney injury EXAM: RENAL / URINARY TRACT ULTRASOUND COMPLETE COMPARISON:  None Available. FINDINGS: Right Kidney: Renal measurements: 11.8 x 4.8 x 4.8 cm = volume: 142 mL. Echogenicity within normal limits. No mass or hydronephrosis visualized. Left Kidney: Renal measurements: 11.3 x 4.6 x 5.6 cm = volume: 153 mL. Echogenicity within normal limits. Possible small solid mass of the inferior pole of the left kidney measuring 1.1 cm. Bladder: Appears normal for degree of bladder distention. Other: Incidental note of severe splenomegaly, measuring 21.2 x 8.7 x 18.0 cm, as well as splenic varices. Incidental note of cholelithiasis. IMPRESSION: 1. No hydronephrosis. 2. Possible small solid mass of the inferior pole of the left kidney measuring 1.1 cm. Renal protocol CT or MRI is recommended for further evaluation. 3. Incidental note of severe splenomegaly and splenic varices. 4. Incidental note of cholelithiasis. Electronically Signed   By: Delanna Ahmadi M.D.   On: 03/25/2022 10:22   CT HEAD WO CONTRAST (5MM)  Result  Date: 03/22/2022 CLINICAL DATA:  Neuro deficit, generalized weakness and fever EXAM: CT HEAD WITHOUT CONTRAST TECHNIQUE: Contiguous axial images were obtained from the base of the skull through the vertex without intravenous contrast. RADIATION DOSE REDUCTION: This exam was performed according to the departmental dose-optimization program which includes automated exposure control, adjustment of the mA and/or kV according to patient size and/or use of iterative reconstruction technique. COMPARISON:  CT head and MRI brain May 23, 2014 FINDINGS: Brain: Mild age related global parenchymal volume loss. Scattered and more confluent areas of white matter hypodensity nonspecific but most commonly reflecting chronic ischemic white matter disease. No  evidence of large acute infarction, hemorrhage, hydrocephalus, extra-axial collection or mass lesion/mass effect. Dense dural calcifications. Vascular: No hyperdense vessel. Atherosclerotic calcifications of the internal carotid arteries at the skull base. Skull: Hyperostosis frontalis. Negative for fracture or focal lesion. Sinuses/Orbits: Opacification of the right frontal sinus with pneumatized partial opacification of the left frontal sinus. The remainder of the visualized paranasal sinuses are predominantly clear. Orbits are grossly unremarkable. Other: Mastoid air cells are predominantly clear. Probable dense cerumen in the bilateral external auditory canals. IMPRESSION: No acute intracranial abnormality. Mild age-related global parenchymal volume loss and white matter changes of chronic ischemic small vessel disease. Frontal paranasal sinus disease. Electronically Signed   By: Dahlia Bailiff M.D.   On: 03/22/2022 17:31   CT CHEST ABDOMEN PELVIS WO CONTRAST  Result Date: 03/22/2022 CLINICAL DATA:  88 year old with fever. Sepsis. History of cancer, radiologic records indicates lymphocytosis. EXAM: CT CHEST, ABDOMEN AND PELVIS WITHOUT CONTRAST TECHNIQUE: Multidetector CT  imaging of the chest, abdomen and pelvis was performed following the standard protocol without IV contrast. RADIATION DOSE REDUCTION: This exam was performed according to the departmental dose-optimization program which includes automated exposure control, adjustment of the mA and/or kV according to patient size and/or use of iterative reconstruction technique. COMPARISON:  Chest radiograph yesterday. Noncontrast abdominal CT 01/29/2022, PET CT 01/21/2022 FINDINGS: CT CHEST FINDINGS Cardiovascular: Right chest port with tip in the SVC. Aortic atherosclerosis without aneurysm. The heart is upper normal in size. No pericardial effusion. Decreased density of the blood pool consistent with anemia. Mediastinum/Nodes: 14 mm right supraclavicular node. Anterior paratracheal nodal conglomerate measures 18 mm AP dimension. There are prominent bilateral axillary nodes. Limited assessment for hilar adenopathy on this unenhanced exam. No esophageal wall thickening. Lungs/Pleura: Diminished left pleural effusion from prior PET. No acute airspace disease. No pulmonary nodule. No endobronchial lesion. Musculoskeletal: There are no acute or suspicious osseous abnormalities. CT ABDOMEN PELVIS FINDINGS Hepatobiliary: There is no evidence of focal hepatic lesion on this unenhanced exam. Question of periportal edema. Multiple gallstones without abnormal gallbladder distention or evidence of inflammation. Common bile duct is not well-defined in the absence of IV contrast. Pancreas: Not well assessed on the current exam. There is no evidence of pancreatic inflammation. More detailed assessment is limited. Spleen: No splenomegaly with spleen spanning 22 cm cranial caudal. No focal splenic abnormality on this unenhanced exam. Adrenals/Urinary Tract: The adrenal glands are poorly defined. Left kidney is displaced anterior inferiorly by splenomegaly. Probable cyst in the lower right kidney. There is no hydronephrosis. The urinary bladder is  partially distended. Stomach/Bowel: Bowel evaluation is limited in the absence of enteric contrast and patient motion artifact. There is no evidence of bowel obstruction or inflammation. Mild colonic diverticulosis without focal diverticulitis. The appendix is normal. Vascular/Lymphatic: Aortic atherosclerosis. Patient's known retroperitoneal and hepatic duodenal adenopathy is difficult to accurately delineate on this unenhanced exam. There is a 15 mm left periaortic node in the upper abdomen. Reproductive: The uterus is not seen, presumably surgically absent. Other: Generalized body wall edema. There is no ascites or free air. Small fat containing umbilical hernia. Musculoskeletal: There are no acute or suspicious osseous abnormalities. IMPRESSION: 1. Diminished left pleural effusion from prior PET. No evidence of pneumonia. 2. Thoracic adenopathy is similar to prior PET. Patient's known retroperitoneal and hepatic duodenal adenopathy is difficult to accurately delineate on the current exam. Again seen massive splenomegaly. 3. Cholelithiasis without evidence of acute cholecystitis. There may be periportal edema, which is not well assessed on this unenhanced exam. Common  bile duct is not delineated. 4. Colonic diverticulosis without diverticulitis. 5. Generalized body wall edema. Aortic Atherosclerosis (ICD10-I70.0). Electronically Signed   By: Keith Rake M.D.   On: 03/22/2022 01:08   DG Chest Port 1 View  Result Date: 03/21/2022 CLINICAL DATA:  Questionable sepsis - evaluate for abnormality Fever.  Lethargy. EXAM: PORTABLE CHEST 1 VIEW COMPARISON:  Radiograph 03/10/2022 FINDINGS: Right chest port remains in place. Persistent low lung volumes. Stable heart size and mediastinal contours. Aortic atherosclerosis. No focal airspace disease, pleural effusion, or pneumothorax. No acute osseous findings. IMPRESSION: Low lung volumes without acute findings. Electronically Signed   By: Keith Rake M.D.   On:  03/21/2022 21:42   DG Chest 1 View  Result Date: 03/10/2022 CLINICAL DATA:  Right-sided Port-A-Cath placement EXAM: CHEST  1 VIEW COMPARISON:  03/10/2021 FINDINGS: Cardiomegaly. Right chest port catheter, tip positioned over the superior vena cava. Both lungs are clear. The visualized skeletal structures are unremarkable. IMPRESSION: 1.  Cardiomegaly without acute abnormality of the lungs. 2. Right chest port catheter, tip positioned over the superior vena cava. Electronically Signed   By: Delanna Ahmadi M.D.   On: 03/10/2022 09:52   DG C-Arm 1-60 Min-No Report  Result Date: 03/10/2022 Fluoroscopy was utilized by the requesting physician.  No radiographic interpretation.     Discharge Exam: Vitals:   03/27/22 0456 03/27/22 1347  BP: (!) 121/106 112/60  Pulse: 86 91  Resp: 20 20  Temp: 98 F (36.7 C) 98.7 F (37.1 C)  SpO2: 100% 100%   Vitals:   03/26/22 0532 03/26/22 2243 03/27/22 0456 03/27/22 1347  BP: (!) 135/46  (!) 121/106 112/60  Pulse: 78 91 86 91  Resp: '19  20 20  '$ Temp: 98.5 F (36.9 C)  98 F (36.7 C) 98.7 F (37.1 C)  TempSrc:      SpO2: 100%  100% 100%  Weight:      Height:        General: Pt is alert and awake and comfortable but confused. Cardiovascular: RRR, S1/S2 +, no rubs, no gallops Respiratory: CTA bilaterally, no wheezing, no rhonchi Abdominal: Soft, NT, ND, bowel sounds + Extremities: no edema, no cyanosis    The results of significant diagnostics from this hospitalization (including imaging, microbiology, ancillary and laboratory) are listed below for reference.     Microbiology: Recent Results (from the past 240 hour(s))  Urine Culture     Status: None   Collection Time: 03/21/22  9:23 PM   Specimen: In/Out Cath Urine  Result Value Ref Range Status   Specimen Description   Final    IN/OUT CATH URINE Performed at [facility], [address] [place], [place] [address]    Special Requests   Final    NONE Performed at [facility],  [address] [place], [place] [address]    Culture   Final    NO GROWTH Performed at [facility], [place] [address] [place], [place] [address]    Report Status 03/23/2022 FINAL  Final  Resp Panel by RT-PCR (Flu A&B, Covid) Urine, In & Out Cath     Status: None   Collection Time: 03/21/22  9:59 PM   Specimen: Urine, In & Out Cath; Nasal Swab  Result Value Ref Range Status   SARS Coronavirus 2 by RT PCR NEGATIVE NEGATIVE Final    Comment: (NOTE) SARS-CoV-2 target nucleic acids are NOT DETECTED.  The SARS-CoV-2 RNA is generally detectable in upper respiratory specimens during the acute phase  of infection. The lowest concentration of SARS-CoV-2 viral copies this assay can detect is 138 copies/mL. A negative result does not preclude SARS-Cov-2 infection and should not be used as the sole basis for treatment or other patient management decisions. A negative result may occur with  improper specimen collection/handling, submission of specimen other than nasopharyngeal swab, presence of viral mutation(s) within the areas targeted by this assay, and inadequate number of viral copies(<138 copies/mL). A negative result must be combined with clinical observations, patient history, and epidemiological information. The expected result is Negative.  Fact Sheet for Patients:  EntrepreneurPulse.com.au  Fact Sheet for Healthcare Providers:  IncredibleEmployment.be  This test is no t yet approved or cleared by the [place] FDA and  has been authorized for detection and/or diagnosis of SARS-CoV-2 by FDA under an Emergency Use Authorization (EUA). This EUA will remain  in effect (meaning this test can be used) for the duration of the COVID-19 declaration under Section 564(b)(1) of the Act, 21 U.S.C.section 360bbb-3(b)(1), unless the authorization is terminated  or revoked sooner.       Influenza A by PCR NEGATIVE NEGATIVE Final   Influenza B by PCR  NEGATIVE NEGATIVE Final    Comment: (NOTE) The Xpert Xpress SARS-CoV-2/FLU/RSV plus assay is intended as an aid in the diagnosis of influenza from Nasopharyngeal swab specimens and should not be used as a sole basis for treatment. Nasal washings and aspirates are unacceptable for Xpert Xpress SARS-CoV-2/FLU/RSV testing.  Fact Sheet for Patients: EntrepreneurPulse.com.au  Fact Sheet for Healthcare Providers: IncredibleEmployment.be  This test is not yet approved or cleared by the [place] FDA and has been authorized for detection and/or diagnosis of SARS-CoV-2 by FDA under an Emergency Use Authorization (EUA). This EUA will remain in effect (meaning this test can be used) for the duration of the COVID-19 declaration under Section 564(b)(1) of the Act, 21 U.S.C. section 360bbb-3(b)(1), unless the authorization is terminated or revoked.  Performed at [facility], [address], [place], [place] [address]   Blood Culture (routine x 2)     Status: None   Collection Time: 03/21/22 10:07 PM   Specimen: Site Not Specified; Blood  Result Value Ref Range Status   Specimen Description   Final    SITE NOT SPECIFIED BOTTLES DRAWN AEROBIC AND ANAEROBIC   Special Requests Blood Culture adequate volume  Final   Culture   Final    NO GROWTH 5 DAYS Performed at [facility], [address], [place], [place] [address]    Report Status 03/26/2022 FINAL  Final  Blood Culture (routine x 2)     Status: None   Collection Time: 03/21/22 10:11 PM   Specimen: Site Not Specified; Blood  Result Value Ref Range Status   Specimen Description   Final    SITE NOT SPECIFIED BOTTLES DRAWN AEROBIC AND ANAEROBIC   Special Requests Blood Culture adequate volume  Final   Culture   Final    NO GROWTH 5 DAYS Performed at [facility], [address], [place],  [address]    Report Status 03/26/2022 FINAL  Final  KOH prep     Status: None   Collection Time:  03/25/22  1:02 PM   Specimen: PATH Cytology brushing; Body Fluid  Result Value Ref Range Status   Specimen Description ESOPHAGUS  Final   Special Requests NONE  Final   KOH Prep   Final    BUDDING YEAST SEEN Performed at [facility], [address], [place], [place]  [address]    Report Status 03/25/2022 FINAL  Final  Gastrointestinal Panel by PCR , Stool     Status: None   Collection Time: 03/26/22  3:00 PM   Specimen: Stool  Result Value Ref Range Status   Campylobacter species NOT DETECTED NOT DETECTED Final   Plesimonas shigelloides NOT DETECTED NOT DETECTED Final   Salmonella species NOT DETECTED NOT DETECTED Final   Yersinia enterocolitica NOT DETECTED NOT DETECTED Final   Vibrio species NOT DETECTED NOT DETECTED Final   Vibrio cholerae NOT DETECTED NOT DETECTED Final   Enteroaggregative E coli (EAEC) NOT DETECTED NOT DETECTED Final   Enteropathogenic E coli (EPEC) NOT DETECTED NOT DETECTED Final   Enterotoxigenic E coli (ETEC) NOT DETECTED NOT DETECTED Final   Shiga like toxin producing E coli (STEC) NOT DETECTED NOT DETECTED Final   Shigella/Enteroinvasive E coli (EIEC) NOT DETECTED NOT DETECTED Final   Cryptosporidium NOT DETECTED NOT DETECTED Final   Cyclospora cayetanensis NOT DETECTED NOT DETECTED Final   Entamoeba histolytica NOT DETECTED NOT DETECTED Final   Giardia lamblia NOT DETECTED NOT DETECTED Final   Adenovirus F40/41 NOT DETECTED NOT DETECTED Final   Astrovirus NOT DETECTED NOT DETECTED Final   Norovirus GI/GII NOT DETECTED NOT DETECTED Final   Rotavirus A NOT DETECTED NOT DETECTED Final   Sapovirus (I, II, IV, and V) NOT DETECTED NOT DETECTED Final    Comment: Performed at [facility], [address] [place], [place] [address]     Labs: BNP (last 3 results) No results for input(s): "BNP" in the last 8760 hours. Basic Metabolic Panel: Recent Labs  Lab 03/22/22 0716 03/23/22 0423 03/24/22 0440 03/25/22 0458 03/26/22 0433  NA 142 146* 145  147* 147*  K 4.7 4.6 4.4 4.0 3.9  CL 114* 114* 116* 118* 122*  CO2 23 23 21* 20* 18*  GLUCOSE 96 83 91 105* 98  BUN 61* 70* 72* 77* 86*  CREATININE 2.06* 2.29* 2.43* 2.85* 3.32*  CALCIUM 7.5* 7.7* 7.6* 7.7* 8.0*  MG 2.5*  --   --   --   --   PHOS 2.7  --   --   --   --    Liver Function Tests: Recent Labs  Lab 03/22/22 0716 03/22/22 1532 03/23/22 0423 03/24/22 0440 03/25/22 0458 03/26/22 0433  AST 39  --  44* 47* 43* 41  ALT 11  --  '12 12 13 15  '$ ALKPHOS 35*  --  33* 36* 31* 32*  BILITOT 2.0* 1.8* 1.6* 1.3* 1.4* 1.1  PROT 5.1*  --  4.4* 4.4* 4.1* 4.4*  ALBUMIN 3.1*  --  2.7* 2.5* 2.5* 2.6*   No results for input(s): "LIPASE", "AMYLASE" in the last 168 hours. Recent Labs  Lab 03/22/22 1656  AMMONIA 23   CBC: Recent Labs  Lab 03/23/22 0423 03/24/22 0440 03/25/22 0458 03/26/22 0433 03/27/22 0601  WBC 9.7 10.3 15.1* 20.0* 18.5*  NEUTROABS 2.0 1.9 3.5 5.2 1.9  HGB 8.5* 8.5* 8.0* 7.5* 7.5*  HCT 27.2* 27.4* 26.2* 25.4* 24.8*  MCV 103.0* 105.8* 108.3* 109.5* 109.3*  PLT 82* 79* 77* 83* 39*   Cardiac Enzymes: No results for input(s): "CKTOTAL", "CKMB", "CKMBINDEX", "TROPONINI" in the last 168 hours. BNP: Invalid input(s): "POCBNP" CBG: No results for input(s): "GLUCAP" in the last 168 hours. D-Dimer No results for input(s): "DDIMER" in the last 72 hours. Hgb A1c No results for input(s): "HGBA1C" in the last 72 hours. Lipid Profile No results for input(s): "CHOL", "HDL", "LDLCALC", "TRIG", "  CHOLHDL", "LDLDIRECT" in the last 72 hours. Thyroid function studies No results for input(s): "TSH", "T4TOTAL", "T3FREE", "THYROIDAB" in the last 72 hours.  Invalid input(s): "FREET3" Anemia work up No results for input(s): "VITAMINB12", "FOLATE", "FERRITIN", "TIBC", "IRON", "RETICCTPCT" in the last 72 hours. Urinalysis    Component Value Date/Time   COLORURINE AMBER (A) 03/21/2022 2123   APPEARANCEUR CLOUDY (A) 03/21/2022 2123   APPEARANCEUR Clear 02/04/2022 1506    LABSPEC 1.017 03/21/2022 2123   PHURINE 5.0 03/21/2022 2123   GLUCOSEU NEGATIVE 03/21/2022 2123   HGBUR NEGATIVE 03/21/2022 2123   BILIRUBINUR NEGATIVE 03/21/2022 2123   BILIRUBINUR Negative 02/04/2022 1506   KETONESUR 5 (A) 03/21/2022 2123   PROTEINUR 100 (A) 03/21/2022 2123   UROBILINOGEN 0.2 08/01/2015 1144   NITRITE NEGATIVE 03/21/2022 2123   LEUKOCYTESUR NEGATIVE 03/21/2022 2123   Sepsis Labs Recent Labs  Lab 03/24/22 0440 03/25/22 0458 03/26/22 0433 03/27/22 0601  WBC 10.3 15.1* 20.0* 18.5*   Microbiology Recent Results (from the past 240 hour(s))  Urine Culture     Status: None   Collection Time: 03/21/22  9:23 PM   Specimen: In/Out Cath Urine  Result Value Ref Range Status   Specimen Description   Final    IN/OUT CATH URINE Performed at [facility], [address], [place], [place] [address]    Special Requests   Final    NONE Performed at [facility], [address] [place], [place] [address]    Culture   Final    NO GROWTH Performed at [facility], [place] [address], [place], [place] [address]    Report Status 03/23/2022 FINAL  Final  Resp Panel by RT-PCR (Flu A&B, Covid) Urine, In & Out Cath     Status: None   Collection Time: 03/21/22  9:59 PM   Specimen: Urine, In & Out Cath; Nasal Swab  Result Value Ref Range Status   SARS Coronavirus 2 by RT PCR NEGATIVE NEGATIVE Final    Comment: (NOTE) SARS-CoV-2 target nucleic acids are NOT DETECTED.  The SARS-CoV-2 RNA is generally detectable in upper respiratory specimens during the acute phase of infection. The lowest concentration of SARS-CoV-2 viral copies this assay can detect is 138 copies/mL. A negative result does not preclude SARS-Cov-2 infection and should not be used as the sole basis for treatment or other patient management decisions. A negative result may occur with  improper specimen collection/handling, submission of specimen other than nasopharyngeal swab, presence of viral mutation(s)  within the areas targeted by this assay, and inadequate number of viral copies(<138 copies/mL). A negative result must be combined with clinical observations, patient history, and epidemiological information. The expected result is Negative.  Fact Sheet for Patients:  EntrepreneurPulse.com.au  Fact Sheet for Healthcare Providers:  IncredibleEmployment.be  This test is no t yet approved or cleared by the [place] FDA and  has been authorized for detection and/or diagnosis of SARS-CoV-2 by FDA under an Emergency Use Authorization (EUA). This EUA will remain  in effect (meaning this test can be used) for the duration of the COVID-19 declaration under Section 564(b)(1) of the Act, 21 U.S.C.section 360bbb-3(b)(1), unless the authorization is terminated  or revoked sooner.       Influenza A by PCR NEGATIVE NEGATIVE Final   Influenza B by PCR NEGATIVE NEGATIVE Final    Comment: (NOTE) The Xpert Xpress SARS-CoV-2/FLU/RSV plus assay is intended as an aid in the diagnosis of influenza from Nasopharyngeal swab specimens and should not be used as a  sole basis for treatment. Nasal washings and aspirates are unacceptable for Xpert Xpress SARS-CoV-2/FLU/RSV testing.  Fact Sheet for Patients: EntrepreneurPulse.com.au  Fact Sheet for Healthcare Providers: IncredibleEmployment.be  This test is not yet approved or cleared by the [place] FDA and has been authorized for detection and/or diagnosis of SARS-CoV-2 by FDA under an Emergency Use Authorization (EUA). This EUA will remain in effect (meaning this test can be used) for the duration of the COVID-19 declaration under Section 564(b)(1) of the Act, 21 U.S.C. section 360bbb-3(b)(1), unless the authorization is terminated or revoked.  Performed at [facility], [address], [place], [place] [address]   Blood Culture (routine x 2)     Status: None    Collection Time: 03/21/22 10:07 PM   Specimen: Site Not Specified; Blood  Result Value Ref Range Status   Specimen Description   Final    SITE NOT SPECIFIED BOTTLES DRAWN AEROBIC AND ANAEROBIC   Special Requests Blood Culture adequate volume  Final   Culture   Final    NO GROWTH 5 DAYS Performed at [facility], [address] [place], [place] [address]    Report Status 03/26/2022 FINAL  Final  Blood Culture (routine x 2)     Status: None   Collection Time: 03/21/22 10:11 PM   Specimen: Site Not Specified; Blood  Result Value Ref Range Status   Specimen Description   Final    SITE NOT SPECIFIED BOTTLES DRAWN AEROBIC AND ANAEROBIC   Special Requests Blood Culture adequate volume  Final   Culture   Final    NO GROWTH 5 DAYS Performed at [facility], [address] [place], [place] [address]    Report Status 03/26/2022 FINAL  Final  KOH prep     Status: None   Collection Time: 03/25/22  1:02 PM   Specimen: PATH Cytology brushing; Body Fluid  Result Value Ref Range Status   Specimen Description ESOPHAGUS  Final   Special Requests NONE  Final   KOH Prep   Final    BUDDING YEAST SEEN Performed at [facility], [address] [place], [place] [address]    Report Status 03/25/2022 FINAL  Final  Gastrointestinal Panel by PCR , Stool     Status: None   Collection Time: 03/26/22  3:00 PM   Specimen: Stool  Result Value Ref Range Status   Campylobacter species NOT DETECTED NOT DETECTED Final   Plesimonas shigelloides NOT DETECTED NOT DETECTED Final   Salmonella species NOT DETECTED NOT DETECTED Final   Yersinia enterocolitica NOT DETECTED NOT DETECTED Final   Vibrio species NOT DETECTED NOT DETECTED Final   Vibrio cholerae NOT DETECTED NOT DETECTED Final   Enteroaggregative E coli (EAEC) NOT DETECTED NOT DETECTED Final   Enteropathogenic E coli (EPEC) NOT DETECTED NOT DETECTED Final   Enterotoxigenic E coli (ETEC) NOT DETECTED NOT DETECTED Final   Shiga like toxin producing E coli  (STEC) NOT DETECTED NOT DETECTED Final   Shigella/Enteroinvasive E coli (EIEC) NOT DETECTED NOT DETECTED Final   Cryptosporidium NOT DETECTED NOT DETECTED Final   Cyclospora cayetanensis NOT DETECTED NOT DETECTED Final   Entamoeba histolytica NOT DETECTED NOT DETECTED Final   Giardia lamblia NOT DETECTED NOT DETECTED Final   Adenovirus F40/41 NOT DETECTED NOT DETECTED Final   Astrovirus NOT DETECTED NOT DETECTED Final   Norovirus GI/GII NOT DETECTED NOT DETECTED Final   Rotavirus A NOT DETECTED NOT DETECTED Final   Sapovirus (I, II, IV, and V) NOT DETECTED NOT DETECTED  Final    Comment: Performed at [facility], [address] [place], [place] [address]     Time coordinating discharge: Over 30 minutes  SIGNED:   Darliss Cheney, MD  [facility] 03/28/2022, 8:49 AM *Please note that this is a verbal dictation therefore any spelling or grammatical errors are due to the "[place]" system interpretation. If 7PM-7AM, please contact night-coverage [email or web address]

## 2022-03-29 ENCOUNTER — Other Ambulatory Visit: Payer: Self-pay | Admitting: Adult Health

## 2022-03-29 ENCOUNTER — Encounter: Payer: Self-pay | Admitting: Adult Health

## 2022-03-29 ENCOUNTER — Non-Acute Institutional Stay (SKILLED_NURSING_FACILITY): Payer: Medicare Other | Admitting: Adult Health

## 2022-03-29 ENCOUNTER — Telehealth: Payer: Self-pay

## 2022-03-29 DIAGNOSIS — C8318 Mantle cell lymphoma, lymph nodes of multiple sites: Secondary | ICD-10-CM

## 2022-03-29 DIAGNOSIS — R627 Adult failure to thrive: Secondary | ICD-10-CM

## 2022-03-29 DIAGNOSIS — D696 Thrombocytopenia, unspecified: Secondary | ICD-10-CM | POA: Diagnosis not present

## 2022-03-29 MED ORDER — MORPHINE SULFATE (CONCENTRATE) 20 MG/ML PO SOLN: 5.0000 mg | ORAL | 0 refills | Status: AC | PRN

## 2022-03-29 MED ORDER — LORAZEPAM 2 MG/ML PO CONC: 0.5000 mg | ORAL | 0 refills | Status: AC | PRN

## 2022-03-29 NOTE — Progress Notes (Signed)
Location:  [place] Room Number: 141-W Place of Service:  SNF (31)   CODE STATUS: DNR  Allergies  Allergen Reactions   Lidocaine Other (See Comments) and Hypertension    Patient had chest tightness with stomach pain, numbness on left side of limbs and unable to speak  PT received lidocaine 03-10-22 for port insertion with no reaction   Rituximab-Pvvr Other (See Comments)    Patient complained of chest pressure/pain     Chief Complaint  Patient presents with   Hospitalization Follow-up    HPI:  She is a 88 year old long term resident of this facility who has been hospitalized from 03-21-22 through 03-28-22. Her medical history includes stage IV mantle cell lymphoma; stage 3b chronic renal failure. She presented to the ED with fever and weakness. Occult blood was positive. The CT of chest; abdomen and pelvis showed diminished left pleural effusion; no evidence of pneumonia. She was treated with IV vancomycin; cefepime; flagyl.  Sepsis secondary to UTI; likely source of infection. Chest xr was negative. Ct chest abdomen and pelvis negative for acuate pathology.    2. Acute on chronic blood loss anemia; upper GI bleed/candidal esophagitis. She did receive 2 units PRBC. The FOBT was positive; hgb increased to 8; is drifting down to 7.5. upper went EGD and colonoscopy 03-25-22: hemorrhoids; diverticulosis no bleeding. EGD: candidal esophagitis; non bleeding gastritis.    3. Stage IVb mantle cell lymphoma: did receive 1 cycle of bendamustine and rituximab on 02-18-22; the second cycle was planned for 03-18-22 she is too weak to receive.   The goal of her care is comfort only. She is not verbally responsive at this time. She is due to be seen by hospice care. Will need to stop her medications. Her family has verbalized understanding.     Past Medical History:  Diagnosis Date   Anxiety    Cancer ([place])    GAD (generalized anxiety disorder)    Hypertension    IBS  (irritable bowel syndrome)    Irritable bowel disease    Osteoarthritis    Renal disorder     Past Surgical History:  Procedure Laterality Date   ABDOMINAL HYSTERECTOMY  1974   BIOPSY  03/25/2022   Procedure: BIOPSY;  Surgeon: [name], DO;  [facility];  Service: Endoscopy;;   CARDIAC CATHETERIZATION     1991 and 2005   COLONOSCOPY WITH PROPOFOL N/A 03/25/2022   Procedure: COLONOSCOPY WITH PROPOFOL;  Surgeon: [name], DO;  [facility];  Service: Endoscopy;  Laterality: N/A;   ESOPHAGEAL BRUSHING  03/25/2022   Procedure: ESOPHAGEAL BRUSHING;  Surgeon: [name], DO;  [facility];  Service: Endoscopy;;   ESOPHAGOGASTRODUODENOSCOPY (EGD) WITH PROPOFOL N/A 03/25/2022   Procedure: ESOPHAGOGASTRODUODENOSCOPY (EGD) WITH PROPOFOL;  Surgeon: [name], DO;  [facility];  Service: Endoscopy;  Laterality: N/A;   PORTACATH PLACEMENT Right 03/10/2022   Procedure: INSERTION PORT-A-CATH, RIGHT IJ;  Surgeon: Rusty Aus, DO;  Location: [facility] ORS;  Service: General;  Laterality: Right;    Social History   Socioeconomic History   Marital status: Widowed    Spouse name: Not on file   Number of children: Not on file   Years of education: Not on file   Highest education level: Not on file  Occupational History   Not on file  Tobacco Use   Smoking status: Never   Smokeless tobacco: Never  Vaping Use  Vaping Use: Never used  Substance and Sexual Activity   Alcohol use: No   Drug use: No   Sexual activity: Not on file  Other Topics Concern   Not on file  Social History Narrative   Not on file   Social Determinants of Health   Financial Resource Strain: Low Risk  (05/11/2021)   Overall Financial Resource Strain (CARDIA)    Difficulty of Paying Living Expenses: Not very hard  Food Insecurity: No Food Insecurity (05/11/2021)   Hunger Vital Sign    Worried About Running Out of Food in the Last Year: Never  true    Ran Out of Food in the Last Year: Never true  Transportation Needs: No Transportation Needs (05/11/2021)   PRAPARE - Hydrologist (Medical): No    Lack of Transportation (Non-Medical): No  Physical Activity: Insufficiently Active (05/11/2021)   Exercise Vital Sign    Days of Exercise per Week: 2 days    Minutes of Exercise per Session: 10 min  Stress: Stress Concern Present (05/11/2021)   [place]    Feeling of Stress : To some extent  Social Connections: Moderately Integrated (05/11/2021)   Social Connection and Isolation Panel [NHANES]    Frequency of Communication with Friends and Family: More than three times a week    Frequency of Social Gatherings with Friends and Family: Twice a week    Attends Religious Services: More than 4 times per year    Active Member of [employer] or Organizations: Yes    Attends Archivist Meetings: 1 to 4 times per year    Marital Status: Widowed  Intimate Partner Violence: Not At Risk (05/11/2021)   Humiliation, Afraid, Rape, and Kick questionnaire    Fear of Current or Ex-Partner: No    Emotionally Abused: No    Physically Abused: No    Sexually Abused: No   Family History  Problem Relation Age of Onset   Renal Disease Sister    Multiple myeloma Sister       VITAL SIGNS BP 132/72   Pulse 93   Temp 97.8 F (36.6 C)   Resp 20   Ht $R'5\' 4"'xl$  (1.626 m)   Wt 150 lb 9.6 oz (68.3 kg)   SpO2 99%   BMI 25.85 kg/m   Outpatient Encounter Medications as of 03/29/2022  Medication Sig   [DISCONTINUED] LORazepam (ATIVAN) 0.5 MG tablet Take 0.5 mg by mouth 2 (two) times daily.   [DISCONTINUED] allopurinol (ZYLOPRIM) 300 MG tablet Take 1 tablet (300 mg total) by mouth daily. (Patient taking differently: Take 200 mg by mouth daily.)   [DISCONTINUED] diphenhydrAMINE (BENADRYL) 25 MG tablet Take 25 mg by mouth every 6 (six) hours as needed for allergies  or itching.   [DISCONTINUED] dronabinol (MARINOL) 2.5 MG capsule Take 1 capsule (2.5 mg total) by mouth daily before lunch.   [DISCONTINUED] Ensure Max Protein (ENSURE MAX PROTEIN) LIQD due to increased protein/energy needs related to cancer treatments Three Times A Day   [DISCONTINUED] furosemide (LASIX) 20 MG tablet Take 20 mg by mouth daily.   [DISCONTINUED] metoprolol tartrate (LOPRESSOR) 25 MG tablet Take 25 mg by mouth 2 (two) times daily.   [DISCONTINUED] mirtazapine (REMERON) 7.5 MG tablet Take 7.5 mg by mouth at bedtime.   [DISCONTINUED] nystatin (MYCOSTATIN) 100000 UNIT/ML suspension Take 5 mLs (500,000 Units total) by mouth 4 (four) times daily for 10 days.   [DISCONTINUED] omeprazole (PRILOSEC) 40  MG capsule Take 1 capsule (40 mg total) by mouth 2 (two) times daily.   [DISCONTINUED] sennosides-docusate sodium (SENOKOT-S) 8.6-50 MG tablet Take 1 tablet by mouth in the morning and at bedtime.   No facility-administered encounter medications on file as of 03/29/2022.     SIGNIFICANT DIAGNOSTIC EXAMS  EXAMS  03-25-22: Upper endoscopy:  - Candidiasis esophagitis with no bleeding. Cells for cytology obtained. - Gastritis. Biopsied. - Normal duodenal bulb, first portion of the duodenum and second portion of the duodenum.  03-25-22: colonoscopy  - Preparation of the colon was fair. - Non-bleeding internal hemorrhoids. - Diverticulosis in the sigmoid colon and in the descending colon.   LAB REVIEWED:   02-18-22: wbc 61.7; hgb 7.7; hct 23.5; mcv 102.6 plt 157; glucose 110; bun 20; creat 1.35; k+ 4.3; na++ 132; ca 8.3; GFR 3.8; mag 2.0 phos 3.6; protein 6.0; albumin 3.5 uric acid 5.5  03-26-22: glucose 98; bun 86; creat 3.32; k+ 3.9; na++ 147; ca 8.0; gfr 13; protein 4.4; albumin 2.6 03-27-22; wbc 18.5 hgb 7.5; hct 24.8; mcv 109.3 plt 39   Review of Systems  Reason unable to perform ROS: not responding.   Physical Exam Constitutional:      Appearance: Normal appearance.   Cardiovascular:     Rate and Rhythm: Normal rate and regular rhythm.     Pulses: Normal pulses.  Pulmonary:     Effort: Pulmonary effort is normal.     Breath sounds: Normal breath sounds.  Abdominal:     General: Bowel sounds are normal. There is no distension.  Musculoskeletal:     Cervical back: Neck supple.     Right lower leg: No edema.     Left lower leg: No edema.  Neurological:     Comments: Is resting quietly; no indications of distress present       ASSESSMENT/ PLAN:  TODAY  Failure to thrive in adult Mantle cell lymphoma of lymph nodes of multiple regions Thrombocytopenia  Will begin ativan concentrate: 0.5 mg every 4 hours as needed Will begin roxanol 5 mg every 2 hours as needed for pain or distress  She is now being followed by hospice care the focus of her care is comfort     Ok Edwards NP [facility]  call [phone]

## 2022-03-29 NOTE — Telephone Encounter (Signed)
Transition Care Management Unsuccessful Follow-up Telephone Call  Date of discharge and from where:  03/28/2022  Attempts:  1st Attempt  Reason for unsuccessful TCM follow-up call:  Unable to be reached ;due to release into a skilled nursing facility.

## 2022-03-30 DIAGNOSIS — R627 Adult failure to thrive: Secondary | ICD-10-CM | POA: Insufficient documentation

## 2022-03-30 LAB — SURGICAL PATHOLOGY

## 2022-03-31 ENCOUNTER — Ambulatory Visit (HOSPITAL_COMMUNITY): Payer: Medicare Other | Admitting: Hematology

## 2022-03-31 ENCOUNTER — Non-Acute Institutional Stay (SKILLED_NURSING_FACILITY): Admitting: Internal Medicine

## 2022-03-31 ENCOUNTER — Other Ambulatory Visit (HOSPITAL_COMMUNITY): Payer: Medicare Other

## 2022-03-31 ENCOUNTER — Encounter: Payer: Self-pay | Admitting: Internal Medicine

## 2022-03-31 DIAGNOSIS — K2900 Acute gastritis without bleeding: Secondary | ICD-10-CM | POA: Diagnosis not present

## 2022-03-31 DIAGNOSIS — N189 Chronic kidney disease, unspecified: Secondary | ICD-10-CM

## 2022-03-31 DIAGNOSIS — B3781 Candidal esophagitis: Secondary | ICD-10-CM | POA: Diagnosis not present

## 2022-03-31 DIAGNOSIS — N179 Acute kidney failure, unspecified: Secondary | ICD-10-CM | POA: Diagnosis not present

## 2022-03-31 DIAGNOSIS — R6510 Systemic inflammatory response syndrome (SIRS) of non-infectious origin without acute organ dysfunction: Secondary | ICD-10-CM | POA: Diagnosis not present

## 2022-03-31 NOTE — Assessment & Plan Note (Signed)
Antibiotic course completed.

## 2022-03-31 NOTE — Assessment & Plan Note (Addendum)
As not HD candidate DNR .

## 2022-03-31 NOTE — Assessment & Plan Note (Addendum)
Antifungal D/Ced as now on Hospice

## 2022-03-31 NOTE — Progress Notes (Signed)
NURSING HOME LOCATION: [facility] ROOM NUMBER:  [place]:  DNR  PCP:  Ok Edwards NP,PSC  This is a nursing facility follow up visit for [facility] readmission within 30 days  Interim medical record and care since last SNF visit was updated with review of diagnostic studies and change in clinical status since last visit were documented.  HPI: She was rehospitalized 6/4 - 03/28/2022 presenting from the SNF to the ED with generalized weakness and fever.  On presentation patient was profoundly somnolent although arousable.  She was unable to provide any history. She exhibited tachycardia and tachypnea and was febrile with a temperature of 102.2.  Leukocytosis was present; anemia had progressed from 7.9/24.7 down to 6.9/23.5.  Platelet count was 105,000.  Creatinine was 2.27; baseline creatinine was presumed to be 1.2-1.5.  Fecal occult blood testing was positive.  CT of the chest, abdomen, pelvis without contrast revealed that prior documented pleural effusion had decreased compared to PET scan findings. No HCAP present. Empirically she received Tylenol, IV Vanco and cefepime, and Flagyl. Likely source of infection was felt to be UTI as UA was cloudy with rare bacteria.  This was in the context of a rash after antibiotics were administered for UTI at the SNF. Blood and urine cultures were negative; she received total of 7 days of IV antibiotics. She received 2 units of packed red cells for the progressive anemia.  Posttransfusion hemoglobin was over 8 but began to drift down.  EGD and colonoscopy were performed 6/8.  Colonoscopy revealed only hemorrhoids and diverticulosis without active bleed.  EGD revealed Candidal esophagitis and nonbleeding gastritis.  Mycostatin was initiated by GI for total of 10 days. She had presented with acute toxic encephalopathy secondary to sepsis but this improved with rehydration and antibiotics only to recur.  After discussion  with the daughter comfort care was pursued as of 6/9.  DNR status was initiated 6/9.  Hospice involvement was to be pursued at the SNF. The patient has stage IVb mantle cell lymphoma and had received cycle 1 of Bendamustine and rituximab on 5/4.  Second cycle was deferred due to profound weakness and diarrhea. Creatinine progressed to 3.32 with a GFR of 13. Foley catheter was inserted.  Renal ultrasound revealed solid mass in the left lower pole of the left kidney.  Nephrology discussed with the daughter that due to advanced age and multiple advanced comorbidities she would not be a candidate for dialysis. Metoprolol was decreased to 12.5 mg twice a day. She was discharged back to the SNF for comfort care measures.  Review of systems could not be completed as she was essentially nonverbal.  She slept through most of exam and woke only enough to state in a garbled fashion" I want water."  Staff states that they are giving her sherbet; but she otherwise has been n.p.o.  Physical exam:  Pertinent or positive findings: As noted she was asleep during most of the exam.  When she did awake she exhibited ptosis on the right.  While asleep mouth was agape and she exhibited an intermittent mastication type tremor of the mandible.  She is missing multiple teeth.  Mouth is dry and teeth are coated.  She has keratotic facial freckling.  Marked tachycardia is present.  Breath sounds are shallow with suggestion of low-grade rales.  Abdomen is protuberant.  Bowel sounds are essentially absent.  Foley catheter is present.  Pedal pulses are not palpable.  Feet are cold  to touch.  The large toes are deviated laterally.  The right great toenail is deformed and the left is hyperpigmented.  General appearance: no increased work of breathing is present.   Lymphatic: No lymphadenopathy about the head, neck, axilla. Eyes: No conjunctival inflammation or lid edema is present. There is no scleral icterus. Ears:  External ear  exam shows no significant lesions or deformities.   Nose:  External nasal examination shows no deformity or inflammation. Nasal mucosa are pink and moist without lesions, exudates Neck:  No thyromegaly, masses, tenderness noted.    Heart:  No murmur, click, rub .  Lungs:  without wheezes, rhonchi, rubs. Abdomen: Bowel sounds are normal. Abdomen is soft and nontender with no organomegaly, hernias, masses. GU: Deferred  Extremities:  No cyanosis, clubbing, edema  Neurologic exam :Balance, Rhomberg, finger to nose testing could not be completed due to clinical state Skin: Warm & dry w/o tenting. No significant lesions or rash.  See summary under each active problem in the Problem List with associated updated therapeutic plan

## 2022-03-31 NOTE — Assessment & Plan Note (Addendum)
Now on Hospice, essentially unresponsive & taking only sherbert. PPI D/Ced

## 2022-03-31 NOTE — Patient Instructions (Signed)
See assessment and plan under each diagnosis in the problem list and acutely for this visit 

## 2022-04-02 LAB — PROCALCITONIN

## 2022-04-17 DEATH — deceased

## 2022-05-31 ENCOUNTER — Other Ambulatory Visit: Payer: Self-pay
